# Patient Record
Sex: Male | Born: 1950 | Race: White | Hispanic: No | Smoking: Former smoker
Health system: Southern US, Community
[De-identification: ages and names within clinical notes are randomized; demographics above are authoritative.]

## PROBLEM LIST (undated history)

## (undated) DIAGNOSIS — C801 Malignant (primary) neoplasm, unspecified: Secondary | ICD-10-CM

## (undated) DIAGNOSIS — I10 Essential (primary) hypertension: Secondary | ICD-10-CM

## (undated) DIAGNOSIS — J439 Emphysema, unspecified: Secondary | ICD-10-CM

## (undated) DIAGNOSIS — K279 Peptic ulcer, site unspecified, unspecified as acute or chronic, without hemorrhage or perforation: Secondary | ICD-10-CM

## (undated) DIAGNOSIS — I712 Thoracic aortic aneurysm, without rupture, unspecified: Secondary | ICD-10-CM

## (undated) DIAGNOSIS — I639 Cerebral infarction, unspecified: Secondary | ICD-10-CM

## (undated) DIAGNOSIS — R251 Tremor, unspecified: Secondary | ICD-10-CM

## (undated) DIAGNOSIS — R0600 Dyspnea, unspecified: Secondary | ICD-10-CM

## (undated) DIAGNOSIS — K449 Diaphragmatic hernia without obstruction or gangrene: Secondary | ICD-10-CM

## (undated) DIAGNOSIS — D649 Anemia, unspecified: Secondary | ICD-10-CM

## (undated) DIAGNOSIS — M1990 Unspecified osteoarthritis, unspecified site: Secondary | ICD-10-CM

## (undated) DIAGNOSIS — I219 Acute myocardial infarction, unspecified: Secondary | ICD-10-CM

## (undated) DIAGNOSIS — K219 Gastro-esophageal reflux disease without esophagitis: Secondary | ICD-10-CM

## (undated) DIAGNOSIS — E538 Deficiency of other specified B group vitamins: Secondary | ICD-10-CM

## (undated) DIAGNOSIS — J449 Chronic obstructive pulmonary disease, unspecified: Secondary | ICD-10-CM

## (undated) DIAGNOSIS — F419 Anxiety disorder, unspecified: Secondary | ICD-10-CM

## (undated) DIAGNOSIS — I2510 Atherosclerotic heart disease of native coronary artery without angina pectoris: Secondary | ICD-10-CM

## (undated) HISTORY — PX: STENT REMOVAL: SHX6421

## (undated) HISTORY — PX: BACK SURGERY: SHX140

## (undated) HISTORY — PX: OTHER SURGICAL HISTORY: SHX169

## (undated) HISTORY — PX: CORONARY ANGIOPLASTY: SHX604

---

## 2003-12-04 ENCOUNTER — Ambulatory Visit (HOSPITAL_COMMUNITY): Admission: AD | Admit: 2003-12-04 | Discharge: 2003-12-04 | Payer: Self-pay | Admitting: Neurological Surgery

## 2004-01-26 ENCOUNTER — Ambulatory Visit (HOSPITAL_COMMUNITY): Admission: RE | Admit: 2004-01-26 | Discharge: 2004-01-26 | Payer: Self-pay | Admitting: Neurological Surgery

## 2004-03-07 ENCOUNTER — Inpatient Hospital Stay (HOSPITAL_COMMUNITY): Admission: RE | Admit: 2004-03-07 | Discharge: 2004-03-13 | Payer: Self-pay | Admitting: Neurological Surgery

## 2004-05-06 ENCOUNTER — Ambulatory Visit (HOSPITAL_COMMUNITY): Admission: RE | Admit: 2004-05-06 | Discharge: 2004-05-06 | Payer: Self-pay | Admitting: Neurological Surgery

## 2006-02-21 ENCOUNTER — Inpatient Hospital Stay: Payer: Self-pay | Admitting: Internal Medicine

## 2006-02-21 ENCOUNTER — Other Ambulatory Visit: Payer: Self-pay

## 2008-01-07 ENCOUNTER — Ambulatory Visit: Payer: Self-pay | Admitting: Unknown Physician Specialty

## 2008-01-25 ENCOUNTER — Ambulatory Visit: Payer: Self-pay | Admitting: Internal Medicine

## 2008-03-15 ENCOUNTER — Ambulatory Visit: Payer: Self-pay | Admitting: Internal Medicine

## 2008-07-19 ENCOUNTER — Ambulatory Visit: Payer: Self-pay | Admitting: Neurological Surgery

## 2009-02-08 ENCOUNTER — Ambulatory Visit: Payer: Self-pay | Admitting: Unknown Physician Specialty

## 2011-01-04 ENCOUNTER — Encounter: Payer: Self-pay | Admitting: Neurological Surgery

## 2011-01-07 ENCOUNTER — Ambulatory Visit (HOSPITAL_COMMUNITY)
Admission: RE | Admit: 2011-01-07 | Discharge: 2011-01-07 | Payer: Self-pay | Source: Home / Self Care | Attending: Neurological Surgery | Admitting: Neurological Surgery

## 2011-01-28 ENCOUNTER — Inpatient Hospital Stay (HOSPITAL_COMMUNITY)
Admission: RE | Admit: 2011-01-28 | Payer: PRIVATE HEALTH INSURANCE | Source: Ambulatory Visit | Admitting: Neurological Surgery

## 2011-04-18 ENCOUNTER — Encounter (HOSPITAL_COMMUNITY)
Admission: RE | Admit: 2011-04-18 | Discharge: 2011-04-18 | Disposition: A | Discharge status: Home / Self Care | Payer: PRIVATE HEALTH INSURANCE | Source: Ambulatory Visit | Attending: Neurological Surgery | Admitting: Neurological Surgery

## 2011-04-18 LAB — CBC
HCT: 48 % (ref 39.0–52.0)
Hemoglobin: 16.6 g/dL (ref 13.0–17.0)
MCH: 33.5 pg (ref 26.0–34.0)
MCHC: 34.6 g/dL (ref 30.0–36.0)
MCV: 97 fL (ref 78.0–100.0)
Platelets: 245 10*3/uL (ref 150–400)
RBC: 4.95 MIL/uL (ref 4.22–5.81)
RDW: 12.4 % (ref 11.5–15.5)
WBC: 8.7 10*3/uL (ref 4.0–10.5)

## 2011-04-18 LAB — BASIC METABOLIC PANEL
BUN: 12 mg/dL (ref 6–23)
CO2: 33 mEq/L — ABNORMAL HIGH (ref 19–32)
Calcium: 9.7 mg/dL (ref 8.4–10.5)
Chloride: 94 mEq/L — ABNORMAL LOW (ref 96–112)
Creatinine, Ser: 0.8 mg/dL (ref 0.4–1.5)
GFR calc Af Amer: >60 mL/min (ref >60)
GFR calc non Af Amer: >60 mL/min (ref >60)
Glucose, Bld: 97 mg/dL (ref 70–99)
Potassium: 5.1 mEq/L (ref 3.5–5.1)
Sodium: 132 mEq/L — ABNORMAL LOW (ref 135–145)

## 2011-04-18 LAB — SURGICAL PCR SCREEN
MRSA, PCR: NEGATIVE
Staphylococcus aureus: NEGATIVE

## 2011-04-24 ENCOUNTER — Ambulatory Visit (HOSPITAL_COMMUNITY)
Admission: RE | Admit: 2011-04-24 | Discharge: 2011-04-24 | Disposition: A | Discharge status: Home / Self Care | Payer: PRIVATE HEALTH INSURANCE | Source: Ambulatory Visit | Attending: Neurological Surgery | Admitting: Neurological Surgery

## 2011-04-24 ENCOUNTER — Other Ambulatory Visit (HOSPITAL_COMMUNITY): Payer: Self-pay | Admitting: Neurological Surgery

## 2011-04-24 ENCOUNTER — Inpatient Hospital Stay (HOSPITAL_COMMUNITY)
Admission: RE | Admit: 2011-04-24 | Discharge: 2011-05-09 | DRG: 459 | Disposition: A | Discharge status: Rehabilitation Facility | Payer: PRIVATE HEALTH INSURANCE | Source: Ambulatory Visit | Attending: Neurological Surgery | Admitting: Neurological Surgery

## 2011-04-24 ENCOUNTER — Inpatient Hospital Stay (HOSPITAL_COMMUNITY): Payer: PRIVATE HEALTH INSURANCE

## 2011-04-24 DIAGNOSIS — M4326 Fusion of spine, lumbar region: Secondary | ICD-10-CM

## 2011-04-24 DIAGNOSIS — I739 Peripheral vascular disease, unspecified: Secondary | ICD-10-CM | POA: Diagnosis present

## 2011-04-24 DIAGNOSIS — D62 Acute posthemorrhagic anemia: Secondary | ICD-10-CM | POA: Diagnosis not present

## 2011-04-24 DIAGNOSIS — J9600 Acute respiratory failure, unspecified whether with hypoxia or hypercapnia: Secondary | ICD-10-CM | POA: Diagnosis not present

## 2011-04-24 DIAGNOSIS — R5383 Other fatigue: Secondary | ICD-10-CM | POA: Diagnosis not present

## 2011-04-24 DIAGNOSIS — E871 Hypo-osmolality and hyponatremia: Secondary | ICD-10-CM | POA: Diagnosis present

## 2011-04-24 DIAGNOSIS — I10 Essential (primary) hypertension: Secondary | ICD-10-CM | POA: Diagnosis present

## 2011-04-24 DIAGNOSIS — M47817 Spondylosis without myelopathy or radiculopathy, lumbosacral region: Secondary | ICD-10-CM | POA: Diagnosis present

## 2011-04-24 DIAGNOSIS — R1310 Dysphagia, unspecified: Secondary | ICD-10-CM | POA: Diagnosis not present

## 2011-04-24 DIAGNOSIS — I2510 Atherosclerotic heart disease of native coronary artery without angina pectoris: Secondary | ICD-10-CM | POA: Diagnosis present

## 2011-04-24 DIAGNOSIS — F17200 Nicotine dependence, unspecified, uncomplicated: Secondary | ICD-10-CM | POA: Diagnosis present

## 2011-04-24 DIAGNOSIS — B3781 Candidal esophagitis: Secondary | ICD-10-CM | POA: Diagnosis not present

## 2011-04-24 DIAGNOSIS — R5381 Other malaise: Secondary | ICD-10-CM | POA: Diagnosis not present

## 2011-04-24 DIAGNOSIS — K208 Other esophagitis without bleeding: Secondary | ICD-10-CM | POA: Diagnosis present

## 2011-04-24 DIAGNOSIS — Z882 Allergy status to sulfonamides status: Secondary | ICD-10-CM

## 2011-04-24 DIAGNOSIS — K560 Paralytic ileus: Secondary | ICD-10-CM | POA: Diagnosis not present

## 2011-04-24 DIAGNOSIS — Z951 Presence of aortocoronary bypass graft: Secondary | ICD-10-CM

## 2011-04-24 DIAGNOSIS — R0602 Shortness of breath: Secondary | ICD-10-CM | POA: Diagnosis not present

## 2011-04-24 DIAGNOSIS — Y838 Other surgical procedures as the cause of abnormal reaction of the patient, or of later complication, without mention of misadventure at the time of the procedure: Secondary | ICD-10-CM | POA: Diagnosis not present

## 2011-04-24 DIAGNOSIS — Z981 Arthrodesis status: Secondary | ICD-10-CM

## 2011-04-24 DIAGNOSIS — K929 Disease of digestive system, unspecified: Secondary | ICD-10-CM | POA: Diagnosis not present

## 2011-04-24 DIAGNOSIS — R5082 Postprocedural fever: Secondary | ICD-10-CM | POA: Diagnosis not present

## 2011-04-24 DIAGNOSIS — B009 Herpesviral infection, unspecified: Secondary | ICD-10-CM | POA: Diagnosis present

## 2011-04-24 DIAGNOSIS — J189 Pneumonia, unspecified organism: Secondary | ICD-10-CM | POA: Diagnosis not present

## 2011-04-24 DIAGNOSIS — M48061 Spinal stenosis, lumbar region without neurogenic claudication: Principal | ICD-10-CM | POA: Diagnosis present

## 2011-04-24 DIAGNOSIS — K219 Gastro-esophageal reflux disease without esophagitis: Secondary | ICD-10-CM | POA: Diagnosis present

## 2011-04-24 DIAGNOSIS — J441 Chronic obstructive pulmonary disease with (acute) exacerbation: Secondary | ICD-10-CM | POA: Diagnosis not present

## 2011-04-24 HISTORY — DX: Essential (primary) hypertension: I10

## 2011-04-24 LAB — ABO/RH
ABO/RH(D): A POS
ABO/RH(D): A POS

## 2011-04-24 LAB — TYPE AND SCREEN
ABO/RH(D): A POS
Antibody Screen: NEGATIVE

## 2011-04-25 LAB — CBC
HCT: 29.1 % — ABNORMAL LOW (ref 39.0–52.0)
Hemoglobin: 9.9 g/dL — ABNORMAL LOW (ref 13.0–17.0)
MCH: 33.4 pg (ref 26.0–34.0)
MCHC: 34 g/dL (ref 30.0–36.0)
MCV: 98.3 fL (ref 78.0–100.0)
Platelets: 142 10*3/uL — ABNORMAL LOW (ref 150–400)
RBC: 2.96 MIL/uL — ABNORMAL LOW (ref 4.22–5.81)
RDW: 12.6 % (ref 11.5–15.5)
WBC: 8.5 10*3/uL (ref 4.0–10.5)

## 2011-04-25 LAB — BASIC METABOLIC PANEL
BUN: 8 mg/dL (ref 6–23)
CO2: 32 mEq/L (ref 19–32)
Calcium: 7.2 mg/dL — ABNORMAL LOW (ref 8.4–10.5)
Chloride: 96 mEq/L (ref 96–112)
Creatinine, Ser: 0.74 mg/dL (ref 0.4–1.5)
GFR calc Af Amer: >60 mL/min (ref >60)
GFR calc non Af Amer: >60 mL/min (ref >60)
Glucose, Bld: 113 mg/dL — ABNORMAL HIGH (ref 70–99)
Potassium: 4.4 mEq/L (ref 3.5–5.1)
Sodium: 130 mEq/L — ABNORMAL LOW (ref 135–145)

## 2011-04-25 LAB — POCT I-STAT 4, (NA,K, GLUC, HGB,HCT)
Glucose, Bld: 92 mg/dL (ref 70–99)
HCT: 34 % — ABNORMAL LOW (ref 39.0–52.0)
Sodium: 134 mEq/L — ABNORMAL LOW (ref 135–145)

## 2011-04-28 ENCOUNTER — Inpatient Hospital Stay (HOSPITAL_COMMUNITY): Payer: PRIVATE HEALTH INSURANCE

## 2011-04-28 LAB — BLOOD GAS, ARTERIAL
Acid-Base Excess: 5.8 mmol/L — ABNORMAL HIGH (ref 0.0–2.0)
O2 Content: 2 L/min
pCO2 arterial: 46.2 mmHg — ABNORMAL HIGH (ref 35.0–45.0)
pH, Arterial: 7.429 (ref 7.350–7.450)
pO2, Arterial: 64.6 mmHg — ABNORMAL LOW (ref 80.0–100.0)

## 2011-04-28 LAB — BASIC METABOLIC PANEL
CO2: 34 mEq/L — ABNORMAL HIGH (ref 19–32)
Calcium: 9 mg/dL (ref 8.4–10.5)
Chloride: 87 mEq/L — ABNORMAL LOW (ref 96–112)
Glucose, Bld: 116 mg/dL — ABNORMAL HIGH (ref 70–99)
Sodium: 126 mEq/L — ABNORMAL LOW (ref 135–145)

## 2011-04-28 LAB — URINALYSIS, MICROSCOPIC ONLY
Glucose, UA: NEGATIVE mg/dL
Hgb urine dipstick: NEGATIVE
Leukocytes, UA: NEGATIVE
Protein, ur: NEGATIVE mg/dL
Specific Gravity, Urine: 1.016 (ref 1.005–1.030)
pH: 7.5 (ref 5.0–8.0)

## 2011-04-28 LAB — DIFFERENTIAL
Basophils Absolute: 0 10*3/uL (ref 0.0–0.1)
Basophils Relative: 0 % (ref 0–1)
Eosinophils Relative: 0 % (ref 0–5)
Lymphocytes Relative: 12 % (ref 12–46)
Monocytes Absolute: 0.8 10*3/uL (ref 0.1–1.0)
Monocytes Relative: 13 % — ABNORMAL HIGH (ref 3–12)

## 2011-04-28 LAB — CBC
HCT: 28.8 % — ABNORMAL LOW (ref 39.0–52.0)
MCH: 33.4 pg (ref 26.0–34.0)
MCHC: 34.4 g/dL (ref 30.0–36.0)
RDW: 12.4 % (ref 11.5–15.5)

## 2011-04-28 LAB — CARDIAC PANEL(CRET KIN+CKTOT+MB+TROPI)
CK, MB: 4.2 ng/mL — ABNORMAL HIGH (ref 0.3–4.0)
Relative Index: INVALID (ref 0.0–2.5)
Troponin I: <0.3 ng/mL (ref <0.30)

## 2011-04-28 LAB — SODIUM, URINE, RANDOM: Sodium, Ur: 134 mEq/L

## 2011-04-28 LAB — PRO B NATRIURETIC PEPTIDE: Pro B Natriuretic peptide (BNP): 786.9 pg/mL — ABNORMAL HIGH (ref 0–125)

## 2011-04-29 ENCOUNTER — Inpatient Hospital Stay (HOSPITAL_COMMUNITY): Payer: PRIVATE HEALTH INSURANCE

## 2011-04-29 ENCOUNTER — Encounter (HOSPITAL_COMMUNITY): Payer: Self-pay | Admitting: Radiology

## 2011-04-29 LAB — CARDIAC PANEL(CRET KIN+CKTOT+MB+TROPI)
CK, MB: 5.9 ng/mL — ABNORMAL HIGH (ref 0.3–4.0)
Relative Index: INVALID (ref 0.0–2.5)
Total CK: 78 U/L (ref 7–232)
Total CK: 83 U/L (ref 7–232)

## 2011-04-29 LAB — BASIC METABOLIC PANEL
BUN: 11 mg/dL (ref 6–23)
CO2: 32 mEq/L (ref 19–32)
Glucose, Bld: 100 mg/dL — ABNORMAL HIGH (ref 70–99)
Potassium: 5.2 mEq/L — ABNORMAL HIGH (ref 3.5–5.1)
Sodium: 125 mEq/L — ABNORMAL LOW (ref 135–145)

## 2011-04-29 LAB — CBC
HCT: 30.4 % — ABNORMAL LOW (ref 39.0–52.0)
MCH: 33.2 pg (ref 26.0–34.0)
MCV: 96.2 fL (ref 78.0–100.0)
RDW: 12.4 % (ref 11.5–15.5)
WBC: 6.2 10*3/uL (ref 4.0–10.5)

## 2011-04-29 LAB — URINE CULTURE
Colony Count: 50000
Culture  Setup Time: 201205142240

## 2011-04-29 LAB — IRON AND TIBC
Saturation Ratios: 11 % — ABNORMAL LOW (ref 20–55)
TIBC: 286 ug/dL (ref 215–435)
UIBC: 254 ug/dL

## 2011-04-29 LAB — C-REACTIVE PROTEIN: CRP: 14.2 mg/dL — ABNORMAL HIGH (ref <0.6)

## 2011-04-30 DIAGNOSIS — I059 Rheumatic mitral valve disease, unspecified: Secondary | ICD-10-CM

## 2011-04-30 LAB — BASIC METABOLIC PANEL
BUN: 17 mg/dL (ref 6–23)
BUN: 21 mg/dL (ref 6–23)
Calcium: 9.6 mg/dL (ref 8.4–10.5)
Creatinine, Ser: 0.49 mg/dL (ref 0.4–1.5)
Creatinine, Ser: 0.56 mg/dL (ref 0.4–1.5)
GFR calc non Af Amer: >60 mL/min (ref >60)
GFR calc non Af Amer: >60 mL/min (ref >60)
Glucose, Bld: 83 mg/dL (ref 70–99)
Glucose, Bld: 120 mg/dL — ABNORMAL HIGH (ref 70–99)

## 2011-05-01 LAB — BASIC METABOLIC PANEL
BUN: 21 mg/dL (ref 6–23)
BUN: 20 mg/dL (ref 6–23)
CO2: 32 mEq/L (ref 19–32)
CO2: 32 mEq/L (ref 19–32)
Calcium: 8.9 mg/dL (ref 8.4–10.5)
Chloride: 83 mEq/L — ABNORMAL LOW (ref 96–112)
Chloride: 83 mEq/L — ABNORMAL LOW (ref 96–112)
Creatinine, Ser: 0.49 mg/dL (ref 0.4–1.5)
Creatinine, Ser: 0.51 mg/dL (ref 0.4–1.5)
GFR calc Af Amer: >60 mL/min (ref >60)
GFR calc Af Amer: >60 mL/min (ref >60)
Potassium: 5.1 mEq/L (ref 3.5–5.1)
Potassium: 5 mEq/L (ref 3.5–5.1)
Sodium: 121 mEq/L — ABNORMAL LOW (ref 135–145)
Sodium: 120 mEq/L — ABNORMAL LOW (ref 135–145)

## 2011-05-01 LAB — COMPREHENSIVE METABOLIC PANEL
AST: 19 U/L (ref 0–37)
BUN: 21 mg/dL (ref 6–23)
CO2: 29 mEq/L (ref 19–32)
Calcium: 9 mg/dL (ref 8.4–10.5)
Chloride: 83 mEq/L — ABNORMAL LOW (ref 96–112)
Creatinine, Ser: 0.5 mg/dL (ref 0.4–1.5)
GFR calc Af Amer: >60 mL/min (ref >60)
GFR calc non Af Amer: >60 mL/min (ref >60)
Total Bilirubin: 0.5 mg/dL (ref 0.3–1.2)

## 2011-05-01 LAB — CBC
MCHC: 35.2 g/dL (ref 30.0–36.0)
Platelets: 353 10*3/uL (ref 150–400)
RDW: 12.5 % (ref 11.5–15.5)
WBC: 8.3 10*3/uL (ref 4.0–10.5)

## 2011-05-01 LAB — CLOSTRIDIUM DIFFICILE BY PCR: Toxigenic C. Difficile by PCR: NEGATIVE

## 2011-05-02 ENCOUNTER — Inpatient Hospital Stay (HOSPITAL_COMMUNITY): Payer: PRIVATE HEALTH INSURANCE

## 2011-05-02 LAB — BASIC METABOLIC PANEL
BUN: 15 mg/dL (ref 6–23)
CO2: 32 mEq/L (ref 19–32)
CO2: 29 mEq/L (ref 19–32)
Calcium: 8.9 mg/dL (ref 8.4–10.5)
Calcium: 8.6 mg/dL (ref 8.4–10.5)
Chloride: 85 mEq/L — ABNORMAL LOW (ref 96–112)
Creatinine, Ser: 0.49 mg/dL (ref 0.4–1.5)
Creatinine, Ser: <0.47 mg/dL (ref 0.4–1.5)
GFR calc Af Amer: >60 mL/min (ref >60)
GFR calc non Af Amer: >60 mL/min (ref >60)
Glucose, Bld: 105 mg/dL — ABNORMAL HIGH (ref 70–99)
Glucose, Bld: 104 mg/dL — ABNORMAL HIGH (ref 70–99)
Sodium: 122 mEq/L — ABNORMAL LOW (ref 135–145)

## 2011-05-02 LAB — CBC
HCT: 29.6 % — ABNORMAL LOW (ref 39.0–52.0)
Hemoglobin: 10.3 g/dL — ABNORMAL LOW (ref 13.0–17.0)
MCH: 33.3 pg (ref 26.0–34.0)
MCV: 95.8 fL (ref 78.0–100.0)
Platelets: 342 10*3/uL (ref 150–400)
RBC: 3.09 MIL/uL — ABNORMAL LOW (ref 4.22–5.81)
WBC: 9.6 10*3/uL (ref 4.0–10.5)

## 2011-05-02 LAB — CARDIAC PANEL(CRET KIN+CKTOT+MB+TROPI)
Relative Index: INVALID (ref 0.0–2.5)
Troponin I: <0.3 ng/mL (ref <0.30)

## 2011-05-02 LAB — COMPREHENSIVE METABOLIC PANEL
Albumin: 2.7 g/dL — ABNORMAL LOW (ref 3.5–5.2)
Alkaline Phosphatase: 76 U/L (ref 39–117)
BUN: 15 mg/dL (ref 6–23)
CO2: 33 mEq/L — ABNORMAL HIGH (ref 19–32)
Chloride: 83 mEq/L — ABNORMAL LOW (ref 96–112)
Creatinine, Ser: 0.48 mg/dL (ref 0.4–1.5)
GFR calc non Af Amer: >60 mL/min (ref >60)
Potassium: 4.9 mEq/L (ref 3.5–5.1)
Total Bilirubin: 0.5 mg/dL (ref 0.3–1.2)

## 2011-05-02 LAB — HEPATIC FUNCTION PANEL
Alkaline Phosphatase: 76 U/L (ref 39–117)
Indirect Bilirubin: 0.4 mg/dL (ref 0.3–0.9)
Total Bilirubin: 0.5 mg/dL (ref 0.3–1.2)

## 2011-05-02 NOTE — Op Note (Signed)
NAME:  Ryan Jarvis, Ryan Jarvis                         ACCOUNT NO.:  [id number]   MEDICAL RECORD NO.:  [id number]                   PATIENT TYPE:  INP   LOCATION:  3039                                 FACILITY:  [facility]   PHYSICIAN:  [name], M.D.               DATE OF BIRTH:  11-Nov-1951   DATE OF PROCEDURE:  03/07/2004  DATE OF DISCHARGE:  03/13/2004                                 OPERATIVE REPORT   PREOPERATIVE DIAGNOSIS:  Spondylosis, L4-L5 with back pain, lumbar  radiculopathy.   POSTOPERATIVE DIAGNOSIS:  Spondylosis, L4-L5 with back pain, lumbar  radiculopathy.   OPERATION PERFORMED:  L4-L5 anterior lumbar interbody arthrodesis with  structural allograft and anterior plate fixation.   SURGEON:  [name], M.D.   APPROACH:  Adolph Pollack, M.D.   ANESTHESIA:  General endotracheal.   INDICATIONS FOR PROCEDURE:  The patient is a 60 year old individual who has  had significant back and leg pain.  He has been treated conservatively.  Having failed extensive efforts at this method of treatment he has been  advised regarding surgical decompression and arthrodesis via an anterior  approach secondary to severe degenerative changes at L4-L5.   DESCRIPTION OF PROCEDURE:  The patient was in the operating room and having  been positioned supine on the table and having localized his L4-L5 space  preoperatively, an approach was performed by Adolph Pollack, M.D.  Ultimately, it was ascertained that the L4-L5 space was identified  positively with a radiograph and a marker within the disk space itself. Senn  frame retractor had been used to retract his aortic bifurcation and the vena  caval bifurcation off to the right side.  Prevertebral vessels were cleared  and the disk space was entered at L4-L5.  Diskectomy was performed by using  a combination of curets and rongeurs to remove severely degenerated disk  material from within the space.  As the region of the posterior  longitudinal  ligament was reached, care was taken to dissect this area carefully and  using a combination of curets and rongeurs, the area was carefully dissected  back leaving a thin layer of  posterior longitudinal ligament intact.  Bony  osteophytes were removed from the inferior margin of the body of L4 and the  superior margin of the body of L4.  After adequate decompression was  obtained, decortication was undertaken and then the disk space was sized for  appropriate size prosthesis.  The Synthes femoral ring allograft was used in  lordotic configuration approximately 5 degrees and it was sized to 13 mm  size.  Once the graft was sized appropriately, it was packed with InFuse  bone and a small amount of demineralized bone matrix that was packed into  the center.  The graft was then placed and tamped into the posterior space  and recessed approximately 3 to 4 mm.  A standard size anterior plate from  the Synthes hardware set was then used.  This was fitted over the  prevertebral space with four locking 28 mm screws.  Radiographic  confirmation was taken of the  plate showing it to be nicely apposed in the midline.  Final radiographs  were obtained and then the procedure was turned back over to Adolph Pollack, M.D. for final closure of the fascia.  The patient tolerated the  procedure well during the dissection portion of the procedure.  Blood loss  was estimated at about 200 mL.                                               [name], M.D.    Merla Riches  D:  03/21/2004  T:  03/21/2004  Job:  [id number]

## 2011-05-02 NOTE — Op Note (Signed)
NAME:  Ryan Jarvis, Ryan Jarvis                         ACCOUNT NO.:  [id number]   MEDICAL RECORD NO.:  [id number]                   PATIENT TYPE:  INP   LOCATION:  3039                                 FACILITY:  [facility]   PHYSICIAN:  Adolph Pollack, M.D.            DATE OF BIRTH:  Jun 03, 1951   DATE OF PROCEDURE:  03/07/2004  DATE OF DISCHARGE:                                 OPERATIVE REPORT   PREOPERATIVE DIAGNOSIS:  L4-L5 degenerative disc disease and spondylosis.   POSTOPERATIVE DIAGNOSIS:  L4-L5 degenerative disc disease and spondylosis.   PROCEDURE:  Anterior approach to the lumbar spine.   SURGEON:  Adolph Pollack, M.D.   ASSISTANT:  Velora Heckler, M.D.   CO-SURGEON:  [name], M.D.   INDICATIONS FOR PROCEDURE:  [name] is having progressively increasing  back pain from degenerative disc disease and spondylosis.  An anterior  lumbar interbody fusion is planned and I have planned to perform an anterior  approach.  The procedure and the risks were explained preoperatively.  Of  note was that he has some cool lower extremities from the knees down to the  feet, apparently this is chronic for him.   SURGICAL TECHNIQUE:  He was placed supine on the operating table and a  general anesthetic was administered.  The lower abdominal wall was clipped  and sterilely prepped and draped.  An oblique incision was made in the  periumbilical region on the left side through the skin and subcutaneous  tissue.  The anterior rectus sheath was incised for the length of the  incision and the rectus muscle was then swept laterally.  Using blunt  dissection, I dissected the peritoneum free from the posterior rectus sheath  and incised this superiorly.  I then used blunt dissection to enter the  retroperitoneal space on the left and retract the ureter and the  retroperitoneal contents toward the midline.  I exposed the left iliac  artery and vein, dissected and mobilized them, retracted  them over to the  right exposing the L3-L4 disc space and the L4-L5 disc space.  The L3 lumbar  vein was clipped and divided.  Electrocautery was used to dissect the  fibrous tissue over the vertebral bodies to allow for adequate retraction.  A self-retaining retractor was then used.   Dr. Danielle Dess then proceeded with his operation which will be dictated  separately.   Following Dr. Verlee Rossetti operation, I examined the vessels and the ureter and  no damage was noted.  The sympathetic chain was unharmed.  Hemostasis was  adequate.  I then closed the posterior sheath with running 0 PDS suture.  The anterior rectus sheath was closed with #1 PDS suture.  The subcutaneous  tissue was irrigated and the skin was closed with staples.  He tolerated the  procedure well without any apparent complication and was taken to the  recovery room in satisfactory good condition.  Adolph Pollack, M.D.   Kari Baars  D:  03/07/2004  T:  03/08/2004  Job:  [id number]

## 2011-05-02 NOTE — Discharge Summary (Signed)
NAME:  OZIEL, BEITLER                         ACCOUNT NO.:  [id number]   MEDICAL RECORD NO.:  [id number]                   PATIENT TYPE:  INP   LOCATION:  3039                                 FACILITY:  [facility]   PHYSICIAN:  [name], M.D.               DATE OF BIRTH:  08/09/1951   DATE OF ADMISSION:  03/07/2004  DATE OF DISCHARGE:  03/13/2004                                 DISCHARGE SUMMARY   ADMISSION DIAGNOSIS:  Lumbar spondylosis with radiculopathy and lumbago.   DISCHARGE DIAGNOSES:  1. Lumbar spondylosis with radiculopathy and lumbago.  2. Chronic obstructive pulmonary disease.  3. Chronic bronchitis.  4. Tobacco abuse.   CONDITION ON DISCHARGE:  Improving.   HOSPITAL COURSE:  Mr. Fosdick is a 60 year old individual who has had  significant problems with back pain and lumbar degenerative changes.  He has  been refractory to conservative management and ultimately was advised  regarding surgical decompression and stabilization of the level of L4-5 via  an anterior approach.  He was taken to surgery on March 07, 2004, where he  underwent an anterior approach to the lumbar spine via an interbody  arthrodesis with plate fixation.  He tolerated the procedure well, however,  he did have some pulmonary congestion during the postoperative period and he  required increased pulmonary toilet secondary to a history of chronic  bronchitis.  He also developed some GI upset secondary to gastrointestinal  reflux disease.  However, after appropriate medications and stimulation of  his bowels, it seemed to resolve on its own.  At the current time, he is  tolerating ambulating independently.  His incision is clean and dry.  His  back pain is decreased.  He is tolerating Vicodin as an oral pain  medication.  He is discharged home with a prescription for #60 of these  without refills.  He will be seen in the office on March 22, 2004, for  further followup.  He will be seen prior to that by  Dr. Abbey Chatters regarding  suture and staple removal.   CONDITION ON DISCHARGE:  Improving.                                                [name], M.D.    Merla Riches  D:  03/13/2004  T:  03/13/2004  Job:  [id number]

## 2011-05-03 LAB — RENAL FUNCTION PANEL
CO2: 27 mEq/L (ref 19–32)
Chloride: 82 mEq/L — ABNORMAL LOW (ref 96–112)
Sodium: 120 mEq/L — ABNORMAL LOW (ref 135–145)

## 2011-05-03 LAB — CBC
HCT: 26.8 % — ABNORMAL LOW (ref 39.0–52.0)
Hemoglobin: 9.4 g/dL — ABNORMAL LOW (ref 13.0–17.0)
MCV: 94.4 fL (ref 78.0–100.0)
Platelets: 353 10*3/uL (ref 150–400)
RBC: 2.84 MIL/uL — ABNORMAL LOW (ref 4.22–5.81)
WBC: 11.4 10*3/uL — ABNORMAL HIGH (ref 4.0–10.5)

## 2011-05-03 LAB — BASIC METABOLIC PANEL
CO2: 28 mEq/L (ref 19–32)
Chloride: 84 mEq/L — ABNORMAL LOW (ref 96–112)
Sodium: 120 mEq/L — ABNORMAL LOW (ref 135–145)

## 2011-05-03 LAB — CARDIAC PANEL(CRET KIN+CKTOT+MB+TROPI): Troponin I: <0.3 ng/mL (ref <0.30)

## 2011-05-04 ENCOUNTER — Inpatient Hospital Stay (HOSPITAL_COMMUNITY): Payer: PRIVATE HEALTH INSURANCE

## 2011-05-04 DIAGNOSIS — R509 Fever, unspecified: Secondary | ICD-10-CM

## 2011-05-04 DIAGNOSIS — J984 Other disorders of lung: Secondary | ICD-10-CM

## 2011-05-04 DIAGNOSIS — R1310 Dysphagia, unspecified: Secondary | ICD-10-CM

## 2011-05-04 LAB — CARDIAC PANEL(CRET KIN+CKTOT+MB+TROPI)
CK, MB: 2.2 ng/mL (ref 0.3–4.0)
CK, MB: 1.6 ng/mL (ref 0.3–4.0)
Total CK: 33 U/L (ref 7–232)
Troponin I: <0.3 ng/mL (ref <0.30)

## 2011-05-04 LAB — DIFFERENTIAL
Basophils Relative: 0 % (ref 0–1)
Eosinophils Absolute: 0 10*3/uL (ref 0.0–0.7)
Eosinophils Relative: 0 % (ref 0–5)
Lymphs Abs: 0.9 10*3/uL (ref 0.7–4.0)
Lymphs Abs: 1 10*3/uL (ref 0.7–4.0)
Monocytes Absolute: 1.2 10*3/uL — ABNORMAL HIGH (ref 0.1–1.0)
Monocytes Relative: 8 % (ref 3–12)
Monocytes Relative: 6 % (ref 3–12)
Neutro Abs: 11.8 10*3/uL — ABNORMAL HIGH (ref 1.7–7.7)
Neutrophils Relative %: 86 % — ABNORMAL HIGH (ref 43–77)
Neutrophils Relative %: 87 % — ABNORMAL HIGH (ref 43–77)

## 2011-05-04 LAB — COMPREHENSIVE METABOLIC PANEL
AST: 20 U/L (ref 0–37)
Alkaline Phosphatase: 94 U/L (ref 39–117)
CO2: 27 mEq/L (ref 19–32)
Chloride: 88 mEq/L — ABNORMAL LOW (ref 96–112)
Creatinine, Ser: <0.47 mg/dL (ref 0.4–1.5)
Potassium: 3.8 mEq/L (ref 3.5–5.1)
Total Bilirubin: 0.3 mg/dL (ref 0.3–1.2)

## 2011-05-04 LAB — BLOOD GAS, ARTERIAL
Bicarbonate: 28.5 mEq/L — ABNORMAL HIGH (ref 20.0–24.0)
TCO2: 29.6 mmol/L (ref 0–100)
pCO2 arterial: 37.9 mmHg (ref 35.0–45.0)
pH, Arterial: 7.489 — ABNORMAL HIGH (ref 7.350–7.450)
pO2, Arterial: 65 mmHg — ABNORMAL LOW (ref 80.0–100.0)

## 2011-05-04 LAB — CBC
Hemoglobin: 8.3 g/dL — ABNORMAL LOW (ref 13.0–17.0)
Hemoglobin: 8.3 g/dL — ABNORMAL LOW (ref 13.0–17.0)
MCH: 32.2 pg (ref 26.0–34.0)
MCH: 32 pg (ref 26.0–34.0)
MCV: 94.6 fL (ref 78.0–100.0)
MCV: 94.2 fL (ref 78.0–100.0)
Platelets: 319 10*3/uL (ref 150–400)
RBC: 2.58 MIL/uL — ABNORMAL LOW (ref 4.22–5.81)
RBC: 2.59 MIL/uL — ABNORMAL LOW (ref 4.22–5.81)
WBC: 13.6 10*3/uL — ABNORMAL HIGH (ref 4.0–10.5)

## 2011-05-04 LAB — BASIC METABOLIC PANEL
BUN: 7 mg/dL (ref 6–23)
Chloride: 86 mEq/L — ABNORMAL LOW (ref 96–112)
Glucose, Bld: 101 mg/dL — ABNORMAL HIGH (ref 70–99)
Potassium: 3.7 mEq/L (ref 3.5–5.1)
Sodium: 125 mEq/L — ABNORMAL LOW (ref 135–145)

## 2011-05-04 LAB — MRSA PCR SCREENING: MRSA by PCR: POSITIVE — AB

## 2011-05-04 LAB — LACTIC ACID, PLASMA: Lactic Acid, Venous: 1 mmol/L (ref 0.5–2.2)

## 2011-05-04 LAB — SODIUM: Sodium: 123 mEq/L — ABNORMAL LOW (ref 135–145)

## 2011-05-04 LAB — EXPECTORATED SPUTUM ASSESSMENT W GRAM STAIN, RFLX TO RESP C

## 2011-05-04 LAB — PROCALCITONIN: Procalcitonin: 0.2 ng/mL

## 2011-05-05 ENCOUNTER — Inpatient Hospital Stay (HOSPITAL_COMMUNITY): Payer: PRIVATE HEALTH INSURANCE

## 2011-05-05 DIAGNOSIS — R4182 Altered mental status, unspecified: Secondary | ICD-10-CM

## 2011-05-05 DIAGNOSIS — J984 Other disorders of lung: Secondary | ICD-10-CM

## 2011-05-05 DIAGNOSIS — R1310 Dysphagia, unspecified: Secondary | ICD-10-CM

## 2011-05-05 DIAGNOSIS — R1319 Other dysphagia: Secondary | ICD-10-CM

## 2011-05-05 DIAGNOSIS — R000 Tachycardia, unspecified: Secondary | ICD-10-CM

## 2011-05-05 LAB — BASIC METABOLIC PANEL
Calcium: 8.2 mg/dL — ABNORMAL LOW (ref 8.4–10.5)
Sodium: 125 mEq/L — ABNORMAL LOW (ref 135–145)

## 2011-05-05 LAB — CBC
HCT: 24.4 % — ABNORMAL LOW (ref 39.0–52.0)
Hemoglobin: 8.5 g/dL — ABNORMAL LOW (ref 13.0–17.0)
MCHC: 34.8 g/dL (ref 30.0–36.0)
RBC: 2.6 MIL/uL — ABNORMAL LOW (ref 4.22–5.81)

## 2011-05-05 LAB — DIFFERENTIAL
Basophils Absolute: 0 10*3/uL (ref 0.0–0.1)
Lymphocytes Relative: 6 % — ABNORMAL LOW (ref 12–46)
Monocytes Absolute: 1.3 10*3/uL — ABNORMAL HIGH (ref 0.1–1.0)
Neutro Abs: 16.3 10*3/uL — ABNORMAL HIGH (ref 1.7–7.7)
Neutrophils Relative %: 87 % — ABNORMAL HIGH (ref 43–77)

## 2011-05-05 LAB — CARDIAC PANEL(CRET KIN+CKTOT+MB+TROPI)
CK, MB: 1.6 ng/mL (ref 0.3–4.0)
Relative Index: INVALID (ref 0.0–2.5)
Total CK: 26 U/L (ref 7–232)

## 2011-05-06 ENCOUNTER — Other Ambulatory Visit: Payer: Self-pay | Admitting: Internal Medicine

## 2011-05-06 DIAGNOSIS — R1310 Dysphagia, unspecified: Secondary | ICD-10-CM

## 2011-05-06 DIAGNOSIS — K209 Esophagitis, unspecified: Secondary | ICD-10-CM

## 2011-05-06 DIAGNOSIS — J411 Mucopurulent chronic bronchitis: Secondary | ICD-10-CM

## 2011-05-06 DIAGNOSIS — K449 Diaphragmatic hernia without obstruction or gangrene: Secondary | ICD-10-CM

## 2011-05-06 DIAGNOSIS — R1319 Other dysphagia: Secondary | ICD-10-CM

## 2011-05-06 DIAGNOSIS — J984 Other disorders of lung: Secondary | ICD-10-CM

## 2011-05-06 LAB — DIFFERENTIAL
Basophils Absolute: 0 10*3/uL (ref 0.0–0.1)
Lymphocytes Relative: 15 % (ref 12–46)
Lymphs Abs: 1.5 10*3/uL (ref 0.7–4.0)
Neutrophils Relative %: 75 % (ref 43–77)

## 2011-05-06 LAB — CULTURE, RESPIRATORY W GRAM STAIN

## 2011-05-06 LAB — CBC
HCT: 23.3 % — ABNORMAL LOW (ref 39.0–52.0)
MCV: 93.6 fL (ref 78.0–100.0)
Platelets: 333 10*3/uL (ref 150–400)
RBC: 2.49 MIL/uL — ABNORMAL LOW (ref 4.22–5.81)
WBC: 9.6 10*3/uL (ref 4.0–10.5)

## 2011-05-06 LAB — PREPARE RBC (CROSSMATCH)

## 2011-05-06 LAB — RENAL FUNCTION PANEL
Albumin: 2.1 g/dL — ABNORMAL LOW (ref 3.5–5.2)
BUN: 9 mg/dL (ref 6–23)
Chloride: 92 mEq/L — ABNORMAL LOW (ref 96–112)
Glucose, Bld: 106 mg/dL — ABNORMAL HIGH (ref 70–99)
Phosphorus: 3.3 mg/dL (ref 2.3–4.6)
Potassium: 3.4 mEq/L — ABNORMAL LOW (ref 3.5–5.1)
Sodium: 127 mEq/L — ABNORMAL LOW (ref 135–145)

## 2011-05-07 DIAGNOSIS — R509 Fever, unspecified: Secondary | ICD-10-CM

## 2011-05-07 DIAGNOSIS — K209 Esophagitis, unspecified: Secondary | ICD-10-CM

## 2011-05-07 LAB — TYPE AND SCREEN: Unit division: 0

## 2011-05-07 LAB — CBC
MCV: 90.1 fL (ref 78.0–100.0)
Platelets: 359 10*3/uL (ref 150–400)
RBC: 3.35 MIL/uL — ABNORMAL LOW (ref 4.22–5.81)
RDW: 15 % (ref 11.5–15.5)
WBC: 9.5 10*3/uL (ref 4.0–10.5)

## 2011-05-07 LAB — COMPREHENSIVE METABOLIC PANEL
ALT: 67 U/L — ABNORMAL HIGH (ref 0–53)
AST: 61 U/L — ABNORMAL HIGH (ref 0–37)
Albumin: 2.1 g/dL — ABNORMAL LOW (ref 3.5–5.2)
Alkaline Phosphatase: 90 U/L (ref 39–117)
BUN: 7 mg/dL (ref 6–23)
Chloride: 91 mEq/L — ABNORMAL LOW (ref 96–112)
Potassium: 4.3 mEq/L (ref 3.5–5.1)
Sodium: 126 mEq/L — ABNORMAL LOW (ref 135–145)
Total Bilirubin: 0.5 mg/dL (ref 0.3–1.2)
Total Protein: 4.9 g/dL — ABNORMAL LOW (ref 6.0–8.3)

## 2011-05-07 NOTE — Progress Notes (Signed)
Quick Note:  Seen at hospital No letter or recall ______

## 2011-05-08 DIAGNOSIS — K209 Esophagitis, unspecified without bleeding: Secondary | ICD-10-CM

## 2011-05-08 DIAGNOSIS — IMO0002 Reserved for concepts with insufficient information to code with codable children: Secondary | ICD-10-CM

## 2011-05-08 DIAGNOSIS — M47817 Spondylosis without myelopathy or radiculopathy, lumbosacral region: Secondary | ICD-10-CM

## 2011-05-08 DIAGNOSIS — R1319 Other dysphagia: Secondary | ICD-10-CM

## 2011-05-08 LAB — COMPREHENSIVE METABOLIC PANEL
ALT: 61 U/L — ABNORMAL HIGH (ref 0–53)
BUN: 7 mg/dL (ref 6–23)
CO2: 27 mEq/L (ref 19–32)
Calcium: 8.6 mg/dL (ref 8.4–10.5)
Creatinine, Ser: <0.47 mg/dL (ref 0.4–1.5)
Glucose, Bld: 91 mg/dL (ref 70–99)
Sodium: 121 mEq/L — ABNORMAL LOW (ref 135–145)
Total Protein: 5.2 g/dL — ABNORMAL LOW (ref 6.0–8.3)

## 2011-05-08 LAB — CULTURE, BLOOD (ROUTINE X 2)
Culture  Setup Time: 201205181630
Culture: NO GROWTH

## 2011-05-08 NOTE — Consult Note (Signed)
Ryan Jarvis, Ryan Jarvis               ACCOUNT NO.:  [id number]  MEDICAL RECORD NO.:  [id number]           PATIENT TYPE:  I  LOCATION:  3041                         FACILITY:  [facility]  PHYSICIAN:  Jeoffrey Massed, MD    DATE OF BIRTH:  08/06/1951  DATE OF CONSULTATION:  04/28/2011 DATE OF DISCHARGE:                                CONSULTATION   PRIMARY CARE PRACTITIONER:  Dr. [name] in [facility] in [place].  PRIMARY GI DOCTOR:  Dr. Mechele Collin in [facility] in [place].  PRIMARY CARDIOLOGIST:  Dr. Chales Abrahams in [facility].  REQUESTING PHYSICIAN:  [name], MD  REASON FOR CONSULTATION:  Shortness of breath, lethargy, and general decline in function following back surgery.  HISTORY OF PRESENT ILLNESS:  The patient is a 60 year old Caucasian male with a past medical history of ongoing tobacco abuse prior to this admission, history of COPD, history of coronary artery disease status post PTCA, history of hypertension, and peripheral vascular disease was admitted to the Neurosurgical Service by Dr. Danielle Dess for laminectomy and decompression on Apr 24, 2011.  Apparently, he did well on the first postoperative day of surgery and was able to ambulate and had an okay appetite.  Ever since then, the patient has had decline in his general level of functioning.  His wife claims that he has excessive sleepiness during the daytime and is fatigued and lethargic all the time.  He has very poor p.o. intake as well.  The patient claims that he is slightly short of breath and has a productive cough.  He has had no nausea, no vomiting.  He does not have abdominal pain or chest pain.  He did have a bowel movement today.  The patient just claims that he feels tired all the time and does not have the urge or the appetite to eat or move around, but the patient noticed the patient also does not have diarrhea. The patient denies any dysuria as well.  ALLERGIES:   The patient is allergic to sulfa medications and belladonna.  PAST MEDICAL HISTORY: 1. Coronary artery disease status post PTCA in the 1990s. 2. Hypertension. 3. Dyslipidemia. 4. Gastroesophageal reflux disease. 5. Ongoing tobacco abuse. 6. COPD. 7. Peripheral vascular disease.  PAST SURGICAL HISTORY:  Prior back surgery and a very recent back surgery this admission.  MEDICATIONS:  Prior to admission include the following, 1. Tramadol 50 mg 2 tablets twice daily. 2. Simvastatin 40 mg 1 tablet daily. 3. Prilosec 20 mg 1 capsule twice daily. 4. Lopressor 50 mg 1 tablet every morning. 5. Mucinex 2 tablets p.o. daily p.r.n. 6. Aspirin 81 mg 1 tablet daily.  FAMILY HISTORY:  Both heart disease and liver cancer in his parents.  SOCIAL HISTORY:  Ongoing tobacco abuse prior to this admission.  Has been smoking for at least 40-50 years.  Currently smokes half a pack of cigarettes a day.  Prior to this, he was smoking close to one pack a day.  REVIEW OF SYSTEMS:  A detailed review of 12 systems were done and these are negative except for the ones mentioned in the HPI.  PHYSICAL EXAM:  VITAL SIGNS:  Temperature of 98.3, heart rate of 91, respirations of 19, BP of 172/96, pulse ox of 96% on 2 liters. GENERAL:  Lying in bed, lethargic, but not in any respiratory distress. He is easily able to talk in full sentences and not using any accessory muscles of respiration. HEENT:  Atraumatic, normocephalic.  Pupils equally reactive to light and accommodation. NECK:  Supple.  No JVD seen.  Oral mucosa is slightly on dry side. CHEST:  There is decreased air entry especially at both bases bilaterally and the patient has prolonged expiration.  He also has rhonchi almost heard in the upper zones bilaterally.  No rales were heard. CARDIOVASCULAR:  Heart sounds are regular.  No murmurs heard. ABDOMEN:  Soft, nontender, nondistended. EXTREMITIES:  There is no edema.  The patient has some slight  erythema on his right knee without increase in warmth.  There is no swelling. The patient has very feeble dorsalis pedis pulses.  The wife claims that he has peripheral vascular disease and his toe is always cold. NEUROLOGIC:  The patient is awake and alert and has no focal neurological deficits.  LABORATORY DATA: 1. CBC shows WBC of 5.7, hemoglobin of 9.9, hematocrit of 28.8, MCV of     97.3, and platelet count of 196. 2. Chemistries show sodium of 126, potassium of 4.8, chloride of 87,     bicarb of 34, glucose of 116, BUN of 10, creatinine of 0.47, and     calcium of 9.0. 3. Four set of cardiac markers are negative. 4. ESR is 70.  RADIOLOGICAL STUDIES: 1. X-ray of the chest shows postoperative bibasilar atelectasis.  No     consolidation or other acute findings identified. 2. EKG shows normal sinus rhythm with mild LVH.  ASSESSMENT: 1. General decline in functioning with lethargy following the surgery.     We will need to watch closely for a developing infection     particularly pneumonia or urinary tract infection.  Currently, the     chest x-ray is negative for pneumonia.  The patient is also     afebrile and does not have a toxic appearance.  Urinalysis is     currently pending. 2. Shortness of breath with wheezing.  He is likely having a flare of     his underlying chronic obstructive pulmonary disease/bronchitis. 3. Hyponatremia.  This appears chronic on review of his previous labs     in eChart.  However, it has never been this low.  At this point, we     will order a workup and monitor it.  Possibly this is all secondary     to volume depletion as the patient has not had any good oral intake     for the past few days. 4. Anemia possibly secondary to blood loss following surgery and also     some amount of critical illness.  We will monitor his hemoglobin     and send out an anemia panel. 5. History of hypertension.  Blood pressures are started to creep up.     We will  continue Lopressor with caution and use hydralazine for now     on a p.r.n. basis.  Given his bronchospasm, we will be very     cautious with Lopressor and if needed we will add more     antihypertensive medication. 6. Gastroesophageal reflux disease.  We will continue his PPI. 7. Deconditioning following back surgery and possible underlying  infection.  PLAN: 1. The patient will be given aggressive pulmonary toileting with     incentive spirometry every 1 or 2 hours while he is awake. 2. We will begin nebulized bronchodilators q.4 h. 3. We will check BMP and a 2-D echocardiogram. 4. The patient does not have fever or elevated WBC count and does not     have pneumonia on his chest x-ray, so we will not yet start him on     antibiotics until we have an obvious foci. 5. We will send a urinalysis and if that is suggestive of UTI, we will     start him on antibiotics and send a urine for culture as well. 6. We will send a urine sodium, urine osmolality, and serum osmolality     and his BNP is not significantly elevated, we will gently hydrate     him to see if his hyponatremia response to fluids or not. 7. We will cycle his cardiac enzymes. 8. We will as noted above use hydralazine on a p.r.n. basis and     continue Lopressor for now. 9. We will continue PPI for gastroesophageal reflux disease. 10.This patient needs to be mobilized.  Physical Therapy and     Occupational Therapy consults have already been ordered. 11.He will need to be out of bed onto chair on a daily basis. 12.He has received numerous narcotics and has just had a bowel     movement.  We will go ahead and put him on Senokot and MiraLax for     now. 13.We will obtain records from his primary care practitioner's office.  Thank you for consultation.  We will follow along with you.  Total time spent 45 minutes.     Jeoffrey Massed, MD     SG/MEDQ  D:  04/28/2011  T:  04/28/2011  Job:  [id number]  cc:   Dr.  Chales Abrahams Dr. Mechele Collin Dr. [name]  Electronically Signed by Jeoffrey Massed  on 05/08/2011 02:29:12 PM

## 2011-05-09 ENCOUNTER — Inpatient Hospital Stay (HOSPITAL_COMMUNITY)
Admission: AD | Admit: 2011-05-09 | Discharge: 2011-05-14 | DRG: 945 | Disposition: A | Discharge status: Home Health | Payer: PRIVATE HEALTH INSURANCE | Source: Ambulatory Visit | Attending: Physical Medicine & Rehabilitation | Admitting: Physical Medicine & Rehabilitation

## 2011-05-09 ENCOUNTER — Inpatient Hospital Stay (HOSPITAL_COMMUNITY): Payer: PRIVATE HEALTH INSURANCE

## 2011-05-09 DIAGNOSIS — I2510 Atherosclerotic heart disease of native coronary artery without angina pectoris: Secondary | ICD-10-CM

## 2011-05-09 DIAGNOSIS — I10 Essential (primary) hypertension: Secondary | ICD-10-CM

## 2011-05-09 DIAGNOSIS — D62 Acute posthemorrhagic anemia: Secondary | ICD-10-CM

## 2011-05-09 DIAGNOSIS — D649 Anemia, unspecified: Secondary | ICD-10-CM

## 2011-05-09 DIAGNOSIS — J4489 Other specified chronic obstructive pulmonary disease: Secondary | ICD-10-CM

## 2011-05-09 DIAGNOSIS — I739 Peripheral vascular disease, unspecified: Secondary | ICD-10-CM

## 2011-05-09 DIAGNOSIS — Z7982 Long term (current) use of aspirin: Secondary | ICD-10-CM

## 2011-05-09 DIAGNOSIS — B009 Herpesviral infection, unspecified: Secondary | ICD-10-CM

## 2011-05-09 DIAGNOSIS — B0089 Other herpesviral infection: Secondary | ICD-10-CM

## 2011-05-09 DIAGNOSIS — F17200 Nicotine dependence, unspecified, uncomplicated: Secondary | ICD-10-CM

## 2011-05-09 DIAGNOSIS — E236 Other disorders of pituitary gland: Secondary | ICD-10-CM

## 2011-05-09 DIAGNOSIS — J449 Chronic obstructive pulmonary disease, unspecified: Secondary | ICD-10-CM

## 2011-05-09 DIAGNOSIS — K208 Other esophagitis without bleeding: Secondary | ICD-10-CM

## 2011-05-09 DIAGNOSIS — Z22322 Carrier or suspected carrier of Methicillin resistant Staphylococcus aureus: Secondary | ICD-10-CM

## 2011-05-09 DIAGNOSIS — M48061 Spinal stenosis, lumbar region without neurogenic claudication: Secondary | ICD-10-CM

## 2011-05-09 DIAGNOSIS — Z981 Arthrodesis status: Secondary | ICD-10-CM

## 2011-05-09 DIAGNOSIS — Z79899 Other long term (current) drug therapy: Secondary | ICD-10-CM

## 2011-05-09 DIAGNOSIS — E785 Hyperlipidemia, unspecified: Secondary | ICD-10-CM

## 2011-05-09 DIAGNOSIS — J189 Pneumonia, unspecified organism: Secondary | ICD-10-CM

## 2011-05-09 DIAGNOSIS — IMO0002 Reserved for concepts with insufficient information to code with codable children: Secondary | ICD-10-CM

## 2011-05-09 DIAGNOSIS — Z5189 Encounter for other specified aftercare: Principal | ICD-10-CM

## 2011-05-09 DIAGNOSIS — K219 Gastro-esophageal reflux disease without esophagitis: Secondary | ICD-10-CM

## 2011-05-09 DIAGNOSIS — Z9861 Coronary angioplasty status: Secondary | ICD-10-CM

## 2011-05-09 LAB — CBC
HCT: 33.4 % — ABNORMAL LOW (ref 39.0–52.0)
MCV: 89.1 fL (ref 78.0–100.0)
RBC: 3.75 MIL/uL — ABNORMAL LOW (ref 4.22–5.81)
RDW: 13.9 % (ref 11.5–15.5)
WBC: 11.5 10*3/uL — ABNORMAL HIGH (ref 4.0–10.5)

## 2011-05-09 LAB — COMPREHENSIVE METABOLIC PANEL
ALT: 47 U/L (ref 0–53)
AST: 23 U/L (ref 0–37)
Alkaline Phosphatase: 99 U/L (ref 39–117)
CO2: 28 mEq/L (ref 19–32)
Calcium: 8.7 mg/dL (ref 8.4–10.5)
Potassium: 3.6 mEq/L (ref 3.5–5.1)
Sodium: 123 mEq/L — ABNORMAL LOW (ref 135–145)
Total Protein: 5.4 g/dL — ABNORMAL LOW (ref 6.0–8.3)

## 2011-05-09 NOTE — Progress Notes (Signed)
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

## 2011-05-10 DIAGNOSIS — IMO0002 Reserved for concepts with insufficient information to code with codable children: Secondary | ICD-10-CM

## 2011-05-10 DIAGNOSIS — D62 Acute posthemorrhagic anemia: Secondary | ICD-10-CM

## 2011-05-10 DIAGNOSIS — E236 Other disorders of pituitary gland: Secondary | ICD-10-CM

## 2011-05-10 DIAGNOSIS — K208 Other esophagitis: Secondary | ICD-10-CM

## 2011-05-10 LAB — COMPREHENSIVE METABOLIC PANEL
CO2: 28 mEq/L (ref 19–32)
Calcium: 8.8 mg/dL (ref 8.4–10.5)
Creatinine, Ser: 0.48 mg/dL (ref 0.4–1.5)
GFR calc Af Amer: >60 mL/min (ref >60)
GFR calc non Af Amer: >60 mL/min (ref >60)
Glucose, Bld: 101 mg/dL — ABNORMAL HIGH (ref 70–99)

## 2011-05-10 LAB — CBC
HCT: 35.3 % — ABNORMAL LOW (ref 39.0–52.0)
MCHC: 34.6 g/dL (ref 30.0–36.0)
MCV: 90.3 fL (ref 78.0–100.0)
Platelets: 457 10*3/uL — ABNORMAL HIGH (ref 150–400)
RDW: 14 % (ref 11.5–15.5)

## 2011-05-10 LAB — CULTURE, BLOOD (ROUTINE X 2)
Culture  Setup Time: 201205202131
Culture: NO GROWTH

## 2011-05-10 LAB — PRO B NATRIURETIC PEPTIDE: Pro B Natriuretic peptide (BNP): 791.1 pg/mL — ABNORMAL HIGH (ref 0–125)

## 2011-05-10 NOTE — Progress Notes (Signed)
Quick Note:  Started on acyclovir at hospital No other follow-up needed ______

## 2011-05-11 LAB — FUNGUS CULTURE, BLOOD: Culture: NO GROWTH

## 2011-05-12 DIAGNOSIS — D62 Acute posthemorrhagic anemia: Secondary | ICD-10-CM

## 2011-05-12 DIAGNOSIS — IMO0002 Reserved for concepts with insufficient information to code with codable children: Secondary | ICD-10-CM

## 2011-05-12 DIAGNOSIS — K208 Other esophagitis: Secondary | ICD-10-CM

## 2011-05-12 LAB — COMPREHENSIVE METABOLIC PANEL
ALT: 55 U/L — ABNORMAL HIGH (ref 0–53)
Alkaline Phosphatase: 112 U/L (ref 39–117)
CO2: 27 mEq/L (ref 19–32)
Glucose, Bld: 120 mg/dL — ABNORMAL HIGH (ref 70–99)
Potassium: 4 mEq/L (ref 3.5–5.1)
Sodium: 127 mEq/L — ABNORMAL LOW (ref 135–145)
Total Protein: 6 g/dL (ref 6.0–8.3)

## 2011-05-12 LAB — DIFFERENTIAL
Basophils Absolute: 0 10*3/uL (ref 0.0–0.1)
Lymphocytes Relative: 19 % (ref 12–46)
Neutro Abs: 5.4 10*3/uL (ref 1.7–7.7)

## 2011-05-12 LAB — CBC
HCT: 34.6 % — ABNORMAL LOW (ref 39.0–52.0)
Hemoglobin: 12 g/dL — ABNORMAL LOW (ref 13.0–17.0)
RBC: 3.84 MIL/uL — ABNORMAL LOW (ref 4.22–5.81)
WBC: 8 10*3/uL (ref 4.0–10.5)

## 2011-05-31 NOTE — Consult Note (Signed)
Ryan Jarvis, Ryan Jarvis               ACCOUNT NO.:  [id number]  MEDICAL RECORD NO.:  [id number]           PATIENT TYPE:  I  LOCATION:  3041                         FACILITY:  [facility]  PHYSICIAN:  Cecille Aver, M.D.DATE OF BIRTH:  Apr 27, 1951  DATE OF CONSULTATION: DATE OF DISCHARGE:                                CONSULTATION   REQUESTING PHYSICIAN:  Jeoffrey Massed, MD, with [facility].  PRIMARY GASTROENTEROLOGIST:  Dr. Mechele Collin in [facility] in [place].  PRIMARY CARDIOLOGIST:  Dr. Chales Abrahams in [place] [facility].  PRIMARY NEUROSURGEON:  [name], MD  REASON FOR CONSULTATION:  Hyponatremia.  HISTORY OF PRESENT ILLNESS:  Ryan Jarvis is a 60 year old gentleman with past medical history of coronary artery disease status post PTCA in 1990, COPD probably an exacerbation at this time, hyponatremia in the past.  As per the patient, he had hyponatremia in the past, but it was never this low.  There is a value of 125 in e-chart dating back in 2005. The patient says that he was never treated for his hyponatremia.  Ryan Jarvis had his elective laminectomy on Apr 24, 2011, by Dr. Danielle Dess.  He had been doing fine after the surgery for 1-2 days where he was feeling better and was ambulating with assistance and able to eat his food.  But since 2 days after surgery, he started getting progressively weaker with loss of appetite.  He has not been able to get out of bed since then. He started having abdominal epigastric pain and nausea and was found to have thrush.  He was also getting progressively short of breath with no evidence of pneumonia or congestion on his chest x-ray.  His admission sodium was 134, which trended down to 126 on Apr 28, 2011.  The patient was given fluid hydration at this time which was stopped thinking that this was probably SIADH.  The patient's sodium did go up marginally after stopping his IV fluids, but it stayed at  around 122 at which time Renal was consulted for further assistance with hyponatremia.  As per the patient's wife, the patient's mental status declined significantly since admission to the hospital from his baseline.  His does not have any vomiting, fever, or chills, but he does have a lot of sweating and loose stools since last 2 days.  He has about 2 loose stools a day now, although he was constipated after his surgery.  PAST MEDICAL HISTORY: 1. Coronary artery disease, status post PTCA in 1990. 2. Hyperlipidemia. 3. Hypertension. 4. Gastroesophageal reflux disease. 5. Peripheral vascular disease. 6. COPD. 7. 40-pack-year tobacco abuse. 8. History of lumbar spondylosis, status post lumbar laminectomy on     Apr 24, 2011, by Dr. Danielle Dess. 9. History of previous lumbar surgery in 2005.  MEDICATIONS CURRENTLY BEING GIVEN IN THE HOSPITAL: 1. Docusate. 2. Milk of magnesia. 3. Senna. 4. MiraLax. 5. Zocor. 6. Protonix. 7. Lopressor. 8. Aspirin. 9. Mucinex. 10.Advair. 11.Albuterol. 12.Atrovent. 13.Diflucan. 14.Nystatin.  The patient is currently not getting any IV fluids.  PHYSICAL EXAMINATION:  VITAL SIGNS:  Temperature 97.7, pulse 97-110, respirations 18-22, blood pressure 136/80, oxygen  saturation 100% on 3 liters. GENERAL:  The patient is lying in bed with head end of bed elevated, his fatigue and distress from shortness of breath. NECK:  Supple.  No JVD. CARDIOVASCULAR:  S1 and S2 normal.  No murmurs. RESPIRATION:  Bilateral expiratory wheezes with coarse breath sounds. ABDOMEN:  Distended, mildly tender to palpation in epigastric area. EXTREMITIES:  No edema.  Profuse sweating. NEUROLOGICAL:  Grossly nonfocal.  LABORATORY DATA:  Sodium 122, potassium 4.8, chloride 83, bicarb 32, BUN 31, creatinine 0.49, and glucose 126.  LFTs normal, albumin 2.7.  White count 8.3, hemoglobin 11.6, and platelet count 353.  Serum osmolality 252.  Urine osmolality 451.  Urine sodium  134.  TSH 0.734.  BNP 703.2. FOBT positive.  IMAGING STUDIES: 1. Chest x-ray 2-view shows postoperative bibasilar atelectasis, no     consolidation or pleural effusion. 2. CT of chest without contrast shows no pneumonia, but does show some     emphysema and postoperative ileus. 3. Two-D echo of the heart shows normal LV size with mild LV     hypertrophy and an EF of 55%.  This study had poor acoustic     windows.  ASSESSMENT AND PLAN: 1. Hyponatremia.  The cause of the hyponatremia seems to be     multifactorial.  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  Given his     history of COPD and current exacerbation, this might be the most     likely cause.  But his ileus and hypoalbuminemia might be     contributing to the SIADH.  His sodium did go up a little bit after     holding IV fluids, maybe he might response to fluid restriction. We will restrict his oral fluid at this time as well.  Given his extreme lethargic condition, hyponatremia might be symptomatic in this patient.So, we will treat him with demeclocycline at this time and check his BMET twice a day for next 2-3 days.  If sodium does not respond to this agent, we will change it to newer agent for SIADH.  No further evaluations as far as his hyponatremia is concerned are required at this time.  We will monitor his sodium and his clinical condition closely while he is in the hospital. 1. Chronic obstructive pulmonary disease exacerbation.  The patient is     on inhalers as dictated above.  His chest x-ray does not show any     pneumonia.  He is short of breath at this time, but saturating well     on supplemental oxygen. 2. Esophagitis.  The patient is getting nystatin and Diflucan per     Primary team. 3. Loose stools.  This seems to be secondary to resolution of his     postoperative ileus.   His C. diff/PCR is negative. 4. Coronary artery disease.  The patient does not have any chest pain     at this time.  His EKG does not show any ST-T wave changes.  He is     on aspirin and beta-blocker for secondary prevention. 5. Hypertension.  Again, the patient is on metoprolol, which seems to     be controlling his blood pressure well. 6. Hyperlipidemia.  The patient is on Zocor.  A lipid profile might be     helpful to rule out extreme hyperlipidemia  as a cause of     hyponatremia. 7. Postoperative day #8.  The patient has mild pain that is controlled     with Ultram and Percocet.  The Primary team is trying to limit his     narcotic pain medicine and trying to get him out of bed and     ambulate more for his postoperative ileus which seems to be     appropriate.  Thank you for this consultation.  We will follow this patient along with you.  Please call for any further questions.     Bethel Born, MD   ______________________________ Cecille Aver, M.D.    MD/MEDQ  D:  05/01/2011  T:  05/02/2011  Job:  [id number]  Electronically Signed by Bethel Born  on 05/17/2011 04:02:17 PM Electronically Signed by Annie Sable M.D. on 05/31/2011 11:59:50 AM

## 2011-06-09 NOTE — Discharge Summary (Signed)
Ryan Jarvis, Ryan Jarvis               ACCOUNT NO.:  [id number]  MEDICAL RECORD NO.:  [id number]           PATIENT TYPE:  I  LOCATION:  4004                         FACILITY:  [facility]  PHYSICIAN:  Ranelle Oyster, M.D.DATE OF BIRTH:  05/22/1951  DATE OF ADMISSION:  05/09/2011 DATE OF DISCHARGE:  05/14/2011                              DISCHARGE SUMMARY   DISCHARGE DIAGNOSES: 1. Lumbar stenosis with radiculopathy, status post laminectomy lumbar     L2-3, L3-4, posterior lumbar arthrodesis on Apr 24, 2011. Pain management. 1. Community acquired pneumonia - resolved, positive HSV with     acyclovir x7 days, positive Methicillin-resistant Staphylococcus     aureus nasal nares with contact precautions. 2. Hypertension. 3. Anemia. 4. Hyponatremia/SIADH.  A 60 year old male with history of lumbar L4-5 decompression in 2005, admitted on Apr 24, 2011, with increased low back pain radiating to the lower extremities.  X-rays and imaging showed severe stenosis of lumbar L2-3, L3-4 with radiculopathy.  Underwent lumbar L2-3 decompression laminectomy, posterior lumbar arthrodesis on Apr 24, 2011, per Dr. Danielle Dess.  Postoperative lethargy, mild shortness of breath.  CT of the chest negative for pulmonary emboli or pneumonia.  Respiratory cultures did show gram-positive cocci in pairs and placed on Levaquin on May 05, 2011.  Positive MRSA nasal nares and placed on contact precautions. Bouts of hyponatremia 122-125 felt to be chronic as well as multifactorial, was advised to discontinue intravenous fluids and placed on fluid restriction.  Anemia 7.9, transfused 2 units on May 06, 2011.. Developed severe ulcerated esophagitis with EGD per Dr. Leone Payor showing esophageal brushing performed with biopsy positive for HSV, placed on intravenous acyclovir.  On May 09, 2011, he is minimal assist for ambulation.  He was admitted for comprehensive rehab program.  PAST MEDICAL HISTORY:  See discharge  diagnoses.  He smokes approximately one half to one pack per day.  Occasional alcohol.  ALLERGIES:  SULFA AND BELLADONNA AND ALKALOIDS.  SOCIAL HISTORY:  He lives with his wife.  Wife and daughter can assist. One-level home, 2 steps to entry.  Functional history prior to admission was independent.  He does drive. Functional status upon admission to rehab services was supervision bed mobility, minimal assist transfers, minimal assist ambulate, 120 feet with a rolling walker with cues for posture, minimal assist upper body as well as lower body activities of daily living.  MEDICATIONS PRIOR TO ADMISSION:  Metoprolol, Zocor, tramadol, aspirin and Prilosec.  PHYSICAL EXAMINATION:  VITAL SIGNS:  Blood pressure 153/84, pulse 87, temperature 98, respirations 22. GENERAL:  This was an alert male in no acute distress, oriented x3. Deep tendon reflexes 2+.  Sensation intact to light touch except distally to lower extremities. BACK:  Incision clean, dry and intact. LUNGS:  Clear to auscultation. CARDIAC:  Regular rate and rhythm. ABDOMEN:  Soft, nontender.  Good bowel sounds.  REHABILITATION HOSPITAL COURSE:  The patient was admitted to Inpatient Rehab Services with therapies initiated on a 3-hour daily basis consisting of physical therapy, occupational therapy, and rehabilitation nursing.  The following issues were addressed during the patient's rehabilitation stay.  Pertaining to Mr. Houp lumbar stenosis, radiculopathy, he  had undergone laminectomy, lumbar L2-3, L3-4, posterior lumbar arthrodesis on Apr 24, 2011, per Dr. Danielle Dess.  Surgical site healing nicely.  Neurovascular sensation intact.  He was wearing a back brace when out of bed.  He was allowed to sit at the edge of bed, to place his brace as well as remove.  Pain management with the addition of OxyContin sustained release 20 mg every 12 hours, which had been titrated.  He was using oxycodone for breakthrough pain with  good results.  He completed Levaquin for community-acquired hospital pneumonia as well as gram-positive cocci in pairs as per respiratory cultures on May 05, 2011.  He remained afebrile.  He had been placed on intravenous acyclovir for positive HSV, this was changed to p.o. to simplify his care.  At the time of discharge, he completed a 7-day course.  Due to some esophagitis, his diet was slowly advanced to regular, which he tolerated well.  He continued on contact precautions for MRSA nasal nares.  Blood pressures well controlled on Lopressor with no orthostatic changes and monitored with increased activity. Postoperative anemia, stable.  Latest hemoglobin 12, hematocrit 34.6. Ongoing bouts of hyponatremia varied from 122 to 125, which is now 127 as of May 28 with fluid restriction.  His intravenous fluids had recently been discontinued.  This was felt to be multifactorial.  He would follow up with his primary MD in regards to follow-up chemistries. The patient received weekly collaborative interdisciplinary team conferences to discuss estimated length of stay, family teaching, any barriers to discharge.  He was supervision minimal assist to ambulate, eating some, encouragement at time to participate, minimal assist for lower body activities of daily living.  His wife had been through full family teaching.  His strength and endurance are greatly improved throughout his rehab course and ultimate plan will be discharged to home with ongoing therapies as dictated per rehab services.  DISCHARGE MEDICATIONS:  At the time of dictation included: 1. Aspirin 81 mg daily. 2. Lopressor 50 mg twice daily. 3. Advair 250 - 50 Diskus one puff twice daily. 4. Protonix 40 mg b.i.d. 5. OxyContin Sustained Release 20 mg every 12 hours, this would be     tapered accordingly over the next few weeks. 6. Acyclovir 400 mg p.o. three times daily x2 days and stop. 7. Oxycodone Immediate Release 5 mg one to  two tablets every 4 hours     as needed for pain, dispensed with 90 tablets. 8. Robaxin 500 mg every 6 hours as needed for muscle spasms, dispensed     with 90 tablets.  DIET:  Regular.  SPECIAL INSTRUCTIONS:  Back brace when out of bed.  He may sit at the edge of the bed to don and doff his brace.  He should follow up with Dr. Barnett Abu, Neurosurgery [phone], call for appointment in the next 2 weeks, Dr. Stan Head in relations to his recent positive HSV and completing acyclovir.  He could follow up with his primary MD, Dr. [name], [facility], in reference to his hyponatremia.     Mariam Dollar, P.A.   ______________________________ Ranelle Oyster, M.D.    DA/MEDQ  D:  05/13/2011  T:  05/14/2011  Job:  [id number]  cc:   [name], MD Scot Jun, MD Iva Boop, MD,FACG Cecille Aver, M.D. [name], M.D.  Electronically Signed by Mariam Dollar P.A. on 05/14/2011 08:08:29 AM Electronically Signed by Faith Rogue M.D. on 06/09/2011 11:36:37 AM

## 2011-06-09 NOTE — H&P (Signed)
Ryan Jarvis, PARKISON               ACCOUNT NO.:  [id number]  MEDICAL RECORD NO.:  [id number]           PATIENT TYPE:  I  LOCATION:  4004                         FACILITY:  [facility]  PHYSICIAN:  Ranelle Oyster, M.D.DATE OF BIRTH:  [birth year]  DATE OF ADMISSION:  05/09/2011 DATE OF DISCHARGE:                             HISTORY & PHYSICAL   PRIMARY CARE PHYSICIAN:  Ryan Nones, MD, in [facility].  SURGEON:  [name], MD  HISTORY OF PRESENT ILLNESS:  This is a 60 year old white male with history of lumbar decompression at L5 who was admitted on May 10 with increased low back pain radiating to the legs.  X-rays and imaging showed severe stenosis at L2-L4 with radiculopathy.  He underwent an L2- L3 decompression and laminectomy and L3-L4 posterior lumbar arthrodesis on May 10 by Dr. Danielle Dess.  Postoperatively, he had some lethargy and shortness of breath.  CT was negative for pulmonary embolus or pneumonia.  Respiratory cultures were positive for gram-positive cocci in pairs.  He was placed on Levaquin on May 21.  He had positive MRSA in the nares as well, placed on contact precautions.  He has had difficulties with hyponatremia ranging from 121 to 127.  This was felt be chronic and also SIADH.  Fluid restriction was recommended initially, but not started.  He was also recommended to stop IV fluids.  The patient does have postoperative anemia and was transfused 2 units of packed red blood cells on May 22 for hemoglobin of 7.9.  He also had severe ulcerative esophagitis which was positive for HSV.  He was placed on IV acyclovir today.  The patient had problems with endurance and mobility as well as pain.  Rehab was consulted who felt this patient will ultimately benefit for inpatient rehab stay.  REVIEW OF SYSTEMS:  Notable for weakness, low back pain, otodynia, decreased appetite.  Full 12 review is in the written H and P.  PAST MEDICAL HISTORY:  Positive for CAD, PTCA in  1990, hyperlipidemia, hypertension, gastroesophageal reflux disease, PVD, COPD, and L4-L5 decompression in 2005.  He does smoke and does not drink, but occasionally.  FAMILY HISTORY:  Positive for CAD.  SOCIAL HISTORY:  The patient lives with wife.  Wife and daughter can assist as needed.  He has 1-level house with 2 steps to enter.  ALLERGIES: 1. SULFA. 2. BENADRYL. 3. ALKALOIDS.  HOME MEDICATIONS: 1. Metoprolol. 2. Zocor. 3. Tramadol. 4. Aspirin. 5. Prilosec.  LABORATORY DATA:  Hemoglobin 11.8, white count 11.5, platelets 403. Sodium 123, potassium 3.6, BUN 8, creatinine 0.4.  Weights are stable at about 64 kg.  Chest x-ray today showed no edema.  BNP was 3300 today.  PHYSICAL EXAMINATION:  VITAL SIGNS:  Blood pressure 153/84, pulse 87, respiratory rate 22, temperature 98. GENERAL:  The patient generally appears frail,no acute distress.  He does complain of sore throat. EARS, NOSE, AND THROAT:  Notable for coating over the tongue and oropharynx.  Dentition is borderline. NECK:  Otherwise, supple.CHEST:  Clear except for some decreased sounds at the bases.  No rhonchi or wheezes were appreciated. HEART:  Regular rate and  rhythm without murmurs, rubs, or gallops. EXTREMITIES:  No clubbing, cyanosis, or edema. ABDOMEN:  Soft, nontender. SKIN:  Notable for back incision which was draining a slight serosanguineous discharge. NEUROLOGIC:  Cranial nerves II-XII are grossly intact.  Reflexes 1+. Sensation was normal pinprick and light touch in both legs.  Strength is 4/5 in upper extremities.  He has 3/5 at the hips bilaterally with flexion, 3+ to 4 knee flexion/extension, and 4/5 with ankle dorsiflexion and plantar flexion bilaterally. PSYCHIATRIC:  Judgment, orientation, and memory were fair.  Mood was flat.  The patient was in some mild distress due to his pain is back and throat.  POSTADMISSION PHYSICIAN EVALUATION: 1. Functional deficit secondary to lumbar  stenosis/radiculopathy,     status post laminectomy L2-L3 with L3-L4 posterior lumbar     arthrodesis, postoperative day #15 today. 2. The patient was admitted to receive collaborative interdisciplinary     care between the physiatrist rehab nursing staff and therapy team. 3. The patient's level of medical complexity and substantial therapy     needs in context of that medical necessity cannot be provided a     lesser intensity.  Medical complexity includes in this case HSV     esophagitis, postoperative anemia, SIADH, and questionable     pneumonia. 4. The patient has experienced substantial functional loss from his     baseline.  Upon functional assessment at the time of our screening,     the patient was supervision bed mobility, min assist transfers with     decreased initiation and poor tolerance.  He is min assist 120 feet     with rolling walker with cues for posture.  He is min assist upper     body and lower body ADLs with again difficulty due to pain and poor     stamina.  Judging by the patient's diagnosis, physical exam, and     functional history, he has a potential for functional progress     which will result in measurable gains while inpatient rehab.  Gains     will be of substantial and practical use upon discharge to home in     facilitating mobility and self-care.  Interim changes in medical     status since Rehab consult are detailed above. 5. The physiatrist will provide 24-hour management of medical needs as     well as oversight of therapy plan, assess treatment, provide     guidance as appropriate regarding interaction of the two.  Medical     problem list and plan are below. 6. A 24-hour rehab nursing team will assist in management of the     patient's skin care needs as well as bowel and bladder function,     integration of therapy, concept techniques, nutrition, etc. 7. PT will assess and treat for lower extremity strength, range of     motion, gait,  endurance, adaptive techniques, equipment, goals,     modified independent. 8. OT will assess and treat for upper extremity use, ADLs, adaptive     techniques, equipment, functional mobility, upper extremity     strength with goals modified independent to occasional supervision. 9. Speech and Language Pathology will assess and treat for swallowing     needs and odynophagia with goals modified independent. 10.Case Management and Social Worker will assess and treat for     psychosocial issues and discharge planning. 11.Team conference will be held weekly to assess progress towards     goals and to determine  barriers at discharge. 12.The patient demonstrates sufficient medical stability and exercise     capacity to tolerate at least 3 hours therapy per day at least 5     days per week. 13.Estimated length of stay is 7-10 days depending on tolerance of     medical issues.  MEDICAL PROBLEM LIST AND PLAN: 1. DVT prophylaxis with gait and thigh-high TED hose. 2. Pain management with oxycodone IR and Robaxin p.r.n.  Also provide     viscous lidocaine and Cepacol spray for odynophagia. 3. Mood:  Team will provide supportive encouragement as we move     forward. 4. Questionable pneumonia:  Continue Levaquin x2 more days.  Most recent chest x-ray shows no active signs of disease.  Did have some     mild atelectasis.  The patient does have COPD and we will continue     handheld nebulizer, Advair, and incentive spirometry. 5. HSV esophagitis:  IV acyclovir x7 days, analgesic, care as well. 6. Positive MRSA:  Contact precautions. 7. Blood pressure control with Lopressor.  We will monitor for     mobility.  This is also on board for cardiac prophylaxis. 8. Anemia:  The patient has been transfused 2 units of packed red     blood cells.  We will follow up for any trend and changes in his     blood pressure.  Observe for tolerance of activities. 9. Hyponatremia:  This is due to SIADH and  multifactorial.  There is     likely a chronic component as well.  Follow up electrolytes     serially.  We will place on 1200 mL fluid restriction.  Recheck     labs daily.  Check daily weights as well. 10.CV:  Check daily weights as well as repeat BNP.  Consider     cardiology followup on this patient if fluid status becomes in     doubt.  His weights seem to be stable to trending slightly downward     from admission overall.  His high BNP of 3300 is concerning,     however and bears watching.     Ranelle Oyster, M.D.     ZTS/MEDQ  D:  05/09/2011  T:  05/10/2011  Job:  [id number]  cc:   Ryan Nones, MD [name], M.D.  Electronically Signed by Faith Rogue M.D. on 06/09/2011 11:36:34 AM

## 2011-06-19 NOTE — Op Note (Signed)
Ryan Jarvis               ACCOUNT NO.:  [id number]  MEDICAL RECORD NO.:  [id number]           PATIENT TYPE:  O  LOCATION:  XRAY                         FACILITY:  [facility]  PHYSICIAN:  [name], M.D.  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  OPERATIONS:  Laminectomy of L2 and L3, decompression of L2-3 and L3-4 with bony decompression greater than that for simply posterior lumbar interbody arthrodesis using PEEK spacers local autograft and allograft L2-3 and L3-4, segmental fixation L2, L3 and L4 with posterolateral arthrodesis L2-L4.  SURGEON:  [name], MD  FIRST ASSISTANT:  Clydene Fake, MD  ANESTHESIA:  General endotracheal.  INDICATIONS:  Mr. Ryan Jarvis is a 60 year old individual who about 6 years ago underwent decompression of L4-L5 for severe spondylitic stenosis.  He had mild spondylitic changes elsewhere.  However, in the past year's time, he has developed severe spondylitic stenosis noted at L3-4 with critical stenosis there at L2-3.  He had moderately severe stenosis and when advised regarding surgery, it was advised that both these levels would need to be decompressed and so doing the further destabilize that would need to be arthrodesed.  Surgery is now being carried out.  PROCEDURE:  The patient was brought to the operating room supine on the stretcher.  After the smooth induction of general endotracheal anesthesia, placement of Foley catheter, the patient was turned prone. The back was prepped with alcohol and DuraPrep and draped in sterile fashion.  Midline incision was created  and carried down to the lumbodorsal fascia on either side of the midline.  Self-retaining retractors were placed in the wound.  Interlaminar space at L3-4 and L2- 3 were then uncovered and after an adequate dissection over the facet joints and into the transverse processes between L2 and L4 was accomplished, the areas were packed off laterally.  Laminectomy was then undertaken removing the entirety of the lamina of L3 including the spinous process.  Most 3/4 articulations then partial laminectomy of L2 was completed removing the entirety of the inferior facets of L2 to its articulation with L3 and performing wide laminotomies, so as to access the disk space and to visualize the L2 nerve root above.  Once this area was decompressed, diskectomy was then performed at L2-3 and L3-4.  These were done completely with removal of substantial quantities of severely degenerating desiccated disk material and the endplates were then repaired with series of curettes and rongeurs, so as to allow placement of a spacer at L3-4, 12-mm PEEK spacers could be placed and these were packed with allograft in the form of Vitoss mixed with PureGen.  The inner body space was packed with autologous bone graft from the lamina. This same procedure was carried out at L2-3 and autologous bone graft was intact into the interspace.  Being that there is substantial instability with a forming kyphosis at this level, the lateral gutters were packed with bone and pedicle screws were then placed under fluoroscopic guidance at L2, L3 and L4, 60-mm precontoured rods were used to connect the pedicle screws L2-L4 and once these were set and torqued appropriately, final radiographs of the AP and lateral projection were obtained.  These identified good position of the surgical construct.  No complicating features and further gross inspection identified good decompression of L2, L3 and L4 nerve roots were all skeletonized out to the  lateral gutters.  Hemostasis was achieved in the soft tissues and ultimately the lumbodorsal fascia was closed with #1 Vicryl interrupted fashion, 2-0 Vicryl using subcutaneous tissues, 3-0 Vicryl subcuticularly.  Blood loss estimated 1200 mL total and 650 mL of Cell Saver blood was returned to the patient in the operating room.     [name], M.D.     Merla Riches  D:  04/24/2011  T:  04/25/2011  Job:  [id number]  Electronically Signed by Barnett Abu M.D. on 06/19/2011 05:05:37 PM

## 2011-07-09 ENCOUNTER — Ambulatory Visit: Payer: Self-pay | Admitting: Unknown Physician Specialty

## 2011-07-21 NOTE — Discharge Summary (Signed)
Ryan Jarvis               ACCOUNT NO.:  [id number]  MEDICAL RECORD NO.:  [id number]  LOCATION:  3304                         FACILITY:  [facility]  PHYSICIAN:  Ryan Jarvis, M.D.  DATE OF BIRTH:  [birth year]  DATE OF ADMISSION:  04/24/2011 DATE OF DISCHARGE:  05/09/2011                              DISCHARGE SUMMARY   ADMITTING DIAGNOSES: 1. 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 2. 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 3. Status post arthrodesis at L4-5.  DISCHARGE DIAGNOSES: 1. 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 2. 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 3. Status post arthrodesis at L4-5.  SECONDARY DIAGNOSES: 1. Severe ulcerative esophagitis. 2. Syndrome of inappropriate antidiuretic hormone secretion. 3. Hyponatremia. 4. Postoperative ileus. 5. Chronic obstructive pulmonary disease. 6. Coronary artery disease. 7. Peripheral vascular disease. 8. Fever. 9. Thrush. 10.Hypertension. 11.Gastrointestinal reflux disease. 12.Sepsis. 13.Acute respiratory distress.  BRIEF HISTORY AND HOSPITAL COURSE:  Mr. Ryan Jarvis is a 60 year old gentleman who 6 years ago underwent decompression L4-5 for severe spondylitic stenosis, developed adjacent segment disease at L2-3 and L3- 4, and had increasing difficulty with low back pain and bilateral lower extremity pain.  He had moderate-to-severe stenosis with recurrent decompression and fusion.  He tolerated L2-3 and L3-4 posterior spinal fusion decompression on Apr 24, 2011.  Postoperatively, he was doing fair.  First day postoperatively, he complained of severe pain.  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  He was in the neurosurgical ICU. Foley catheter was discontinued.  He was transferred to 3000 and started working with Physical Therapy, Occupational Therapy.  The patient had a great deal of difficulty with pain control.  He was having very slow progress with mobility.  He has developed increasing lethargy and weakness on postop day  #4.  He had chest x-ray, EKG, cardiac enzymes, CBC, UA, BMET, sed rate, CRP.  We got [facility] consult for assistance with medical management.  He continued to mobilize, placed him on incentive spirometry and a flutter valve and a pulmonary toilet as he has some baseline COPD.  He developed some significant hyponatremia, we obtained a Renal consult.  They assisted with medical issues along with the [facility].  Hemoglobin was 10.5, hematocrit 30.4.  Sodium 125, potassium 5.2, and continued to make slow progress.  Chest CT showed no pneumonia.  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  He continued to decline, was placed back in neurosurgical ICU.  Critical Care Medicine was consulted for assistance with management of hyponatremia, acute respiratory distress.  He also then developed some difficulty swallowing, severe esophagitis, ended up having a GI consult, had endoscopy which revealed ulcerative esophagitis severe.  ID was consulted for assistance with management of sepsis, esophagitis, and medical management.  The patient continued to make slow progress, lethargy improved.  He was a little more alert on postop day #13, still feeling poorly, and a lot of pain, tried to limit his pain medication due to his COPD.  He had some bronchitis and fever during his hospitalization as well.  On postop day #15, he was continued to improve, continued on antibiotics per ID, GI  was managing esophagitis, continued to mobilize.  Got a rehab consult for comprehensive inpatient rehabilitation due to the fact that he had become so deconditioned due to the complications postoperatively.  He was ready for discharge to comprehensive inpatient rehab on May 09, 2011.  He was mobilizing better out of bed t.i.d.  Continue with supportive care.  Continue with care for his  esophagitis and hyponatremia per Renal and per Medicine and per GI.  DISCHARGE CONDITION:  Stable and improved.  DISCHARGE INSTRUCTIONS:  Continued on tramadol, simvastatin, Prilosec, metoprolol, guaifenesin, and Percocet for pain control along with his nystatin for esophagitis, vancomycin and cefepime per ID, these were discontinued and he was placed on Levaquin 750 mg IV daily, placed on dysphagia diet, full liquids, placed on fluconazole for his esophagitis by GI, this was discontinued and he was placed on acyclovir, discontinued from Fentanyl and placed just on Percocet for pain control. No other pain medicine except for Ultram, tried to limit his narcotics. Arrangements were made to use Magic mouth wash with lidocaine.  He is ready for transfer to comprehensive inpatient rehab on May 09, 2011, stable, improved.  He is to follow up with Dr. Leone Jarvis as an outpatient, primary care doctor as an outpatient, Dr. Danielle Jarvis in 3-4 weeks as an outpatient.  During his hospitalization, he did have slight bit of wound dehiscence, local wound care, this was from lying on his back and not mobilizing well.  There was no frank infection, purulence, redness, or malodor.     [name].   ______________________________ Ryan Jarvis, M.D.    SCI/MEDQ  D:  07/03/2011  T:  07/04/2011  Job:  [id number]  Electronically Signed by Orlin Hilding P.A. on 07/08/2011 08:46:13 AM Electronically Signed by Barnett Abu M.D. on 07/21/2011 07:30:40 AM

## 2011-07-24 ENCOUNTER — Other Ambulatory Visit: Payer: Self-pay | Admitting: Neurological Surgery

## 2011-07-24 DIAGNOSIS — M545 Low back pain: Secondary | ICD-10-CM

## 2011-08-19 ENCOUNTER — Ambulatory Visit
Admission: RE | Admit: 2011-08-19 | Discharge: 2011-08-19 | Disposition: A | Discharge status: Home / Self Care | Payer: PRIVATE HEALTH INSURANCE | Source: Ambulatory Visit | Attending: Neurological Surgery | Admitting: Neurological Surgery

## 2011-08-19 DIAGNOSIS — M545 Low back pain: Secondary | ICD-10-CM

## 2011-11-05 ENCOUNTER — Ambulatory Visit: Payer: Self-pay | Admitting: Internal Medicine

## 2012-02-28 ENCOUNTER — Inpatient Hospital Stay: Payer: Self-pay | Admitting: Internal Medicine

## 2012-02-28 LAB — COMPREHENSIVE METABOLIC PANEL
Alkaline Phosphatase: 60 U/L (ref 50–136)
BUN: 16 mg/dL (ref 7–18)
Calcium, Total: 9.2 mg/dL (ref 8.5–10.1)
Co2: 23 mmol/L (ref 21–32)
Creatinine: 0.75 mg/dL (ref 0.60–1.30)
EGFR (Non-African Amer.): >60
Glucose: 98 mg/dL (ref 65–99)
SGPT (ALT): 36 U/L
Sodium: 130 mmol/L — ABNORMAL LOW (ref 136–145)

## 2012-02-28 LAB — CK TOTAL AND CKMB (NOT AT ARMC)
CK, Total: 33 U/L — ABNORMAL LOW (ref 35–232)
CK-MB: <0.5 ng/mL — ABNORMAL LOW (ref 0.5–3.6)

## 2012-02-28 LAB — CBC
HCT: 51.1 % (ref 40.0–52.0)
HGB: 17.1 g/dL (ref 13.0–18.0)
MCH: 33.5 pg (ref 26.0–34.0)
MCHC: 33.6 g/dL (ref 32.0–36.0)
MCV: 100 fL (ref 80–100)
RBC: 5.12 10*6/uL (ref 4.40–5.90)

## 2012-02-29 LAB — CBC WITH DIFFERENTIAL/PLATELET
Basophil #: 0 10*3/uL (ref 0.0–0.1)
Eosinophil %: 0 %
HCT: 45.1 % (ref 40.0–52.0)
HGB: 15.2 g/dL (ref 13.0–18.0)
Lymphocyte #: 0.4 10*3/uL — ABNORMAL LOW (ref 1.0–3.6)
MCH: 34 pg (ref 26.0–34.0)
MCHC: 33.7 g/dL (ref 32.0–36.0)
Monocyte #: 0.2 10*3/uL (ref 0.0–0.7)
Neutrophil #: 15.5 10*3/uL — ABNORMAL HIGH (ref 1.4–6.5)

## 2012-02-29 LAB — BASIC METABOLIC PANEL
Anion Gap: 10 (ref 7–16)
BUN: 16 mg/dL (ref 7–18)
Creatinine: 0.74 mg/dL (ref 0.60–1.30)

## 2012-03-01 LAB — BASIC METABOLIC PANEL
Calcium, Total: 8.8 mg/dL (ref 8.5–10.1)
Chloride: 99 mmol/L (ref 98–107)
EGFR (Non-African Amer.): >60
Glucose: 113 mg/dL — ABNORMAL HIGH (ref 65–99)
Potassium: 4 mmol/L (ref 3.5–5.1)
Sodium: 133 mmol/L — ABNORMAL LOW (ref 136–145)

## 2012-03-02 LAB — CBC WITH DIFFERENTIAL/PLATELET
Basophil #: 0 10*3/uL (ref 0.0–0.1)
Eosinophil #: 0 10*3/uL (ref 0.0–0.7)
HCT: 37.9 % — ABNORMAL LOW (ref 40.0–52.0)
Lymphocyte %: 4.8 %
MCH: 33.8 pg (ref 26.0–34.0)
MCV: 101 fL — ABNORMAL HIGH (ref 80–100)
Monocyte %: 3.4 %
Platelet: 177 10*3/uL (ref 150–440)
RDW: 15.5 % — ABNORMAL HIGH (ref 11.5–14.5)
WBC: 9.6 10*3/uL (ref 3.8–10.6)

## 2012-03-02 LAB — BASIC METABOLIC PANEL
Calcium, Total: 8.9 mg/dL (ref 8.5–10.1)
Chloride: 100 mmol/L (ref 98–107)
Co2: 29 mmol/L (ref 21–32)
Creatinine: 0.65 mg/dL (ref 0.60–1.30)
Potassium: 3.9 mmol/L (ref 3.5–5.1)
Sodium: 140 mmol/L (ref 136–145)

## 2012-03-02 LAB — EXPECTORATED SPUTUM ASSESSMENT W GRAM STAIN, RFLX TO RESP C

## 2012-03-03 LAB — WBC: WBC: 7.9 10*3/uL (ref 3.8–10.6)

## 2012-03-05 LAB — BASIC METABOLIC PANEL
Calcium, Total: 8.8 mg/dL (ref 8.5–10.1)
Chloride: 98 mmol/L (ref 98–107)
Creatinine: 0.56 mg/dL — ABNORMAL LOW (ref 0.60–1.30)
Osmolality: 279 (ref 275–301)
Potassium: 3.9 mmol/L (ref 3.5–5.1)

## 2012-03-05 LAB — CBC WITH DIFFERENTIAL/PLATELET
Basophil %: 0.2 %
HCT: 40.8 % (ref 40.0–52.0)
HGB: 13.7 g/dL (ref 13.0–18.0)
Lymphocyte %: 18.9 %
Monocyte %: 5.1 %
Neutrophil #: 5.9 10*3/uL (ref 1.4–6.5)
Neutrophil %: 75.6 %
RBC: 4.05 10*6/uL — ABNORMAL LOW (ref 4.40–5.90)
WBC: 7.8 10*3/uL (ref 3.8–10.6)

## 2012-04-13 ENCOUNTER — Other Ambulatory Visit: Payer: Self-pay | Admitting: Internal Medicine

## 2012-04-14 ENCOUNTER — Ambulatory Visit: Payer: Self-pay | Admitting: Internal Medicine

## 2012-04-14 ENCOUNTER — Inpatient Hospital Stay: Payer: Self-pay | Admitting: Internal Medicine

## 2012-04-14 LAB — BASIC METABOLIC PANEL
Anion Gap: 6 — ABNORMAL LOW (ref 7–16)
BUN: 8 mg/dL (ref 7–18)
Calcium, Total: 8.9 mg/dL (ref 8.5–10.1)
Co2: 33 mmol/L — ABNORMAL HIGH (ref 21–32)
Osmolality: 265 (ref 275–301)
Potassium: 5.3 mmol/L — ABNORMAL HIGH (ref 3.5–5.1)

## 2012-04-14 LAB — CK TOTAL AND CKMB (NOT AT ARMC)
CK, Total: 34 U/L — ABNORMAL LOW (ref 35–232)
CK, Total: 25 U/L — ABNORMAL LOW (ref 35–232)
CK-MB: 1.1 ng/mL (ref 0.5–3.6)
CK-MB: 1.1 ng/mL (ref 0.5–3.6)

## 2012-04-14 LAB — CBC WITH DIFFERENTIAL/PLATELET
Basophil #: 0 10*3/uL (ref 0.0–0.1)
Basophil %: 0.6 %
HCT: 38.7 % — ABNORMAL LOW (ref 40.0–52.0)
Lymphocyte #: 1 10*3/uL (ref 1.0–3.6)
Lymphocyte %: 12.3 %
MCH: 33.7 pg (ref 26.0–34.0)
MCHC: 34.3 g/dL (ref 32.0–36.0)
MCV: 98 fL (ref 80–100)
Monocyte #: 1.2 x10 3/mm — ABNORMAL HIGH (ref 0.2–1.0)

## 2012-04-15 LAB — URINALYSIS, COMPLETE
Bacteria: NONE SEEN
Bilirubin,UR: NEGATIVE
Ketone: NEGATIVE
Ph: 8 (ref 4.5–8.0)
Protein: NEGATIVE
RBC,UR: NONE SEEN /HPF (ref 0–5)
WBC UR: NONE SEEN /HPF (ref 0–5)

## 2012-04-15 LAB — BASIC METABOLIC PANEL
Calcium, Total: 8.7 mg/dL (ref 8.5–10.1)
Creatinine: 0.63 mg/dL (ref 0.60–1.30)
EGFR (African American): >60
Glucose: 128 mg/dL — ABNORMAL HIGH (ref 65–99)
Potassium: 4.4 mmol/L (ref 3.5–5.1)

## 2012-04-15 LAB — CK TOTAL AND CKMB (NOT AT ARMC): CK-MB: 1.1 ng/mL (ref 0.5–3.6)

## 2012-04-15 LAB — CBC WITH DIFFERENTIAL/PLATELET
HCT: 35.2 % — ABNORMAL LOW (ref 40.0–52.0)
Lymphocyte #: 0.6 10*3/uL — ABNORMAL LOW (ref 1.0–3.6)
WBC: 4.9 10*3/uL (ref 3.8–10.6)

## 2012-04-17 LAB — BASIC METABOLIC PANEL
BUN: 13 mg/dL (ref 7–18)
Calcium, Total: 9.5 mg/dL (ref 8.5–10.1)
Glucose: 85 mg/dL (ref 65–99)

## 2012-04-17 LAB — CBC WITH DIFFERENTIAL/PLATELET
Basophil %: 0.2 %
Lymphocyte #: 1.7 10*3/uL (ref 1.0–3.6)
MCH: 33.5 pg (ref 26.0–34.0)
MCHC: 33.9 g/dL (ref 32.0–36.0)
MCV: 99 fL (ref 80–100)
Monocyte #: 1 x10 3/mm (ref 0.2–1.0)
Neutrophil #: 7.6 10*3/uL — ABNORMAL HIGH (ref 1.4–6.5)
Neutrophil %: 73.6 %
Platelet: 290 10*3/uL (ref 150–440)
RBC: 4.1 10*6/uL — ABNORMAL LOW (ref 4.40–5.90)

## 2012-04-19 LAB — CULTURE, BLOOD (SINGLE)

## 2012-04-19 LAB — EXPECTORATED SPUTUM ASSESSMENT W GRAM STAIN, RFLX TO RESP C

## 2012-04-20 LAB — BASIC METABOLIC PANEL
Anion Gap: 3 — ABNORMAL LOW (ref 7–16)
BUN: 17 mg/dL (ref 7–18)
Chloride: 99 mmol/L (ref 98–107)
Co2: 32 mmol/L (ref 21–32)
Creatinine: 0.78 mg/dL (ref 0.60–1.30)
EGFR (African American): >60
Osmolality: 271 (ref 275–301)
Potassium: 4.4 mmol/L (ref 3.5–5.1)
Sodium: 134 mmol/L — ABNORMAL LOW (ref 136–145)

## 2012-04-27 DIAGNOSIS — E785 Hyperlipidemia, unspecified: Secondary | ICD-10-CM | POA: Insufficient documentation

## 2012-04-27 DIAGNOSIS — I10 Essential (primary) hypertension: Secondary | ICD-10-CM | POA: Insufficient documentation

## 2012-04-27 DIAGNOSIS — I2510 Atherosclerotic heart disease of native coronary artery without angina pectoris: Secondary | ICD-10-CM | POA: Insufficient documentation

## 2012-04-27 DIAGNOSIS — M4800 Spinal stenosis, site unspecified: Secondary | ICD-10-CM | POA: Insufficient documentation

## 2012-04-27 DIAGNOSIS — K279 Peptic ulcer, site unspecified, unspecified as acute or chronic, without hemorrhage or perforation: Secondary | ICD-10-CM | POA: Insufficient documentation

## 2012-05-15 ENCOUNTER — Ambulatory Visit: Payer: Self-pay | Admitting: Internal Medicine

## 2012-06-07 ENCOUNTER — Ambulatory Visit: Payer: Self-pay | Admitting: Internal Medicine

## 2012-10-27 ENCOUNTER — Ambulatory Visit: Payer: Self-pay | Admitting: Unknown Physician Specialty

## 2012-12-06 ENCOUNTER — Ambulatory Visit: Payer: Self-pay | Admitting: Unknown Physician Specialty

## 2012-12-07 LAB — PATHOLOGY REPORT

## 2013-03-15 IMAGING — CR DG CHEST 1V PORT
1 series · 1 of 1 positions shown · non-contrast
Comparison: 05/04/2011

CLINICAL DATA: infiltrates.

PORTABLE CHEST - 1 VIEW

[view not recorded]
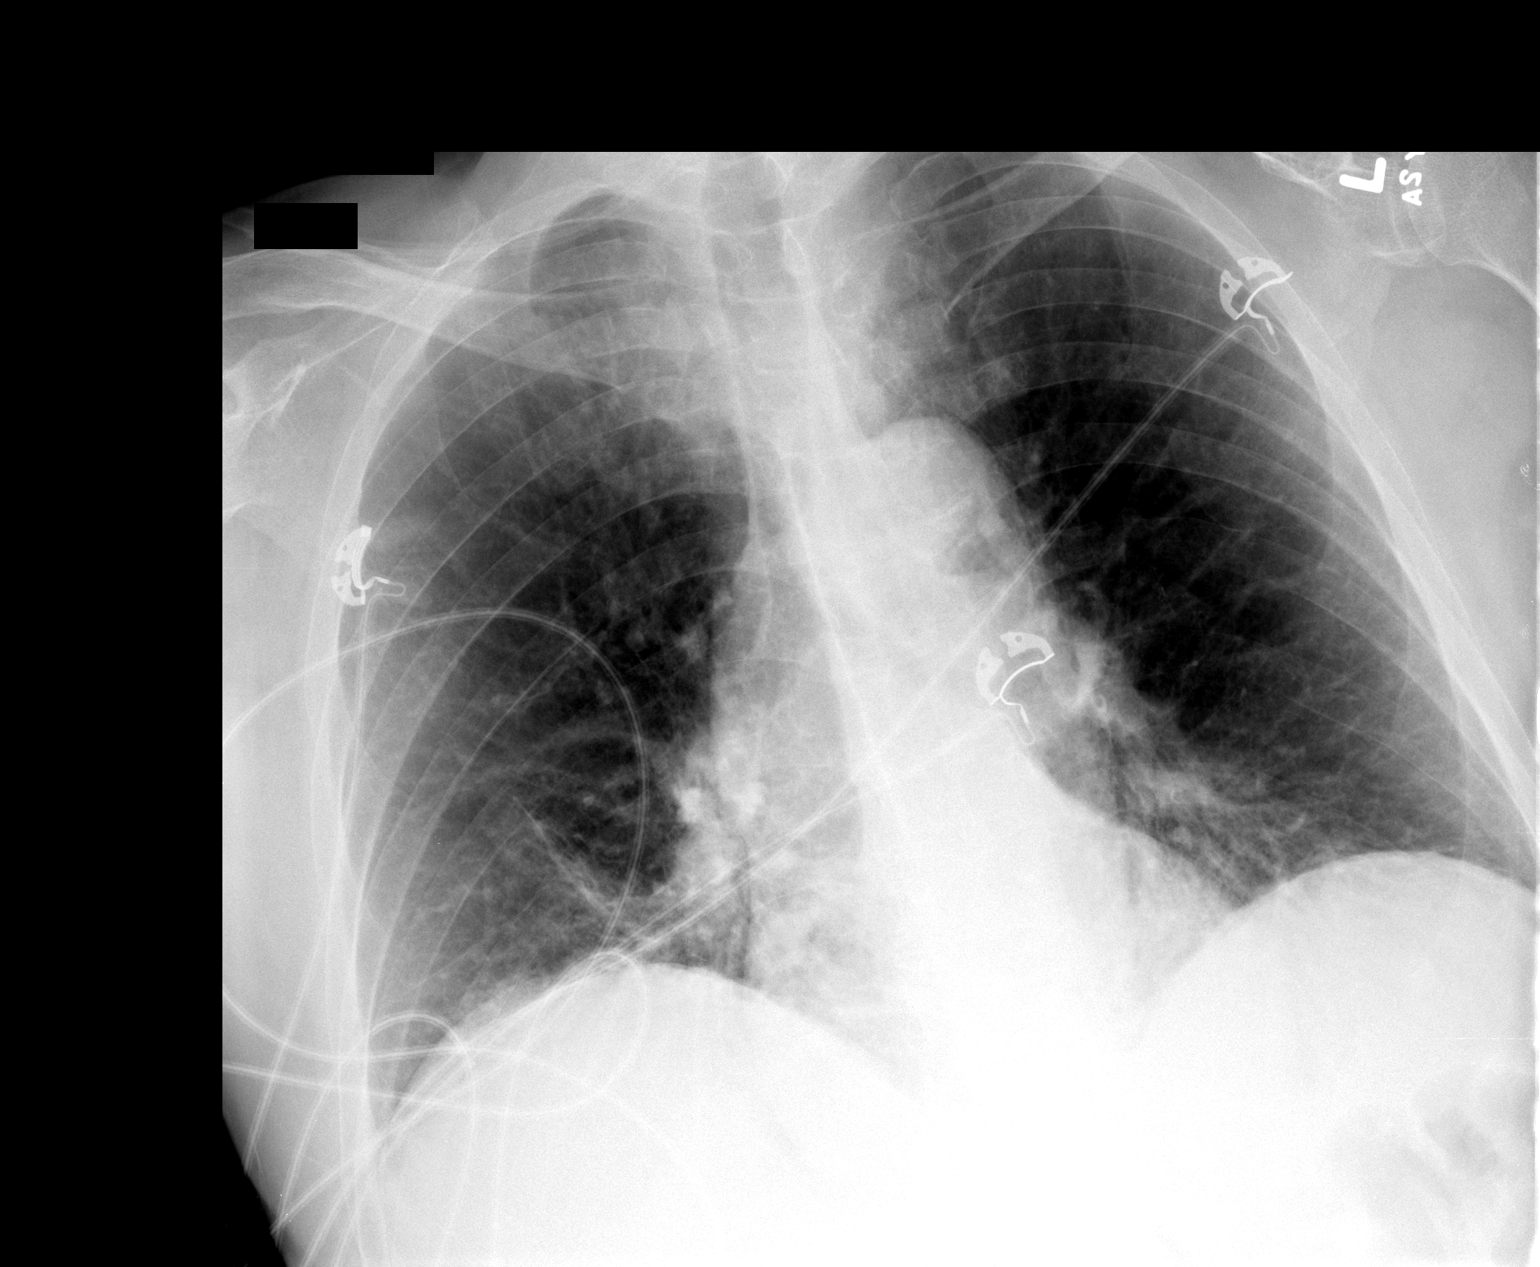

[1 of 1 positions shown; findings below may reference images not displayed]

FINDINGS: Persistent density at the lung bases, atelectasis versus
scarring.  Heart is normal size.  No effusions.  No real change.
IMPRESSION: Stable bibasilar opacities.

## 2013-03-15 IMAGING — CR DG CHEST 1V PORT
1 series · 1 of 1 positions shown · non-contrast
Comparison: 05/05/2011

CLINICAL DATA: PICC line placement

PORTABLE CHEST - 1 VIEW

[AP]
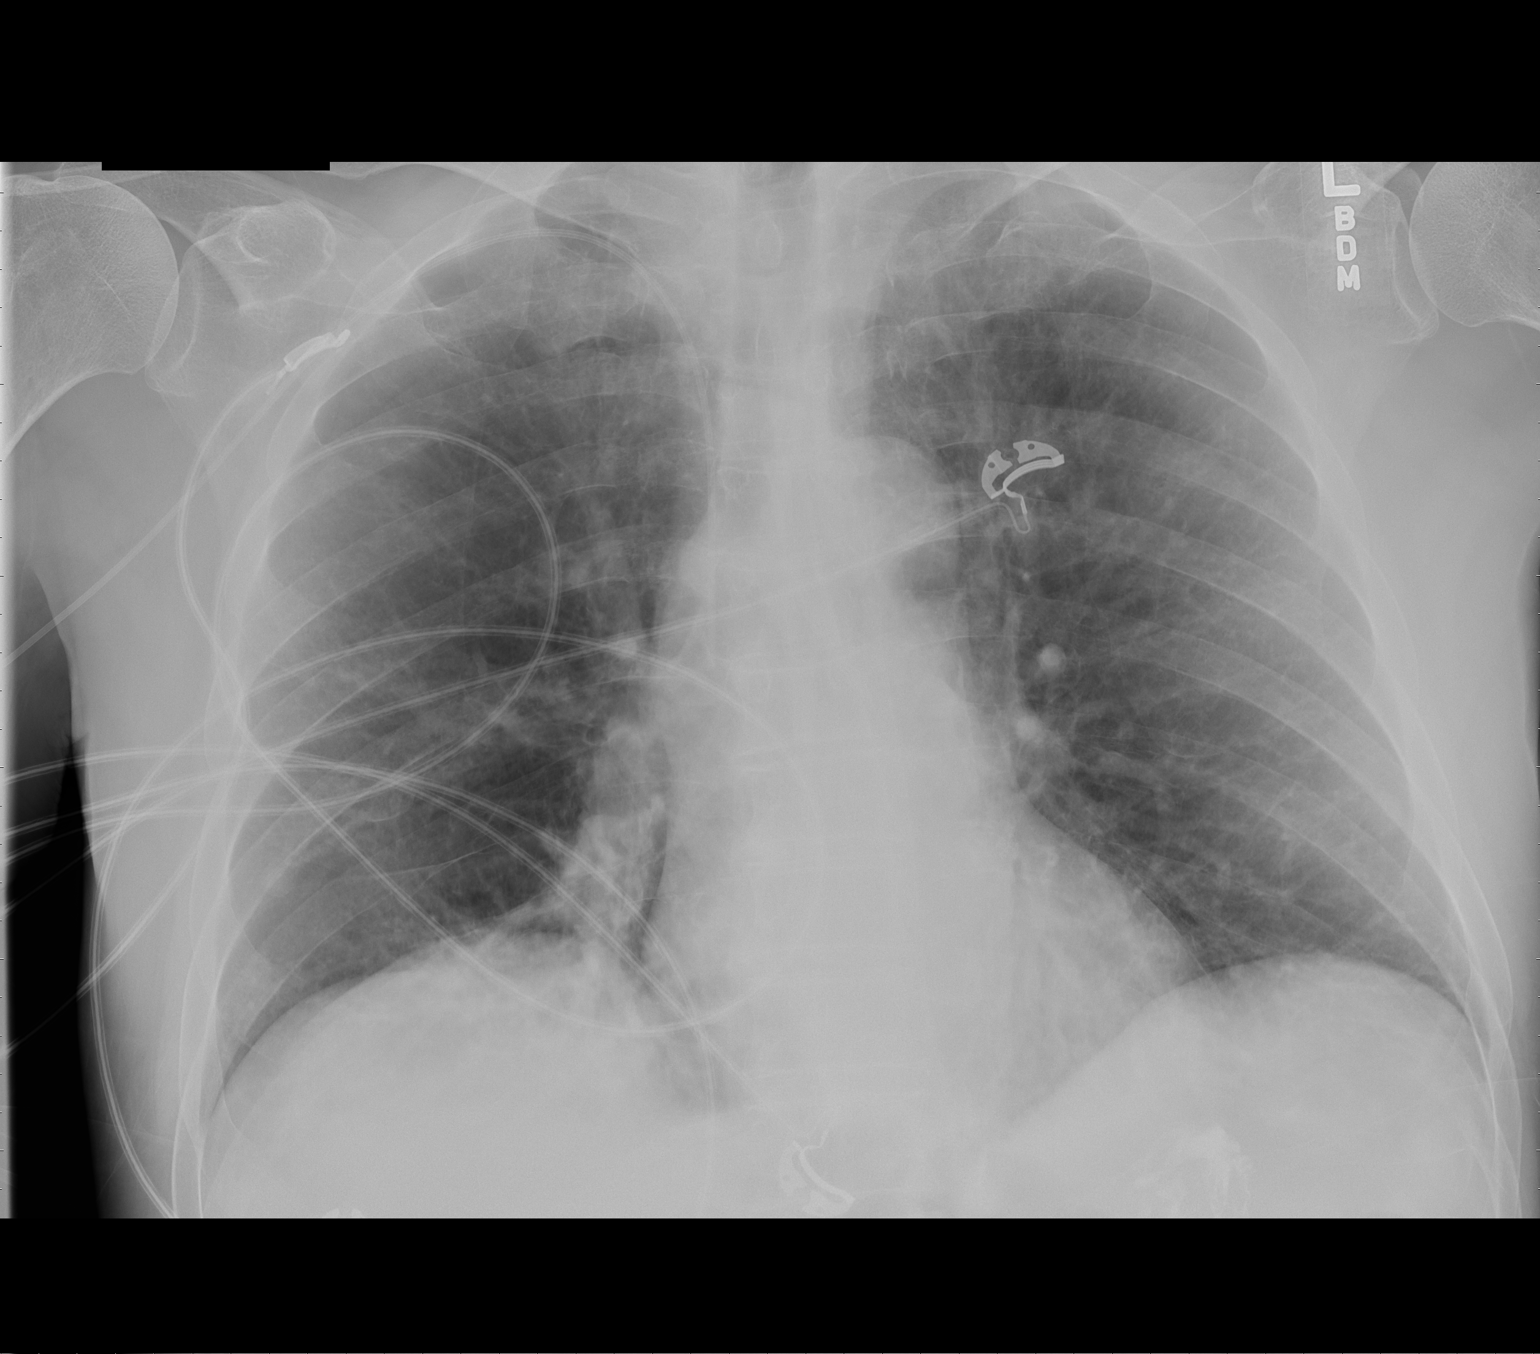

[1 of 1 positions shown; findings below may reference images not displayed]

FINDINGS: New right PICC line tip in the mid SVC.  Stable heart
size, and bibasilar opacities, atelectasis is favored.  No
developing effusion, collapse, consolidation or pneumothorax.
Midline trachea.
IMPRESSION: Right PICC line tip mid SVC.  Stable portable chest exam.

## 2013-03-18 ENCOUNTER — Ambulatory Visit: Payer: Self-pay | Admitting: Internal Medicine

## 2013-08-08 ENCOUNTER — Ambulatory Visit: Payer: Self-pay | Admitting: Internal Medicine

## 2014-04-13 ENCOUNTER — Ambulatory Visit: Payer: Self-pay | Admitting: Neurology

## 2014-07-04 DIAGNOSIS — G250 Essential tremor: Secondary | ICD-10-CM | POA: Insufficient documentation

## 2014-09-11 ENCOUNTER — Inpatient Hospital Stay: Payer: Self-pay | Admitting: Internal Medicine

## 2014-09-11 LAB — CBC WITH DIFFERENTIAL/PLATELET
Basophil #: 0 10*3/uL (ref 0.0–0.1)
Basophil %: 0.5 %
EOS PCT: 1.4 %
Eosinophil #: 0.1 10*3/uL (ref 0.0–0.7)
HCT: 50.5 % (ref 40.0–52.0)
HGB: 17.4 g/dL (ref 13.0–18.0)
Lymphocyte #: 1.7 10*3/uL (ref 1.0–3.6)
Lymphocyte %: 19.3 %
MCH: 31.8 pg (ref 26.0–34.0)
MCHC: 34.5 g/dL (ref 32.0–36.0)
MCV: 92 fL (ref 80–100)
MONO ABS: 1 x10 3/mm (ref 0.2–1.0)
Monocyte %: 10.9 %
NEUTROS ABS: 6 10*3/uL (ref 1.4–6.5)
Neutrophil %: 67.9 %
Platelet: 251 10*3/uL (ref 150–440)
RBC: 5.48 10*6/uL (ref 4.40–5.90)
RDW: 13.5 % (ref 11.5–14.5)
WBC: 8.8 10*3/uL (ref 3.8–10.6)

## 2014-09-11 LAB — AMYLASE: Amylase: 23 U/L — ABNORMAL LOW (ref 25–115)

## 2014-09-11 LAB — THEOPHYLLINE LEVEL: Theophylline: 21.2 ug/mL — ABNORMAL HIGH (ref 10.0–20.0)

## 2014-09-11 LAB — LIPASE, BLOOD: LIPASE: 133 U/L (ref 73–393)

## 2014-09-11 LAB — CK-MB: CK-MB: 1.2 ng/mL (ref 0.5–3.6)

## 2014-09-12 LAB — BASIC METABOLIC PANEL
Anion Gap: 9 (ref 7–16)
BUN: 20 mg/dL — ABNORMAL HIGH (ref 7–18)
CALCIUM: 8.5 mg/dL (ref 8.5–10.1)
Chloride: 90 mmol/L — ABNORMAL LOW (ref 98–107)
Co2: 34 mmol/L — ABNORMAL HIGH (ref 21–32)
Creatinine: 1.09 mg/dL (ref 0.60–1.30)
EGFR (African American): >60
EGFR (Non-African Amer.): >60
Glucose: 112 mg/dL — ABNORMAL HIGH (ref 65–99)
OSMOLALITY: 270 (ref 275–301)
POTASSIUM: 3.2 mmol/L — AB (ref 3.5–5.1)
SODIUM: 133 mmol/L — AB (ref 136–145)

## 2014-09-12 LAB — CBC WITH DIFFERENTIAL/PLATELET
BASOS ABS: 0 10*3/uL (ref 0.0–0.1)
BASOS PCT: 0.3 %
EOS ABS: 0 10*3/uL (ref 0.0–0.7)
Eosinophil %: 0.2 %
HCT: 44 % (ref 40.0–52.0)
HGB: 14.9 g/dL (ref 13.0–18.0)
LYMPHS ABS: 0.9 10*3/uL — AB (ref 1.0–3.6)
Lymphocyte %: 11.2 %
MCH: 31.7 pg (ref 26.0–34.0)
MCHC: 33.9 g/dL (ref 32.0–36.0)
MCV: 93 fL (ref 80–100)
Monocyte #: 0.3 x10 3/mm (ref 0.2–1.0)
Monocyte %: 3.6 %
Neutrophil #: 6.7 10*3/uL — ABNORMAL HIGH (ref 1.4–6.5)
Neutrophil %: 84.7 %
Platelet: 263 10*3/uL (ref 150–440)
RBC: 4.71 10*6/uL (ref 4.40–5.90)
RDW: 13.2 % (ref 11.5–14.5)
WBC: 7.9 10*3/uL (ref 3.8–10.6)

## 2014-09-12 LAB — URINALYSIS, COMPLETE
Bilirubin,UR: NEGATIVE
Blood: NEGATIVE
GLUCOSE, UR: NEGATIVE mg/dL (ref 0–75)
Hyaline Cast: 14
Leukocyte Esterase: NEGATIVE
Nitrite: NEGATIVE
Ph: 6 (ref 4.5–8.0)
Protein: NEGATIVE
SPECIFIC GRAVITY: 1.015 (ref 1.003–1.030)
SQUAMOUS EPITHELIAL: NONE SEEN
WBC UR: 2 /HPF (ref 0–5)

## 2014-09-12 LAB — CK-MB
CK-MB: 1 ng/mL (ref 0.5–3.6)
CK-MB: 1 ng/mL (ref 0.5–3.6)

## 2014-09-13 LAB — BASIC METABOLIC PANEL
Anion Gap: 4 — ABNORMAL LOW (ref 7–16)
BUN: 21 mg/dL — ABNORMAL HIGH (ref 7–18)
CALCIUM: 8.1 mg/dL — AB (ref 8.5–10.1)
Chloride: 97 mmol/L — ABNORMAL LOW (ref 98–107)
Co2: 34 mmol/L — ABNORMAL HIGH (ref 21–32)
Creatinine: 1.13 mg/dL (ref 0.60–1.30)
Glucose: 89 mg/dL (ref 65–99)
OSMOLALITY: 273 (ref 275–301)
Potassium: 3.1 mmol/L — ABNORMAL LOW (ref 3.5–5.1)
SODIUM: 135 mmol/L — AB (ref 136–145)

## 2014-09-13 LAB — PHOSPHORUS: PHOSPHORUS: 2.8 mg/dL (ref 2.5–4.9)

## 2014-09-13 LAB — MAGNESIUM: Magnesium: 1.4 mg/dL — ABNORMAL LOW

## 2014-09-14 LAB — BASIC METABOLIC PANEL
Anion Gap: 5 — ABNORMAL LOW (ref 7–16)
BUN: 17 mg/dL (ref 7–18)
CHLORIDE: 103 mmol/L (ref 98–107)
CO2: 31 mmol/L (ref 21–32)
Calcium, Total: 8.1 mg/dL — ABNORMAL LOW (ref 8.5–10.1)
Creatinine: 0.97 mg/dL (ref 0.60–1.30)
EGFR (Non-African Amer.): >60
GLUCOSE: 79 mg/dL (ref 65–99)
Osmolality: 278 (ref 275–301)
POTASSIUM: 3.6 mmol/L (ref 3.5–5.1)
Sodium: 139 mmol/L (ref 136–145)

## 2014-09-14 LAB — MAGNESIUM: Magnesium: 2 mg/dL

## 2014-09-16 LAB — CULTURE, BLOOD (SINGLE)

## 2014-09-17 LAB — CULTURE, BLOOD (SINGLE)

## 2014-09-18 ENCOUNTER — Ambulatory Visit: Payer: Self-pay | Admitting: Unknown Physician Specialty

## 2014-09-22 LAB — PATHOLOGY REPORT

## 2014-12-05 ENCOUNTER — Ambulatory Visit: Payer: Self-pay | Admitting: Neurological Surgery

## 2015-01-30 ENCOUNTER — Ambulatory Visit: Payer: Self-pay | Admitting: Specialist

## 2015-02-06 ENCOUNTER — Ambulatory Visit: Payer: Self-pay | Admitting: Neurology

## 2015-02-06 DIAGNOSIS — G629 Polyneuropathy, unspecified: Secondary | ICD-10-CM | POA: Insufficient documentation

## 2015-04-07 NOTE — Discharge Summary (Signed)
PATIENT NAME:  Ryan Jarvis, Ryan Jarvis MR#:  [id number] DATE OF BIRTH:  02-Jul-1951  DATE OF ADMISSION:  09/11/2014 DATE OF DISCHARGE:  09/15/2014  FINAL DIAGNOSES: 1.  Abdominal pain secondary to gastritis.  2.  Dehydration secondary to nausea and poor oral intake from the gastritis.  3.  Severe chronic obstructive pulmonary disease with chronic respiratory failure.  4.  Coronary artery disease.  5.  Hypertension.  6.  Degenerative disk disease with spinal stenosis and history of T12 compression fracture.  7.  Anemia.  8.  Essential tremor.  9.  Severe tinea pedis.  10.  Depression and anxiety.   HISTORY AND PHYSICAL: Please see dictated admission history and physical.   [place]: The patient was admitted with abdominal pain. He underwent CT of the abdomen, with angiogram, which did not reveal evidence of mass, obstruction, or significant flow-limiting disease. He was evaluated by gastroenterology with the recommendation for EGD, however, due to the patient's recent use of antiplatelet agents, this could not be performed at this time. He underwent HIDA scan with CCK which showed normal gallbladder filling, normal gallbladder ejection.   His nausea did improve, as did his abdominal pain, and his oral intake did pick up. His dehydration was treated with IV fluids and his hypomagnesemia and hypokalemia were treated and resolved as well.   The patient had significant anxiety and depression during his hospitalization, which at times kept him from cooperating well with medical staff. He was placed on medications to try to assist this, and was continued on duloxetine for this as well as his back pain. Medications were adjusted for his itching that was related to his feet, and this did seem to improve.   At this time, we will anticipate that he will be off anticoagulation until he has had a chance to have his EGD performed. It was discussed with the patient and his wife that having him off  aspirin and Plavix increases his risk of vascular events; however, this could not be avoided if we are to take a look at his stomach and they express their understanding. His theophylline level was high and so theophylline was stopped. He was taken off oral terbinafine as well out of concern that it may be contributing to the nausea.   He ambulated with physical therapy, his oxygen saturation dropped to approximately 84% with ambulation. Upon return to the room and recovering, after seeing him for 1 minute, his oxygen saturation was noted to be 94%, and so he will be placed on 2 liters oxygen by nasal cannula with ambulation.   A long discussion was held with the patient regarding plan for discharge. He feels ready to go home, although he acknowledges that he remains weak. He certainly would not be interested in rehabilitation at this point. He will be discharged to home in stable condition with his physical activity up with a walker as tolerated. Home health nursing and physical therapy will be arranged for the patient and he will be on oxygen as noted above. He wants to follow a regular diet, although it is recommended that he keep this bland for the next 4 to 5 days. We will have him follow up in our office in the next 2 weeks and we will make arrangements to try to have him follow up with Dr. Vira Agar as soon as possible next week so that endoscopy can be set up from there.   DISCHARGE MEDICATIONS: 1.  Advair 250/50 one puff b.i.d.  2.  Azelastine 2 sprays each nostril b.i.d.  3.  Toprol-XL 100 mg p.o. daily.  4.  DuoNeb SVNs 3 mL inhaled 4 times a day while awake.  5.  Spiriva 1 capsule inhaled daily.  6.  Duloxetine 60 mg p.o. daily.  7.  Fluocinonide 0.05% cream topically to the effected area 2 times a day as needed for itching.  8.  Primidone 250 mg p.o. daily.  9.  Tamsulosin 0.4 mg p.o. daily as needed for urinary retention.  10.  Lasix 80 mg p.o. daily as needed for edema.  11.  Potassium  20 mEq p.o. daily. 12.  Carafate 1 gram p.o. 4 times a day to be taken before meals and at bedtime.  13.  Pantoprazole 40 mg p.o. b.i.d.  14.  Prednisone taper starting at 30 mg daily decreasing x 10 mg every 2 days until done.  15.  Doxepin 10 mg p.o. daily as needed for itching.  16.  Ondansetron 4 mg p.o. t.i.d. as needed for nausea or vomiting.  17.  Colace 100 mg p.o. b.i.d.; he may stop this for loose stool.   At this point, he should stop aspirin, Plavix, at least until after endoscopy. He should avoid benzonatate, not use camphor cream, and would avoid metolazone. He may hold [employer] as he is using the [employer]. Would stop prochlorperazine as he is using ondansetron. Avoid further terbinafine or theophylline.   ____________________________ Adin Hector, MD bjk:sb D: 09/15/2014 13:24:31 ET T: 09/15/2014 13:41:47 ET JOB#: [id number]  cc: Adin Hector, MD, <Dictator> Manya Silvas, MD Ramonita Lab MD ELECTRONICALLY SIGNED 09/15/2014 16:08

## 2015-04-07 NOTE — Consult Note (Signed)
PATIENT NAME:  Ryan Jarvis, Ryan Jarvis MR#:  [id number] DATE OF BIRTH:  12-23-1950  DATE OF CONSULTATION:  09/12/2014  REFERRING PHYSICIAN:   CONSULTING PHYSICIAN:  Lollie Sails, MD  Patient of Dr. Ramonita Lab.   REASON FOR CONSULTATION: Nausea, anorexia, and abdominal pain.   HISTORY OF PRESENT ILLNESS: Ryan Jarvis is a 64 year old Caucasian male who has multiple medical issues. However he states that over the period of the past 2 weeks or so even thinking about food causes him to be nauseated. He states he has had a decrease of his appetite and about a 9 pound weight loss in this time period. He has also had some low epigastric pain for about 2 to 3 weeks as well. There is a remote history of peptic ulcer disease. He states he has a daily bowel movement. There is no black stool, blood in the stool, or slimy stools. He states that although he gets nauseated he does not throw up. He is able to eat food. He states that he occasionally takes an Alka-Seltzer perhaps once a week. He denies the use of other aspirin or NSAIDs. There is a smoking history, however he quit smoking in May of 2013. He does occasionally drink alcohol, but has never been a heavy drinker. His last upper and lower scope, that is EGD and colonoscopy were done on 12/06/2012. The EGD showed a Schatzki ring that was dilated as well as some gastritis. His colonoscopy, done for a history of colon polyps showed some small polyps as well as internal hemorrhoids. Gastrointestinal family history is negative for colorectal cancer or liver disease, however his mother had gallbladder problems and his father had peptic ulcer disease.   PAST MEDICAL HISTORY:  1.  Coronary artery disease 2.  Hypertension.  3.  Hyperlipidemia.   4.  Remote peptic ulcer disease as noted.  5.  Spinal stenosis.  6.  COPD.   7.  Anemia.  8.  Essential tremor.  9.  Severe rash in his feet thought to be secondary to tinea pedis. He has been seen by dermatology in this  regard and was placed on some terbinafine and Sarna cream.   OUTPATIENT MEDICATIONS:  Advair Diskus 250/50 inhaled twice a day, 81 mg aspirin once a day, Augmentin 875-125 every 12 hours for 7 days, azelastine inhaler 2 sprays nasally twice a day, Colace 100 mg a day, Combivent 2 puffs every 4 hours p.r.n., Duoneb inhaled 4 times a day, lorazepam 0.5 mg t.i.d., losartan 25 mg once a day, NicoDerm  7 mg once a day, prednisone once a day on an oral taper. Prilosec 20 mg once a day, Remeron 15 mg a day, Seroquel 25 mg a day, simvastatin 40 mg daily, Spiriva 18 mcg inhalation capsule once a day, and Toprol-XL 100 mg once a day.   ALLERGIES: HE IS ALLERGIC TO AMBIEN, BELLADONNA ALKALOIDS, PHENOBARBITAL, AND SULFA DRUGS. HE IS ALSO ALLERGIC TO ZYBAN.   PHYSICAL EXAMINATION:  VITAL SIGNS: Temperature 98.6, pulse 80, respirations 18, blood pressure 107/75, pulse oximetry 91.  GENERAL: He is a 64 year old Caucasian male in no acute distress, ruddy complexion.  HEENT: Normocephalic, atraumatic.  EYES: Anicteric.  NOSE: Septum midline. No lesions.  OROPHARYNX: No lesions.  NECK: Supple. No JVD.  HEART: Regular rate and rhythm.  LUNGS: Clear.  ABDOMEN: Soft. He is tender to palpation generally across the abdomen, however more so noted in the lower epigastric region extending toward the left. There are no masses, rebound, or organomegaly.  RECTAL: Anorectal exam deferred.  EXTREMITIES: No clubbing, cyanosis, or edema.  NEUROLOGICAL: Cranial nerves II-XII grossly intact. Muscle strength bilaterally equal and symmetric. DTRs bilaterally equal and symmetric.   LABORATORIES:  Yesterday he had an amylase of 23, lipase 133. Theophylline level of 21.2, this is just slightly out of range on the high side. He had a hemogram showing a white count of 8.8, hemoglobin and hematocrit 17.4/50.5, platelet count of 251,000. Blood cultures x 2 been negative. Serum cortisol slightly elevated at 25.7. A serum primidone elevated  at 20.1 on a scale of 5-12. He had repeat laboratories this morning showing a normal hemogram. Urinalysis showing 2 + bacteria, negative nitrite, negative protein, negative leukocyte esterase. Basic metabolic panel this morning showed a glucose of 112, BUN 20, creatinine 1.09, sodium 133, potassium 3.2, chloride 90, bicarbonate 34.   ASSESSMENT: Nausea, anorexia, weight loss, and epigastric pain. There are multiple etiologies for this. The patient did undergo CT scan with CTA with mesenteric runoffs earlier today. 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 Further differential diagnosis would include such as gastritis, gastric ulcers. Other concerns would be medications. He has had these symptoms intermittently for about 6 months, but only more recently became very much more intolerable for him. In review of his medications I did not find an agent specific that could be implicated in this particularly. It is of note that the patient has had recent treatment of a rash on his plantar surfaces of his feet. He has seen dermatology in this regard. Will recommend EGD tomorrow. Further recommendations to follow. Case discussed with Dr. Ramonita Lab. I have discussed the risks, benefits, and complications of EGD to include but not limited to bleeding, infection, perforation, and the risks of sedation, and he wishes to proceed.    ____________________________ Lollie Sails, MD mus:bu D: 09/12/2014 16:44:11 ET T: 09/12/2014 17:35:41 ET JOB#: [id number]  cc: Lollie Sails, MD, <Dictator> Lollie Sails MD ELECTRONICALLY SIGNED 09/20/2014 10:03

## 2015-04-07 NOTE — Consult Note (Signed)
Chief Complaint:  Subjective/Chief Complaint seen for abdominal pain and anorexia.  minimal nausea today.  luq abd pain 5/10.  no emesis, no bm overnight, states he does have some constipation at home but doesn't take anything to help regularly.   VITAL SIGNS/ANCILLARY NOTES: **Vital Signs.:   30-Sep-15 08:05  Vital Signs Type Q 8hr  Temperature Temperature (F) 98.7  Celsius 37  Pulse Pulse 77  Respirations Respirations 18  Systolic BP Systolic BP 100  Diastolic BP (mmHg) Diastolic BP (mmHg) 87  Mean BP 103  Pulse Ox % Pulse Ox % 89  Pulse Ox Activity Level  At rest  Oxygen Delivery 2L   Brief Assessment:  Cardiac Irregular   Respiratory clear BS   Gastrointestinal details normal Nondistended  Bowel sounds normal  No rebound tenderness  No gaurding  No rigidity  tender to palpation in the luq   Lab Results: Routine Chem:  30-Sep-15 05:26   Glucose, Serum -  BUN -  Creatinine (comp) -  Sodium, Serum -  Potassium, Serum -  Chloride, Serum -  CO2, Serum -  Calcium (Total), Serum -  Anion Gap -  Osmolality (calc) -  eGFR (African American) -  eGFR (Non-African American) - (eGFR values <44m/min/1.73 m2 may be an indication of chronic kidney disease (CKD). Calculated eGFR, using the MRDR Study equation, is useful in  patients with stable renal function. The eGFR calculation will not be reliable in acutely ill patients when serum creatinine is changing rapidly. It is not useful in patients on dialysis. The eGFR calculation may not be applicable to patients at the low and high extremes of body sizes, pregnant women, and vetetarians.)  Result Comment - specimen is hemolysed called by fea.  - reordered test.  Result(s) reported on 13 Sep 2014 at 06:46AM.    06:42   Phosphorus, Serum 2.8 (Result(s) reported on 13 Sep 2014 at 08:35AM.)  Magnesium, Serum  1.4 (1.8-2.4 THERAPEUTIC RANGE: 4-7 mg/dL TOXIC: > 10 mg/dL  -----------------------)  Glucose, Serum 89  BUN  21   Creatinine (comp) 1.13  Sodium, Serum  135  Potassium, Serum  3.1  Chloride, Serum  97  CO2, Serum  34  Calcium (Total), Serum  8.1  Anion Gap  4  Osmolality (calc) 273  eGFR (African American) >60  eGFR (Non-African American) >60 (eGFR values <625mmin/1.73 m2 may be an indication of chronic kidney disease (CKD). Calculated eGFR, using the MRDR Study equation, is useful in  patients with stable renal function. The eGFR calculation will not be reliable in acutely ill patients when serum creatinine is changing rapidly. It is not useful in patients on dialysis. The eGFR calculation may not be applicable to patients at the low and high extremes of body sizes, pregnant women, and vetetarians.)  Routine Hem:  29-Sep-15 04:05   Platelet Count (CBC) 263   Assessment/Plan:  Assessment/Plan:  Assessment 1) abdominal pain, anorexia, nausea-improving.  2) multiple medical issues with CAD, COPD, plantar rash, spinal stenosis, anemia   Plan 1) egd today, further recs to follow.  I have discussed the risks benefits and complications of egd to include not limited to bleeding infection perforation and sedation and he wishes to proceed.   Electronic Signatures: SkLoistine SimasMD)  (Signed 30-Sep-15 14:27)  Authored: Chief Complaint, VITAL SIGNS/ANCILLARY NOTES, Brief Assessment, Lab Results, Assessment/Plan   Last Updated: 30-Sep-15 14:27 by SkLoistine SimasMD)

## 2015-04-07 NOTE — Consult Note (Signed)
Chief Complaint:  Subjective/Chief Complaint feeling a little better, continues with left epigastric pain, mild nausea (tolerated regular diet today)   VITAL SIGNS/ANCILLARY NOTES: **Vital Signs.:   29-Sep-15 14:58  Vital Signs Type Routine  Temperature Temperature (F) 98.2  Celsius 36.7  Pulse Pulse 81  Respirations Respirations 16  Systolic BP Systolic BP 478  Diastolic BP (mmHg) Diastolic BP (mmHg) 80  Mean BP 94  Pulse Ox % Pulse Ox % 93  Pulse Ox Activity Level  At rest  Oxygen Delivery Room Air/ 21 %   Brief Assessment:  Cardiac Regular   Respiratory clear BS   Gastrointestinal details normal Soft  No rebound tenderness  mild generalized tenderness, mostly low to left epigastrum, bs positive, mild distension?   Lab Results: TDMs:  28-Sep-15 18:07   Theophylline, Serum  21.2 (Result(s) reported on 11 Sep 2014 at 07:11PM.)  Routine Micro:  28-Sep-15 18:07   Culture Comment NO GROWTH IN 8-12 HOURS  Result(s) reported on 12 Sep 2014 at 02:00AM.    18:18   Culture Comment NO GROWTH IN 8-12 HOURS  Result(s) reported on 12 Sep 2014 at 02:00AM.  General Ref:  28-Sep-15 18:07   Cortisol, Serum RIA ========== TEST NAME ==========  ========= RESULTS =========  = REFERENCE RANGE =  CORTISOL  Cortisol Cortisol                        [H  25.7 ug/dL           ]          2.3-19.4                                        Cortisol AM         6.2 - 19.4                                        Cortisol PM         2.3 - 11.9               [facility]            No: [id number]           [address], [place], [place] [address]           Lindon Romp, MD         [phone]   Result(s) reported on 12 Sep 2014 at 06:19AM.  Primidone, Serum ========== TEST NAME ==========  ========= RESULTS =========  = REFERENCE RANGE =  PRIMIDONE  Primidone (Mysoline(R)), Serum Primidone, Serum                [H  20.1 ug/mL           ]          5.0-12.0     Detection Limit =  0.3                                         <0.3 indicates None Detected Patient drug level exceeds published reference range.  Evaluate clinically for signs of potential toxicity. Phenobarbital, Serum            [  L  14 ug/mL             ]             15-40                                                Detection Limit = 2                                           <2 indicates None Detected               [facility]            No: [id number]           [address], [place], [place] [address]           Lindon Romp, MD         [phone]   Result(s) reported on 12 Sep 2014 at 08:19AM.  Routine Chem:  29-Sep-15 04:05   Glucose, Serum  112  BUN  20  Creatinine (comp) 1.09  Sodium, Serum  133  Potassium, Serum  3.2  Chloride, Serum  90  CO2, Serum  34  Calcium (Total), Serum 8.5  Anion Gap 9  Osmolality (calc) 270  eGFR (African American) >60  eGFR (Non-African American) >60 (eGFR values <27m/min/1.73 m2 may be an indication of chronic kidney disease (CKD). Calculated eGFR, using the MRDR Study equation, is useful in  patients with stable renal function. The eGFR calculation will not be reliable in acutely ill patients when serum creatinine is changing rapidly. It is not useful in patients on dialysis. The eGFR calculation may not be applicable to patients at the low and high extremes of body sizes, pregnant women, and vetetarians.)  Result Comment POTASSIUM/CREATININE - Slight hemolysis, interpret results with  - caution...tpl  Result(s) reported on 12 Sep 2014 at 04:43AM.  Cardiac:  29-Sep-15 04:05   CPK-MB, Serum 1.0 (Result(s) reported on 12 Sep 2014 at 04:30AM.)  Routine UA:  29-Sep-15 02:01   Color (UA) Yellow  Clarity (UA) Clear  Glucose (UA) Negative  Bilirubin (UA) Negative  Ketones (UA) Trace  Specific Gravity (UA) 1.015  Blood (UA) Negative  pH (UA) 6.0  Protein (UA) Negative  Nitrite (UA) Negative  Leukocyte Esterase (UA) Negative  (Result(s) reported on 12 Sep 2014 at 02:44AM.)  RBC (UA) 2 /HPF  WBC (UA) 2 /HPF  Bacteria (UA) 2+  Epithelial Cells (UA) NONE SEEN  Mucous (UA) PRESENT  Hyaline Cast (UA) 14 /LPF (Result(s) reported on 12 Sep 2014 at 02:44AM.)  Routine Hem:  29-Sep-15 04:05   WBC (CBC) 7.9  RBC (CBC) 4.71  Hemoglobin (CBC) 14.9  Hematocrit (CBC) 44.0  Platelet Count (CBC) 263  MCV 93  MCH 31.7  MCHC 33.9  RDW 13.2  Neutrophil % 84.7  Lymphocyte % 11.2  Monocyte % 3.6  Eosinophil % 0.2  Basophil % 0.3  Neutrophil #  6.7  Lymphocyte #  0.9  Monocyte # 0.3  Eosinophil # 0.0  Basophil # 0.0 (Result(s) reported on 12 Sep 2014 at 04:43AM.)   Assessment/Plan:  Assessment/Plan:  Assessment 1) nausea, anorexia/food fear, abdominalpain.  CT showing non-critical stenosis in sma.   Plan 1) continue current 2)  EGD tomorrow pm.  I have discussed the risks benefits and complications of egd to include not limited to bleeding infection perforation and sedation and he wishes to proceed.   Electronic Signatures: Loistine Simas (MD)  (Signed 29-Sep-15 16:28)  Authored: Chief Complaint, VITAL SIGNS/ANCILLARY NOTES, Brief Assessment, Lab Results, Assessment/Plan   Last Updated: 29-Sep-15 16:28 by Loistine Simas (MD)

## 2015-04-07 NOTE — Consult Note (Signed)
Brief Consult Note: Diagnosis: abdominal pain, nausea, anorexia.   Patient was seen by consultant.   Recommend further assessment or treatment.   Discussed with Attending MD.   Comments: Patietn seen and examined earlier this am.  Stable overnight.  continues with anorexia, mild low epigastric pain and nausea, symptoms intermittant for about 6 months, but increased over the past 2 weeks.  "even thinking about food makes me nauseated" .  Case discussed with Dr Caryl Comes this am and pm, and ct done showing no evidence of mesenteric insuficiency.  Will arrange  for egd tomorrow.  Patient with remote h/o gastritis on previous egd.  Full consult to follow.  Electronic Signatures for Addendum Section:  Loistine Simas (MD) (Signed Addendum 29-Sep-15 16:44)  pleae see dictation [phone].   Electronic Signatures: Loistine Simas (MD)  (Signed 29-Sep-15 14:19)  Authored: Brief Consult Note   Last Updated: 29-Sep-15 16:44 by Loistine Simas (MD)

## 2015-04-07 NOTE — Consult Note (Signed)
Chief Complaint:  Subjective/Chief Complaint I was preparing to do egd, and rechecked meds-patient on plavix, I did not notice previously and was not held. As such, aborted proceedure, discussed with Dr Caryl Comes.  Continue bid ppi and carafate, low residue diet, EGD 7 days off anticoagulant.   Electronic Signatures: Loistine Simas (MD)  (Signed 30-Sep-15 14:51)  Authored: Chief Complaint   Last Updated: 30-Sep-15 14:51 by Loistine Simas (MD)

## 2015-04-07 NOTE — H&P (Signed)
PATIENT NAME:  Ryan Jarvis, KEETCH MR#:  [id number] DATE OF BIRTH:  05-Jun-1951  DATE OF ADMISSION:  09/11/2014  CHIEF COMPLAINT: Abdominal pain with nausea and weight loss.   HISTORY OF PRESENT ILLNESS: A 64 year old male with severe COPD, coronary artery disease, hypertension, degenerative disk disease with spinal stenosis, presented today with complaints of nausea, poor appetite over the last 2 weeks. He feels that he gets nauseous with even smelling food. He has been able to keep a small amount of food down until the last several days when he really has not been able to keep much of anything down. He does not actually vomit but just feels like he gets extremely nauseated if he tries to eat anything. On examination, he has lost 9 pounds over the last 1 month, and was found to have epigastric abdominal pain. This does not seem to change with eating, although again he has not really tried. Has had some chills, but no fevers. Feels very fatigued. Shortness of breath worse, no chest pain. He is admitted now with refractory nausea, poor oral intake with abnormal weight loss, epigastric abdominal pain, and dehydration.   PAST MEDICAL HISTORY:  1. Coronary artery disease.  2. Hypertension.  3. Hyperlipidemia.  4. Peptic ulcer disease.  5. Spinal stenosis.  6. COPD.  7. Anemia.  8. Essential tremor.  9. Recent severe rash in his feet which is thought to be secondary to tinea pedis.   ALLERGIES: BELLADONNA, AMBIEN, SULFA, ZYBAN, SIMVASTATIN.  MEDICATIONS: 1. Advair 500/50 one puff b.i.d.  2. Aspirin 81 mg p.o. daily.  3. Azelastine 1 spray to each nostril b.i.d.  4. Plavix 75 mg p.o. daily.  5. Cymbalta 60 mg p.o. daily.  6. Lasix 80 mg p.o. daily as needed for edema.  7. DuoNeb SVNs 4 times a day.  8. Potassium 20 mEq p.o. daily as needed with Lasix.  8. Zaroxolyn 5 mg p.o. daily as needed for edema.  9. Toprol-XL 100 mg p.o. daily.  10. Pantoprazole 20 mg p.o. daily.  11. Primidone 250 mg p.o.  b.i.d.  12. Compazine 10 mg p.o. q. 6 hours as needed for nausea.  13. Tamsulosin 0.4 mg p.o. at bedtime.  14. Theodore 200 mg p.o. daily.  15. Spiriva 1 capsule inhaled daily.   SOCIAL HISTORY: Remote tobacco. No alcohol.   FAMILY HISTORY: Noncontributory.   REVIEW OF SYSTEMS: Please see HPI. No vision changes. Does not have any true dysphagia. No blood per rectum, melena. The remainder of complete review of systems is negative.   PHYSICAL EXAMINATION:  VITAL SIGNS: Weight 163, height 5 foot 6, temperature 98.7, pulse 67, saturation 97% on room air. Blood pressure 140/70.  GENERAL: Extremely fatigued-appearing male.  EYES: Pupils round, react to light. Lids and conjunctivae unremarkable.  EARS, NOSE, AND THROAT: Laceration on the lower lip, shallow. Oropharynx is dry without lesions.  NECK: Supple, trachea in the midline. No thyromegaly.  CARDIOVASCULAR: Regular rate and rhythm, distant without gallops, rubs.  LUNGS: Decreased air flow into the bases without wheeze or retractions.  ABDOMEN: Soft with hypoactive bowel sounds. Epigastric tenderness without rebound or mass.  SKIN: Decreased skin turgor.  LYMPH NODES: No cervical or supraclavicular nodes. SKELETAL: No clubbing. Dependent cyanosis is again seen.  LYMPH NODES: No cervical or supraclavicular nodes.   IMPRESSION AND PLAN:  1. Abdominal pain with refractory nausea, weight loss, dehydration. Initiate IV fluids. Place on b.i.d. proton pump inhibitors for now if he can tolerate. Gastrointestinal consultation. Three-way of the  abdomen. Once we see what his creatinine looks like, consideration for CT of the abdomen depending on progress with the above, consideration for CT angiogram, again depending on progress with the above though no real food fear.  2. Coronary artery disease. Hold aspirin, continue Plavix. Continue beta blocker and follow heart rate, blood pressure.  3. Dyspepsia. Increasing proton pump inhibitor as noted above.   4. Chronic obstructive pulmonary disease. Check cortisol, initiate steroids as he has had repeat exposure to steroids, although blood pressure, heart rate are high enough that this is not as suggestive of adrenal insufficiency.  5. Anxiety/depression. Continue Cymbalta which he is on for this as well as his back pain. Will reduce the dose slightly given his use of other medications currently.  6. Tremor. We will hold primidone for now.    ____________________________ Adin Hector, MD bjk:lt D: 09/11/2014 17:13:52 ET T: 09/11/2014 17:42:30 ET JOB#: [id number]  cc: Adin Hector, MD, <Dictator> Ramonita Lab MD ELECTRONICALLY SIGNED 09/12/2014 7:20

## 2015-04-08 NOTE — H&P (Signed)
PATIENT NAME:  Ryan Jarvis, Ryan Jarvis MR#:  [id number] DATE OF BIRTH:  [birth year]  DATE OF ADMISSION:  04/14/2012  CHIEF COMPLAINT: Shortness of breath, fatigue, poor appetite.   HISTORY OF PRESENT ILLNESS: The patient is a 64 year old male with history of coronary artery disease, hypertension, significant chronic obstructive pulmonary disease, history of esophagitis, who was seen in the office earlier this week with complaints of progressive shortness of breath, generalized malaise, fatigue, poor oral intake. Sodium had been found at 125 last week, 128 yesterday. He had been treated for thrush recently but was still having some dysphagia. He had been coughing up some yellowish material, had some chills at home. We had advised that he likely needed admission yesterday due to his generalized failure to thrive as well as his hyponatremia and shortness of breath, however, he declined admission. He was placed on antibiotics and given steroids, and returns today for re-evaluation. He really does not feel any better, was found to have a saturation of 88% on room air and pulse of 110, and is admitted now for chronic obstructive pulmonary disease with acute exacerbation, having failed outpatient treatment, as well as for hyponatremia. He had blood cultures obtained yesterday with those results pending. He was noted to have a sedimentation rate of 70.   PAST MEDICAL HISTORY:  1. Coronary artery disease with stenting in 1997.  2. Hypertension.  3. Hyperlipidemia.  4. Gastroesophageal reflux disease.  5. History of lumbar spinal stenosis with prior surgery.  6. Chronic obstructive pulmonary disease.  7. Recurrent sinusitis.  8. History of dysphagia with Schatzki's ring.  9. History of severe ulcerated esophagitis in the past with EGD positive for herpes simplex.  10. History of transient ischemic attack.  11. Anxiety. 12. Depression.   ALLERGIES: Belladonna, sulfa, Zyban, Ambien.    MEDICATIONS:  1. Advair  250/50, 1 puff b.i.d.  2. Omeprazole 20 mg p.o. b.i.d.  3. Simvastatin 40 mg p.o. at bedtime.  4. Combivent 2 puffs every 4 hours as needed.  5. Multivitamin 1 p.o. daily.  6. Astelin two sprays to each nostril b.i.d. 7. Aspirin 81 mg p.o. daily.  8. Toprol-XL 100 mg p.o. daily.  9. Losartan 50 mg p.o. daily.  10. NicoDerm 14 mg patch topically daily.  11. Lorazepam 0.5 mg p.o. t.i.d. p.r.n. anxiety.  12. Seroquel 25 mg p.o. at bedtime.  13. DuoNebs every 6 hours as needed.   SOCIAL HISTORY: Long history of tobacco abuse, stops during most recent hospitalization. No alcohol.   FAMILY HISTORY: Gallbladder cancer, congestive heart failure, hypertension, rheumatoid arthritis.   REVIEW OF SYSTEMS: Please see history of present illness. Some chills, but has not really documented a fever. He reports weight falling. No chest pain. Significant dyspnea with any exertion. No edema. No rash. The remainder of complete review of systems is negative.   PHYSICAL EXAMINATION:  VITAL SIGNS: Temperature 98.1, blood pressure 120/70, pulse 110, respirations 16, saturation 88% on room air.   GENERAL: A thin elderly male who appears ill.   HEENT: Pupils are round and reactive to light. Lids and conjunctivae are unremarkable. The oropharynx is dry without thrush lesions.   NECK: Supple. Trachea midline. No thyromegaly.   CARDIOVASCULAR: Tachycardic without gallops or rubs. Pulses 1+ in the carotids and radials.   LUNGS: Expiratory wheeze and inspiratory wheeze heard in the right side primarily. A few crackles in the bases without retractions.   ABDOMEN: Soft, nontender, nondistended. Positive bowel sounds. No guarding or rebound.   SKIN: Decreased  skin turgor. No rashes.   LYMPH NODES: No cervical or supraclavicular nodes.   MUSCULOSKELETAL: No clubbing or cyanosis. No significant edema. Generalized weakness. Decreased flexion and extension of the back secondary to previous surgery.   NEUROLOGICAL:  Cranial nerves are intact. Generalized weakness as noted above.   LABORATORY, DIAGNOSTIC AND RADIOLOGICAL DATA:  Labs from 04/13/2012 show sedimentation rate 70, sodium 128, creatinine 0.7.  Liver enzymes are unremarkable.  B12 is pending.  Troponin was less than 0.3.  Blood cultures negative to date. Chest x-ray showed chronic obstructive pulmonary disease changes without acute infiltrate.   IMPRESSION AND PLAN:  1. Chronic obstructive pulmonary disease with acute exacerbation: Continue on IV steroids, give nebulizers at standing dose and continue Advair. Hydrate to correct his hyponatremia and repeat his chest x-ray in the morning to see if there is any underlying infiltrate.  2. Coronary artery disease: Continue aspirin, Toprol, and his cholesterol medication. Follow on telemetry and follow cardiac enzymes. EKG today.  3. History of tobacco abuse: I congratulated him on staying away from cigarettes, and he will continue on NicoDerm patch.  4. Anxiety and depression: Unclear how much of a role this plays. Continue lorazepam, which he seems to have tolerated reasonably well, although he does complain of generalized fatigue.  I think this actually has gotten worse. We will continue Seroquel which helped him quite a bit when he was in the hospital previously.  5. History of dysphagia: I will place on pantoprazole. Consider GI consultation if we do not see     improvement in his swallowing once we hydrate him, particularly if he feels like he is having any dysphagia or odynophagia.   ____________________________ Adin Hector, MD bjk:cbb D: 04/14/2012 09:53:28 ET T: 04/14/2012 10:12:44 ET JOB#: [id number]  cc: Adin Hector, MD, <Dictator> Ramonita Lab MD ELECTRONICALLY SIGNED 04/16/2012 12:28

## 2015-04-08 NOTE — H&P (Signed)
PATIENT NAME:  Ryan Jarvis, GILLOOLY MR#:  [id number] DATE OF BIRTH:  Sep 03, 1951  DATE OF ADMISSION:  02/28/2012  PRIMARY CARE PHYSICIAN: Dr. Caryl Comes.  ED REFERRING PHYSICIAN: Dr. Reita Cliche.   CHIEF COMPLAINT: Shortness of breath, cough, fever.   HISTORY OF PRESENT ILLNESS: The patient is a 64 year old white male with history of chronic obstructive pulmonary disease, coronary artery disease, who, according to him, has chronic dyspnea on exertion and shortness of breath at baseline, but is not on any oxygen at home, who chronically uses inhalers for chronic obstructive pulmonary disease, continues to smoke who was having cough and shortness of breath about two weeks ago and was started on antibiotics with Augmentin as well as prednisone without any significant improvement. The patient's symptoms progressively got worse. He started having more shortness of breath and started having fevers at home. Therefore, came to the ED. In the ED, the patient is requiring 5 liters of oxygen. He is also tachycardic with a heart rate in the 130s. He is noted to have a chest x-ray which shows a right lower lobe pneumonia. The patient complains of having wheezing, cough productive of yellowish sputum. He denies any hemoptysis. He complains of having back pain which he chronically has. He otherwise states that his abdomen is feeling bloated. He denies any nausea, vomiting, diarrhea, or any urinary symptoms. Denies any left-sided chest pain.   PAST MEDICAL HISTORY:  1. History of coronary artery disease with myocardial infarction, stent placed in 1997.  2. Chronic obstructive pulmonary disease.  3. Gastroesophageal reflux disease.  4. Hypertension.  5. Hyperlipidemia.  6. Peripheral vascular disease.   PAST SURGICAL HISTORY: Back surgery x2.   ALLERGIES: Belladonna, phenobarbital and sulfa.   CURRENT MEDICATIONS:  1. Acetaminophen/Oxycodone 325/10, one tab p.o. every six hours p.r.n.  2. Advair 250/50 INH b.i.d.  3. Astelin  137 mcg two sprays nasally b.i.d.  4. Combivent 18 mcg 2 puffs 4 times a day.  5. Metoprolol succinate 50, one tab p.o. daily.  6. Mirtazapine 15 mg 1 tab p.o. at bedtime.  7. Prednisone 10 mg daily.  8. Prilosec 20 mg daily.  9. Simvastatin 40, one tab p.o. at bedtime.   SOCIAL HISTORY: Continues to smoke about 1/2 pack per day. Prior to this he was smoking 1 pack per day. Denies any alcohol or drug use.   FAMILY HISTORY: Positive for hypertension, coronary artery disease.   REVIEW OF SYSTEMS: CONSTITUTIONAL: Complains of fever, fatigue, weakness, chronic back pain. No weight loss. No weight gain. EYES: No blurred or double vision. No pain. No redness. No inflammation. No glaucoma. No cataracts. ENT: No tinnitus. No ear pain. No hearing loss. Does have year-round allergies. No epistaxis. No nasal discharge. No snoring. No postnasal drip. No sinus pain. No dentures. No difficulty with swallowing. RESPIRATORY: Complains of productive cough, wheezing. No hemoptysis. Complains of dyspnea. No asthma. No painful respirations. Has chronic obstructive pulmonary disease. CARDIOVASCULAR: No chest pain. No orthopnea. No edema. No arrhythmia. Does have dyspnea on exertion. No palpitations. No syncope. GASTROINTESTINAL: No nausea, vomiting, diarrhea. No abdominal pain. He complains of abdominal bloating. No hematemesis. No melena. No ulcers. GU: Denies any dysuria, hematuria, renal calculus, frequency or incontinence ENDOCRINE: Denies any polyuria, nocturia, or thyroid problems. No increase in sweating, heat or cold intolerance. HEME/LYMPH: Denies any anemia, easy bruisability, or bleeding or swollen glands. SKIN: No acne. No rash. No changes in mole, hair or skin. MUSCULOSKELETAL: Has chronic back pain. NEUROLOGIC: No numbness. No cerebrovascular accident. No  transient ischemic attack. PSYCHIATRIC: No anxiety. No depression. No bipolar disorder.   PHYSICAL EXAMINATION:  VITAL SIGNS: Temperature 101.3, pulse 134,  respirations 19, blood pressure 116/73, O2 92% on 5 liters.   GENERAL: The patient is a 64 year old white male who appears in mild respiratory distress.   HEENT: Head atraumatic, normocephalic. Pupils equal, round, reactive to light and accommodation. Extraocular movements intact. Pupils: No conjunctival pallor. No scleral icterus. Nose exam shows no drainage or ulceration. Oropharynx shows significant amount of thrush.   NECK: There is no thyromegaly. No carotid bruits. External ear exam shows no erythema or drainage.   CARDIOVASCULAR: Tachycardic. No murmurs, rubs, clicks, or gallops.   LUNGS: Right lung basal rhonchi.   ABDOMEN: Soft, nontender, nondistended. Positive bowel sounds x4.   EXTREMITIES:  Has a mottled appearance to lower extremities. Diminished DP and PT pulses.   SKIN: There is no rash.   LYMPHATICS: No lymph nodes palpable.   NEUROLOGICAL: Awake, alert, oriented x3. No focal deficits.   PSYCHIATRIC: Not anxious or depressed.   LABORATORY, RADIOLOGICAL AND DIAGNOSTIC DATA: Glucose 98, BUN 16, creatinine 0.75, sodium 130, potassium 4.7, chloride 94, CO2 23, calcium 9.2. LFTs showed a bilirubin total 1.4. CPK 33. Troponin less than 0.02. WBC count 16.6, hemoglobin 17.1, platelet count 198. ABG shows pH 7.47, pCO2 27, pO2 54. Chest x-ray shows right lower lobe infiltrate.   ASSESSMENT AND PLAN: The patient is a 64 year old with history of chronic obstructive pulmonary disease, coronary artery disease, has not been feeling well for the past few weeks, progressive shortness of breath and cough.  1. Acute respiratory failure due to chronic obstructive pulmonary disease exacerbation and right lower lobe pneumonia. At this time will go ahead and admit the patient. Give him IV antibiotics with ceftriaxone and azithromycin. Place him on Xopenex nebulizers in light of his tachycardia. We will place him on IV Solu-Medrol in light of his chronic obstructive pulmonary disease  exacerbation. Follow his respiratory status closely.  2. Sinus tachycardia, likely due to acute respiratory failure as well as possible systemic inflammatory response syndrome. At this time, he is already on Toprol XL. I will give him p.o. Cardizem to better control his heart rate.  3. Severe oral thrush. At this time, due to the severity of his thrush, we will give him p.o. fluconazole for five days, as well as do nystatin swish and swallow.  4. Coronary artery disease. Will place him on aspirin.  5. Hypertension. Continue metoprolol.  6. Gastroesophageal reflux disease. Continue Prilosec.  7. Hyperlipidemia. Continue simvastatin.  8. Miscellaneous. We will place him on Lovenox for deep vein thrombosis prophylaxis.   TIME SPENT: 40 minutes.   ____________________________ Lafonda Mosses Posey Pronto, MD shp:ap D: 02/28/2012 19:23:30 ET T: 02/29/2012 08:01:47 ET JOB#: [id number]  cc: Bristol Soy H. Posey Pronto, MD, <Dictator> Adin Hector, MD Alric Seton MD ELECTRONICALLY SIGNED 03/03/2012 21:58

## 2015-04-08 NOTE — Discharge Summary (Signed)
PATIENT NAME:  Ryan Jarvis, Ryan Jarvis MR#:  [id number] DATE OF BIRTH:  1951-07-05  DATE OF ADMISSION:  04/14/2012 DATE OF DISCHARGE:  04/20/2012   FINAL DIAGNOSES:  1. Acute chronic obstructive pulmonary disease exacerbation.  2. Acute on chronic respiratory failure secondary to #1.  3. Coronary artery disease.  4. Hypertension.  5. Gastroesophageal reflux disease.  6. Depression with anxiety.  7. Lumbar spinal stenosis with prior surgery.  8. Recurrent sinusitis.  9. History of transient ischemic attack.  10. Anxiety with depression.   HISTORY AND PHYSICAL: Please see dictated admission history and physical.   [place]: The patient was admitted with worsening respiratory failure, COPD exacerbation, as well as hyponatremia. No evidence of pneumonia was noted. He was placed on IV fluids with fairly quick resolution of his hyponatremia. He continued to have complaints of shortness of breath and exam revealed significant wheezing. Medications were adjusted and Pulmonary saw the patient. He was gradually changed over to oral medications, ambulated with physical therapy, and was able to walk fairly well although continued to require oxygen with sats falling below 88% with activity. He felt like he had reached the point where he was ready to go home and so he will now be discharged to home in stable condition with his physical activity up with a walker as tolerated and is given prescription for a rollator walker. Home health nursing was arranged for him to monitor his oxygenation. His diet will be regular for now but will gradually change this over to 2 gram sodium once we make sure that his sodium level will stay within normal ranges. Will anticipate him following up with [place] in the next 1 to 2 weeks.   DISCHARGE MEDICATIONS:  1. Advair 250/50 one puff b.i.d.  2. Astelin nasal spray two sprays each nostril b.i.d.  3. Prilosec 20 mg p.o. daily.  4. Simvastatin 40 mg p.o. at bedtime.   5. Enteric-coated aspirin 81 mg p.o. daily.  6. Toprol-XL 100 mg p.o. daily. 7. Lorazepam 0.5 mg p.o. t.i.d. p.r.n. anxiety.  8. Losartan 25 mg p.o. daily, dose reduced.  9. Nicotine patch 7 mg topically daily x4 weeks.  10. Spiriva 1 capsule inhaled daily.  11. Seroquel 25 mg p.o. b.i.d.  12. Mirtazapine 15 mg p.o. at bedtime.  13. Augmentin 875 mg p.o. b.i.d. with food x7 days.  14. Prednisone taper starting at 60 mg daily, decreasing by 10 mg every two days.  15. Albuterol SVN 4 times a day while awake.  16. Albuterol MDI 2 puffs q.4 hours p.r.n. wheeze.  17. Duke's Mouthwash 5 mL p.o. t.i.d. p.r.n. mouth pain.   CODE STATUS: The patient was seen by Palliative Care and after extensive discussion with the patient by Palliative Care and by myself the patient has elected to be DO NOT RESUSCITATE and an out-of-facility DO NOT RESUSCITATE order will be sent with him home.   ____________________________ Adin Hector, MD bjk:drc D: 04/20/2012 07:55:16 ET T: 04/20/2012 08:32:38 ET JOB#: [id number]  cc: Adin Hector, MD, <Dictator> Ramonita Lab MD ELECTRONICALLY SIGNED 04/28/2012 7:26

## 2015-04-08 NOTE — Discharge Summary (Signed)
PATIENT NAME:  Ryan Jarvis, Ryan Jarvis MR#:  [id number] DATE OF BIRTH:  07/31/1951  DATE OF ADMISSION:  02/28/2012 DATE OF DISCHARGE:  03/06/2012  FINAL DIAGNOSES:  1. Bilateral lower lobe pneumonia.  2. Acute chronic obstructive pulmonary disease exacerbation.  3. Acute respiratory failure secondary to #1 and #2.  4. Coronary artery disease.  5. Gastroesophageal reflux disease.  6. Hypertension.  7. Hyperlipidemia.  8. History of peripheral vascular disease.  9. Degenerative disk disease with prior back surgery x2.  10. Severe anxiety and depression.  11. Supraventricular tachycardia.   HISTORY AND PHYSICAL: Please see dictated admission history and physical.   [place]: The patient was admitted with evidence of COPD exacerbation and found to have pneumonia. He had been on outpatient antibiotics and steroids without improvement. He was placed on IV steroids and antibiotics and had slow improvement. Blood cultures were positive but this was felt to be contaminant. Sputum culture was positive for Enterobacter cloacae and it was felt this could be the cause of his pneumonia. Infectious Disease saw the patient and he adjusted oral Levaquin and will complete a two-week course of this. He was weaned over to oral medications slowly and ambulated with physical therapy, finally getting up to about 400 feet. He required oxygen throughout most of his hospitalization. He was able to come off of it briefly at rest but with ambulation he dropped to 87% and so at this point he will be continued on oxygen 2 liters nasal cannula.   He has had complaints of chronic pain and did have a fall here after becoming confused with Ambien, fortunately, no injury. This was discussed at length with the patient's family members. Physical therapy worked with him and he did show significant improvement, ambulating with hand-held assistance. He was taken off Remeron, placed on Seroquel secondary to significant  anxiety, agitation, and this did seem to help. Lorazepam was also added and the risks/benefits of this medication were discussed at length with the patient and family members. He seems to do very well with it as needed and so at this point it will be continued.   It was felt he would benefit from home health nursing and physical therapy to improve his balance as well as to monitor his oxygenation due to his high risk of hospitalization and his balance issues. At this time he will be discharged to home in stable condition with physical activity up as tolerated. He should follow a 2 gram sodium diet. He will be on 2 liters nasal cannula as noted above. He will follow-up in our office within the next one week.   DISCHARGE MEDICATIONS:  1. Advair 250/50 one puff b.i.d.  2. Astelin nasal spray two sprays each nostril b.i.d.  3. Prilosec 20 mg p.o. daily.  4. Metoprolol XL 100 mg p.o. daily.  5. Percocet 10/325 one p.o. q.6 hours p.r.n. severe pain.  6. Simvastatin 40 mg p.o. at bedtime.  7. Enteric-coated aspirin 81 mg p.o. daily.  8. Colace 100 mg p.o. daily, hold for loose stools.  9. Losartan 50 mg p.o. daily.  10. NicoDerm patch 14 mg topically daily x6 weeks, then decrease to 7 mg topically x6 weeks, then stop.  11. Levaquin 750 mg p.o. daily x4 days to complete course.  12. Lorazepam 0.5 mg p.o. t.i.d. p.r.n. anxiety.  13. Prednisone taper starting at 60 mg, decreasing by 10 mg every two days.  14. Seroquel 25 mg p.o. at bedtime for appetite, anxiety, sleep with  side effects of medication discussed.  15. DuoNeb SVNs 4 times a day while awake.  16. Combivent MDI. May use this 2 puffs q.4 hours p.r.n. but should not use it within two hours of the nebulizer.  17. He was given instructions to stop mirtazapine.   ____________________________ Adin Hector, MD bjk:drc D: 03/06/2012 11:17:42 ET T: 03/07/2012 16:20:56 ET JOB#: [id number]  cc: Adin Hector, MD, <Dictator> Ramonita Lab  MD ELECTRONICALLY SIGNED 03/18/2012 20:24

## 2015-04-08 NOTE — Consult Note (Signed)
Impression: 64yo WM w/ h/o COPD admitted with Enterobacter pneumonia and CNS in the blood.  His CT of the chest shows bilateral pneumonia.  Enterobacter would be a possible etiology for pneumonia, while CNS would not be.  I would focus treatment on the Enterobacter. His WBC is normal today.  He is afebrile.  Will change his antibiotics to levofloxacin, po. Would treat for 5 days. The presence of CNS in the blood cultures likely represents a contaminant.  Were it to be a real infection, then he would have to have two separate infections as CNS does not cause pneumonia.   Will d/c the vanco. He was encouraged to quit smoking. 7)  He was extremely tearful regarding his decreased functional status.  Would consider treatment for depression.  Will defer to primary team.  Electronic Signatures: Donnie Gedeon, Heinz Knuckles (MD) (Signed on 19-Mar-13 15:50)  Authored   Last Updated: 19-Mar-13 15:52 by Fontaine Hehl, Heinz Knuckles (MD)

## 2015-04-08 NOTE — Consult Note (Signed)
PATIENT NAME:  Ryan Jarvis, Ryan Jarvis MR#:  [id number] DATE OF BIRTH:  07-26-1951  DATE OF CONSULTATION:  03/02/2012  REFERRING PHYSICIAN:  Ramonita Lab, MD CONSULTING PHYSICIAN:  Heinz Knuckles. Elisandro Jarrett, MD  REASON FOR CONSULTATION: Pneumonia and coagulase-negative staph bacteremia.   HISTORY OF PRESENT ILLNESS: The patient is a 64 year old white man with a past history significant for chronic obstructive pulmonary disease who was admitted on 02/28/2012 with three to four day history of worsening cough, shortness of breath, and sputum production with fevers and chills. The patient states that it has been over one year since he had MRSA pneumonia. He has been doing more poorly from a pulmonary point of view. He has had multiple chronic obstructive pulmonary disease exacerbations requiring treatment. He states that he began having worsening cough and shortness of breath with some fevers and chills several days prior to admission. He was bringing up yellowish sputum. He denies any hemoptysis, but has had some red-tinged sputum in house. Per the History and Physical, he apparently had been treated two weeks ago with Augmentin and prednisone, but the patient did not volunteer this information. He eventually presented to the hospital and was admitted. A chest x-ray on admission showed right lower lobe infiltrate consistent with pneumonia. He was initially treated with azithromycin and ceftriaxone. He was also given nystatin for thrush and fluconazole was added on 02/29/2012. Blood cultures were obtained on admission which grew coagulase negative staph in two of four bottles. Vancomycin was added on 03/01/2012. The patient has remained afebrile during his hospitalization. His white count was elevated on admission and has come down to normal. Sputum cultures have grown Enterobacter species. The patient states that he is feeling somewhat less short of breath. He is receiving IV steroids as well as antibiotics.   ALLERGIES:  Ambien, belladonna, phenobarbital, sulfa drugs.   PAST MEDICAL HISTORY:  1. Chronic obstructive pulmonary disease.  2. MRSA pneumonia.  3. Coronary artery disease status post stent placement in 1997.  4. Prior myocardial infarction.  5. Gastroesophageal reflux.  6. Hypertension.  7. Hypercholesterolemia.  8. Peripheral vascular disease.  9. Back surgery x2.   SOCIAL HISTORY: The patient lives with his wife. [place]. He smokes 1/2 pack of cigarettes per day. He has no pets at home. He does not drink. No injecting drug use history.   FAMILY HISTORY: Positive for coronary artery disease and hypertension.  REVIEW OF SYSTEMS: GENERAL: Positive for fatigue, fever, chills, and malaise. HEENT: No headaches. No sinus congestion. No sore throat. NECK: No stiffness. No swollen glands. RESPIRATORY: Positive cough with shortness of breath, wheezing, sputum production, and hemoptysis since he has been in the hospital. CARDIAC: Positive tachycardia with palpitations and chest pressure. No peripheral edema. GI: No nausea, no vomiting, no abdominal pain, no and change in his bowels. No change in his urine. He had decreased appetite prior to admission, mainly due to his shortness of breath. GU: No complaints. MUSCULOSKELETAL: He has chronic back pain. No red swollen joints. SKIN: No rashes. NEUROLOGIC: No focal weakness. PSYCHIATRIC: No complaints, but he was very tearful during the interview. All other systems are negative.   PHYSICAL EXAMINATION:   VITALS: T-max 100.2, T-current 97.9, pulse 92, blood pressure 108/75, and 94% on 3 liters.   GENERAL: A 64 year old white man in no acute distress.   HEENT: Normocephalic, atraumatic. Pupils equal and reactive to light. Extraocular motion intact. Sclerae, conjunctivae, and lids are without evidence for emboli or petechiae. Oropharynx  shows no erythema or exudate. Gums are in fair condition.   NECK: Supple. Full range of motion. Midline  trachea. No lymphadenopathy. No thyromegaly.   LUNGS: Bilateral expiratory wheezes. No focal consolidation. He was able to speak in full sentences, but he appeared mildly tachypneic.   CARDIAC: Regular rate and rhythm without murmur, rub, or gallop.   ABDOMEN: Soft, nontender, and nondistended. No hepatosplenomegaly. No hernia is noted.   EXTREMITIES: No evidence for tenosynovitis.   SKIN: Multiple ecchymoses on his arms, but no other rashes. No stigmata of endocarditis, specifically no Janeway lesions or Osler nodes.   NEUROLOGIC: No focal weakness.   PSYCHIATRIC: The patient was tearful during much of the interview and appeared to be clinically depressed.   LABS/STUDIES: BUN 14, creatinine 0.65, bicarbonate 29, anion gap 11. LFTs were unimpressive, with the exception of a total bilirubin of 1.4. White count from today was 9.6, hemoglobin 12.7, platelet count 177, and ANC 8.8. White count on admission was 16.6 and has come down steadily.  Sputum culture is growing Enterobacter.   Blood cultures are growing staph hominis in two out of four bottles.   A chest x-ray from admission shows right lower lobe infiltrate consistent with pneumonia.   A CT scan of the chest with contrast showed bilateral lower lobe pneumonia. Coronary artery disease was also present as well as abdominal aortic aneurysm that was infrarenal.   IMPRESSION: This is a 64 year old white man with a history of chronic obstructive pulmonary disease admitted with Enterobacter pneumonia and coagulase-negative staph in the blood.   RECOMMENDATIONS:  1. A CT of the chest shows bilateral pneumonia. Enterobacter would be a possible etiology for pneumonia, while coagulase-negative staph would not be. I would focus treatment on the Enterobacter.  2. His white count is normal today. He is afebrile. We will change his antibiotics to Levaquin p.o.  3. I would treat for five more days.  4. The presence of coagulase-negative staph in  the blood likely represents a contaminant. Were it to be a real infection, then would have to have two separate infections as coagulase-negative staph does not cause pneumonia. Most of his symptoms appeared to be respiratory related and pneumonia would be a unifying diagnosis.  5. We will discontinue the vancomycin.  6. He was encouraged to quit smoking.  7. He was extremely tearful regarding his decreased functional status. We will consider treatment for depression. We will defer to the primary team.   This is a moderately complex infectious disease case. Thank you very much for involving me in Ryan Jarvis care.  ____________________________ Heinz Knuckles. Malakai Schoenherr, MD meb:slb D: 03/02/2012 16:01:34 ET T: 03/02/2012 16:32:50 ET JOB#: [id number]  cc: Heinz Knuckles. Jamoni Broadfoot, MD, <Dictator> Myrah Strawderman E Zalyn Amend MD ELECTRONICALLY SIGNED 03/03/2012 10:04

## 2016-01-09 DIAGNOSIS — F3341 Major depressive disorder, recurrent, in partial remission: Secondary | ICD-10-CM | POA: Insufficient documentation

## 2016-03-19 DIAGNOSIS — D692 Other nonthrombocytopenic purpura: Secondary | ICD-10-CM | POA: Insufficient documentation

## 2016-06-20 ENCOUNTER — Other Ambulatory Visit: Payer: Self-pay | Admitting: Internal Medicine

## 2016-06-20 DIAGNOSIS — R042 Hemoptysis: Secondary | ICD-10-CM

## 2016-06-20 DIAGNOSIS — R9389 Abnormal findings on diagnostic imaging of other specified body structures: Secondary | ICD-10-CM

## 2016-06-24 ENCOUNTER — Ambulatory Visit
Admission: RE | Admit: 2016-06-24 | Discharge: 2016-06-24 | Disposition: A | Discharge status: Home / Self Care | Payer: Medicare Other | Source: Ambulatory Visit | Attending: Specialist | Admitting: Specialist

## 2016-06-24 ENCOUNTER — Ambulatory Visit: Admission: RE | Admit: 2016-06-24 | Payer: PRIVATE HEALTH INSURANCE | Source: Ambulatory Visit

## 2016-06-24 ENCOUNTER — Other Ambulatory Visit: Payer: Self-pay | Admitting: Specialist

## 2016-06-24 DIAGNOSIS — R042 Hemoptysis: Secondary | ICD-10-CM

## 2016-06-24 DIAGNOSIS — J189 Pneumonia, unspecified organism: Secondary | ICD-10-CM | POA: Diagnosis not present

## 2016-06-24 DIAGNOSIS — I2510 Atherosclerotic heart disease of native coronary artery without angina pectoris: Secondary | ICD-10-CM | POA: Insufficient documentation

## 2016-06-24 DIAGNOSIS — I700 Atherosclerosis of aorta: Secondary | ICD-10-CM | POA: Diagnosis not present

## 2016-06-24 MED ORDER — IOPAMIDOL (ISOVUE-300) INJECTION 61%
75.0000 mL | Freq: Once | INTRAVENOUS | Status: AC | PRN
  Administered 2016-06-24: 75 mL via INTRAVENOUS

## 2016-06-25 ENCOUNTER — Other Ambulatory Visit: Payer: Self-pay | Admitting: Specialist

## 2016-06-25 DIAGNOSIS — R918 Other nonspecific abnormal finding of lung field: Secondary | ICD-10-CM

## 2016-06-27 ENCOUNTER — Telehealth: Payer: Self-pay | Admitting: Internal Medicine

## 2016-06-27 NOTE — Telephone Encounter (Signed)
Spoke with wife and pt. appt made for 07/08/16 @ 10:15am with [facility]. Nothing further needed.

## 2016-06-27 NOTE — Telephone Encounter (Signed)
Patient's wife called stating that Dr. Raul Del is having Dr. Mortimer Fries do the Lung Biopsy and she hasn't heard anything for the procedure. Please call the wife Barnetta Chapel.

## 2016-06-30 ENCOUNTER — Ambulatory Visit: Payer: PRIVATE HEALTH INSURANCE

## 2016-07-02 ENCOUNTER — Other Ambulatory Visit: Payer: Self-pay | Admitting: Specialist

## 2016-07-02 DIAGNOSIS — R918 Other nonspecific abnormal finding of lung field: Secondary | ICD-10-CM

## 2016-07-08 ENCOUNTER — Encounter: Payer: Self-pay | Admitting: Internal Medicine

## 2016-07-08 ENCOUNTER — Ambulatory Visit (INDEPENDENT_AMBULATORY_CARE_PROVIDER_SITE_OTHER): Payer: Medicare Other | Admitting: Internal Medicine

## 2016-07-08 DIAGNOSIS — R918 Other nonspecific abnormal finding of lung field: Secondary | ICD-10-CM

## 2016-07-08 DIAGNOSIS — J181 Lobar pneumonia, unspecified organism: Secondary | ICD-10-CM

## 2016-07-08 DIAGNOSIS — R938 Abnormal findings on diagnostic imaging of other specified body structures: Secondary | ICD-10-CM | POA: Diagnosis not present

## 2016-07-08 DIAGNOSIS — R042 Hemoptysis: Secondary | ICD-10-CM | POA: Diagnosis not present

## 2016-07-08 DIAGNOSIS — J189 Pneumonia, unspecified organism: Secondary | ICD-10-CM | POA: Insufficient documentation

## 2016-07-08 DIAGNOSIS — R9389 Abnormal findings on diagnostic imaging of other specified body structures: Secondary | ICD-10-CM

## 2016-07-08 NOTE — Assessment & Plan Note (Signed)
Secondary to right-sided pneumonia Review of x-ray and CAT scans performed There are 2 pulmonary nodules that are suspicious for possible malignancy. However, patient with good clinical improvement and no further episodes of hemoptysis at this time. I have discussed the case with the patient and his primary pulmonologist, Dr. Vella Kohler; at this time we have all agreed for close monitoring with surveillance CT scan in 2 weeks.

## 2016-07-08 NOTE — Assessment & Plan Note (Signed)
Right-sided pneumonia with multiple groundglass opacities and 2 pulmonary nodules. Completed course of antibiotics and steroids. Prednisone taper over 12 days, Levaquin 10 days-completed  Plan: -Repeat CT chest in 2 weeks

## 2016-07-08 NOTE — Assessment & Plan Note (Signed)
Multiple groundglass opacities within the right side, this seems to be a linear pattern or a well-defined area of the groundglass opacity. There are also 2 pulmonary nodules noted in the right upper lobe, one is almost abutting the pleura. Vasculitis workup has been performed by Dr. Raul Del, currently vasculitis labs are negative, and his aspirin has since stopped. Antimyeloperoxidase negative, anti-proteinase 3 negative, c-ANCA negative, p-ANCA negative, anti-GBM negative, sedimentation rate 17.  Plan: -Repeat CT chest in 2 weeks, bronchoscopy pending results of repeat CAT scan

## 2016-07-08 NOTE — Assessment & Plan Note (Addendum)
2 pulmonary nodules noted in the right upper lobe, one is for a close to the pleura the other one is consolidated both are less than 2 cm overall. These were associated with hemoptysis and cough, he is high risk for malignancy given the location and appearance of the nodules. However, patient with good clinical improvement, no further episodes of hemoptysis, cough. Given the clinical improvement, bronchoscopy will be withheld. I have discussed the case with his primary pulmonologist, Dr. Vella Kohler, and with the patient.  Plan: -Surveillance CT scan in 2 weeks -If repeat CAT scan is worse, then we will proceed with bronchoscopy with possible biopsy at that time.

## 2016-07-08 NOTE — Progress Notes (Signed)
[facility] Consultation    Date: 07/08/2016  MRN# [id number] Ryan Jarvis September 11, 1951  Referring Physician: Dr. Raul Del PMD - Dr. Treasa School is a 65 y.o. old male seen in consultation for abnormal CAT scan, evaluation for bronchoscopy  CC:  Chief Complaint  Patient presents with  . pulmonary consult    per Dr. Raul Del. pt states he was coughing up blood for 2 weeks. recently finished abx & prednisone 1wk ago & hasn't coughed up blood since.    HPI:  Patient presents today for a evaluation of bronchoscopy for right upper lobe pulmonary nodules. Patient has been having cough associated with hemoptysis since the beginning of July, he completed a course of prednisone (taper over 10 days), Levaquin 10 days, completed last week. Since his prednisone and Levaquin prescription along with treatment he has had no further episodes of hemoptysis or coughing. In beginning of July he had about 3-4 days of hemoptysis with bright red blood, he saw his primary care physician, a chest x-ray was obtained that showed right upper lobe pneumonia with possible lung nodules, this was followed by a CT chest scan that showed 2 right upper lobe pulmonary nodules along with multiple groundglass opacities. He is a Psychologist, sport and exercise, and former smoker. Today he denies any further worsening shortness of breath, cough, hemoptysis, sputum production.    PMHX:   Past Medical History:  Diagnosis Date  . Hypertension    Surgical Hx:  Past Surgical History:  Procedure Laterality Date  . BACK SURGERY     Family Hx:  Family History  Problem Relation Age of Onset  . Congestive Heart Failure Mother   . Congestive Heart Failure Father    Social Hx:   Social History  Substance Use Topics  . Smoking status: Former Smoker    Packs/day: 1.00    Years: 49.00    Types: Cigarettes  . Smokeless tobacco: Never Used     Comment: quit smoking in 2013  . Alcohol use No   Medication:   Current  Outpatient Rx  . Order #: [id number] Class: Historical Med  . Order #: [id number] Class: Historical Med  . Order #: [id number] Class: Historical Med  . Order #: [id number] Class: Historical Med  . Order #: [id number] Class: Historical Med  . Order #: [id number] Class: Historical Med  . Order #: [id number] Class: Historical Med  . Order #: [id number] Class: Historical Med  . Order #: [id number] Class: Historical Med  . Order #: [id number] Class: Historical Med  . Order #: [id number] Class: Historical Med  . Order #: [id number] Class: Historical Med  . Order #: [id number] Class: Historical Med  . Order #: [id number] Class: Historical Med  . Order #: [id number] Class: Historical Med  . Order #: [id number] Class: Historical Med      Allergies:  Zolpidem; Belladonna alkaloids; and Sulfa antibiotics  Review of Systems  Constitutional: Negative for chills and fever.  Eyes: Negative for blurred vision.  Respiratory: Negative for cough, hemoptysis, sputum production, shortness of breath and wheezing.   Gastrointestinal: Negative for heartburn.  Genitourinary: Negative for dysuria.  Musculoskeletal: Negative for myalgias.  Skin: Negative for rash.  Neurological: Negative for headaches.  Endo/Heme/Allergies: Does not bruise/bleed easily.  Psychiatric/Behavioral: Negative for depression.     Physical Examination:   VS: BP 124/68 (BP Location: Left Arm, Cuff Size: Normal)   Pulse 97   Ht '5\' 6"'$  (1.676 m)   Wt 147 lb 6.4 oz (66.9 kg)   SpO2 98%   BMI 23.79 kg/m   General Appearance: No distress  Neuro:without focal findings, mental status, speech normal, alert and oriented, cranial nerves 2-12 intact, reflexes normal and symmetric, sensation grossly normal  HEENT: PERRLA, EOM intact, no ptosis, no other lesions noticed; Mallampati 3 Pulmonary: normal breath sounds., diaphragmatic excursion normal.No wheezing, No rales;   Sputum Production:   CardiovascularNormal S1,S2.  No m/r/g.  Abdominal aorta pulsation  normal.    Abdomen: Benign, Soft, non-tender, No masses, hepatosplenomegaly, No lymphadenopathy Renal:  No costovertebral tenderness  GU:  No performed at this time. Endoc: No evident thyromegaly, no signs of acromegaly or Cushing features Skin:   warm, no rashes, no ecchymosis  Extremities: normal, no cyanosis, clubbing, no edema, warm with normal capillary refill. Other findings:none   Labs results:   Rad results: (The following images and results were reviewed by Dr. Stevenson Clinch on 07/08/2016). CT Chest 06/24/2016 FINDINGS: Mediastinum/Lymph Nodes: Heart size is normal. There is no significant pericardial fluid, thickening or pericardial calcification. There is aortic atherosclerosis, as well as atherosclerosis of the great vessels of the mediastinum and the coronary arteries, including calcified atherosclerotic plaque in the left main, left anterior descending, left circumflex and right coronary arteries. Calcifications of the mitral valve. No pathologically enlarged mediastinal or hilar lymph nodes. Esophagus is unremarkable in appearance. No axillary lymphadenopathy.  Lungs/Pleura: Diffuse bronchial wall thickening with moderate to severe centrilobular emphysema. Extensive airspace consolidation and septal thickening noted throughout the right upper lobe and dependent portions of the right lower lobe. Throughout these areas there is predominantly ground-glass attenuation, although there are some more focally consolidated regions (particularly in the dependent portions of the right lower lobe). In addition, there are several nodular appearing areas, most concerning of which is a pleural-based nodule in the periphery of the right upper lobe (axial image 70 of series 3 and coronal image 65 of series 5) which measures 1.4 x 1.7 x 1.8 cm and demonstrates macrolobulated and spiculated margins. One of these right upper lobe nodules also demonstrates a tiny focus of internal cavitation  (axial image 80 of series 3). No pleural effusions.  Upper Abdomen: Aortic atherosclerosis, including incompletely visualized aneurysmal dilatation of the infrarenal abdominal aorta which measures up to at least 3.2 cm in diameter.  Musculoskeletal/Soft Tissues: Old T12 compression fracture with approximately 70% loss of anterior vertebral body height is unchanged. There are no aggressive appearing lytic or blastic lesions noted in the visualized portions of the skeleton.  IMPRESSION: 1. There are findings throughout the right lung which are favored to reflect resolving multilobar pneumonia involving the right upper lobe and right lower lobe. Given the patient's history of hemoptysis, certainly some of the parenchymal findings in the lungs may reflect areas of resolving alveolar hemorrhage. 2. In addition, there are several nodular appearing regions in the right lung, most evident in the right upper lobe. While these may simply be infectious or inflammatory (particularly given the small cavitary area in one of the right upper lobe nodules), the possibility of neoplasm is not excluded. The most concerning of these nodules is a 1.4 x 1.7 x 1.8 cm nodule in the periphery of the right upper lobe on axial image 70 of series 3. Further evaluation with repeat noncontrast chest CT is recommended in 2-3 weeks after empiric will trial of antimicrobial therapy if clinically appropriate. 3. Aortic atherosclerosis, in addition to left main and 3 vessel coronary artery disease. Please note that although the presence of coronary artery calcium documents the presence of coronary artery disease, the severity of this disease and any potential  stenosis cannot be assessed on this non-gated CT examination. Assessment for potential risk factor modification, dietary therapy or pharmacologic therapy may be warranted, if clinically indicated. These results were called by telephone at the time of  interpretation on 06/24/2016 at 11:44 am to Dr. Wallene Huh, who verbally acknowledged these results.    Assessment and Plan: 65 year old male with recent episode of hemoptysis, now resolved, found to have right upper lobe pulmonary lesion suspicious for malignancy Hemoptysis Secondary to right-sided pneumonia Review of x-ray and CAT scans performed There are 2 pulmonary nodules that are suspicious for possible malignancy. However, patient with good clinical improvement and no further episodes of hemoptysis at this time. I have discussed the case with the patient and his primary pulmonologist, Dr. Vella Kohler; at this time we have all agreed for close monitoring with surveillance CT scan in 2 weeks.  Pneumonia Right-sided pneumonia with multiple groundglass opacities and 2 pulmonary nodules. Completed course of antibiotics and steroids. Prednisone taper over 12 days, Levaquin 10 days-completed  Plan: -Repeat CT chest in 2 weeks  Pulmonary nodules 2 pulmonary nodules noted in the right upper lobe, one is for a close to the pleura the other one is consolidated both are less than 2 cm overall. These were associated with hemoptysis and cough, he is high risk for malignancy given the location and appearance of the nodules. However, patient with good clinical improvement, no further episodes of hemoptysis, cough. Given the clinical improvement, bronchoscopy will be withheld. I have discussed the case with his primary pulmonologist, Dr. Vella Kohler, and with the patient.  Plan: -Surveillance CT scan in 2 weeks -If repeat CAT scan is worse, then we will proceed with bronchoscopy with possible biopsy at that time.  Abnormal CT of the chest Multiple groundglass opacities within the right side, this seems to be a linear pattern or a well-defined area of the groundglass opacity. There are also 2 pulmonary nodules noted in the right upper lobe, one is almost abutting the pleura. Vasculitis workup  has been performed by Dr. Raul Del, currently vasculitis labs are negative, and his aspirin has since stopped. Antimyeloperoxidase negative, anti-proteinase 3 negative, c-ANCA negative, p-ANCA negative, anti-GBM negative, sedimentation rate 17.  Plan: -Repeat CT chest in 2 weeks, bronchoscopy pending results of repeat CAT scan   Updated Medication List Outpatient Encounter Prescriptions as of 07/08/2016  Medication Sig  . albuterol (PROVENTIL HFA;VENTOLIN HFA) 108 (90 Base) MCG/ACT inhaler Inhale into the lungs.  . ALPRAZolam (XANAX) 0.5 MG tablet Take by mouth.  Marland Kitchen atorvastatin (LIPITOR) 40 MG tablet Take by mouth.  . clopidogrel (PLAVIX) 75 MG tablet Take by mouth.  . doxepin (SINEQUAN) 10 MG capsule   . Fluticasone-Salmeterol (ADVAIR DISKUS) 500-50 MCG/DOSE AEPB INHALE ONE DOSE BY MOUTH EVERY 12 HOURS  . furosemide (LASIX) 80 MG tablet TAKE ONE TABLET BY MOUTH ONCE DAILY  . gabapentin (NEURONTIN) 300 MG capsule Take by mouth.  Marland Kitchen HYDROcodone-acetaminophen (NORCO) 7.5-325 MG tablet Take by mouth.  . metoprolol succinate (TOPROL-XL) 100 MG 24 hr tablet TAKE ONE TABLET BY MOUTH ONCE DAILY  . pantoprazole (PROTONIX) 40 MG tablet Take by mouth.  . primidone (MYSOLINE) 250 MG tablet TAKE ONE TABLET BY MOUTH THREE TIMES DAILY  . prochlorperazine (COMPAZINE) 10 MG tablet Take by mouth.  . sucralfate (CARAFATE) 1 g tablet Take by mouth.  . tamsulosin (FLOMAX) 0.4 MG CAPS capsule TAKE ONE CAPSULE BY MOUTH ONCE DAILY 30 MINUTES AFTER THE SAME MEAL EACH DAY  . tiotropium (SPIRIVA HANDIHALER) 18 MCG inhalation  capsule INHALE ONE DOSE BY MOUTH ONCE DAILY   No facility-administered encounter medications on file as of 07/08/2016.     Orders for this visit: No orders of the defined types were placed in this encounter.    Thank  you for the consultation and for allowing [facility] to assist in the care of your patient. Our recommendations are noted above.  Please contact us if we  can be of further service.   Vilinda Boehringer, MD [facility] Office Number: [phone]  Note: This note was prepared with Dragon dictation along with smaller phrase technology. Any transcriptional errors that result from this process are unintentional.

## 2016-07-08 NOTE — Patient Instructions (Signed)
Follow up with Dr. Raul Del in 2 wks - keep current appointment for your CT Chest scan on 07/22/16 - cont to avoid tobacco - if repeat CT chest is not improving, then we will consider bronchoscopy with biopsy - this will also be discussed with Dr. Raul Del.

## 2016-07-22 ENCOUNTER — Ambulatory Visit
Admission: RE | Admit: 2016-07-22 | Discharge: 2016-07-22 | Disposition: A | Discharge status: Home / Self Care | Payer: Medicare Other | Source: Ambulatory Visit | Attending: Specialist | Admitting: Specialist

## 2016-07-22 DIAGNOSIS — R911 Solitary pulmonary nodule: Secondary | ICD-10-CM | POA: Insufficient documentation

## 2016-07-22 DIAGNOSIS — I2510 Atherosclerotic heart disease of native coronary artery without angina pectoris: Secondary | ICD-10-CM | POA: Insufficient documentation

## 2016-07-22 DIAGNOSIS — I700 Atherosclerosis of aorta: Secondary | ICD-10-CM | POA: Diagnosis not present

## 2016-07-22 DIAGNOSIS — J432 Centrilobular emphysema: Secondary | ICD-10-CM | POA: Insufficient documentation

## 2016-07-22 DIAGNOSIS — R938 Abnormal findings on diagnostic imaging of other specified body structures: Secondary | ICD-10-CM | POA: Insufficient documentation

## 2016-07-22 DIAGNOSIS — R918 Other nonspecific abnormal finding of lung field: Secondary | ICD-10-CM

## 2016-07-23 ENCOUNTER — Other Ambulatory Visit: Payer: Self-pay | Admitting: Specialist

## 2016-07-23 DIAGNOSIS — R911 Solitary pulmonary nodule: Secondary | ICD-10-CM

## 2016-09-17 DIAGNOSIS — E538 Deficiency of other specified B group vitamins: Secondary | ICD-10-CM | POA: Insufficient documentation

## 2016-10-23 ENCOUNTER — Ambulatory Visit
Admission: RE | Admit: 2016-10-23 | Discharge: 2016-10-23 | Disposition: A | Discharge status: Home / Self Care | Payer: Medicare Other | Source: Ambulatory Visit | Attending: Specialist | Admitting: Specialist

## 2016-10-23 DIAGNOSIS — R911 Solitary pulmonary nodule: Secondary | ICD-10-CM | POA: Diagnosis not present

## 2016-11-03 ENCOUNTER — Other Ambulatory Visit: Payer: Self-pay | Admitting: Specialist

## 2016-11-03 DIAGNOSIS — R918 Other nonspecific abnormal finding of lung field: Secondary | ICD-10-CM

## 2016-11-12 ENCOUNTER — Ambulatory Visit
Admission: RE | Admit: 2016-11-12 | Discharge: 2016-11-12 | Disposition: A | Discharge status: Home / Self Care | Payer: Medicare Other | Source: Ambulatory Visit | Attending: Specialist | Admitting: Specialist

## 2016-11-12 DIAGNOSIS — I719 Aortic aneurysm of unspecified site, without rupture: Secondary | ICD-10-CM | POA: Insufficient documentation

## 2016-11-12 DIAGNOSIS — J439 Emphysema, unspecified: Secondary | ICD-10-CM | POA: Diagnosis not present

## 2016-11-12 DIAGNOSIS — I2510 Atherosclerotic heart disease of native coronary artery without angina pectoris: Secondary | ICD-10-CM | POA: Insufficient documentation

## 2016-11-12 DIAGNOSIS — I700 Atherosclerosis of aorta: Secondary | ICD-10-CM | POA: Diagnosis not present

## 2016-11-12 DIAGNOSIS — R918 Other nonspecific abnormal finding of lung field: Secondary | ICD-10-CM | POA: Diagnosis not present

## 2016-11-12 LAB — GLUCOSE, CAPILLARY: GLUCOSE-CAPILLARY: 58 mg/dL — AB (ref 65–99)

## 2016-11-12 MED ORDER — FLUDEOXYGLUCOSE F - 18 (FDG) INJECTION
12.8800 | Freq: Once | INTRAVENOUS | Status: AC | PRN
  Administered 2016-11-12: 12.88 via INTRAVENOUS

## 2016-11-25 ENCOUNTER — Other Ambulatory Visit: Payer: Self-pay

## 2016-11-25 DIAGNOSIS — D649 Anemia, unspecified: Secondary | ICD-10-CM | POA: Insufficient documentation

## 2016-11-25 DIAGNOSIS — K219 Gastro-esophageal reflux disease without esophagitis: Secondary | ICD-10-CM | POA: Insufficient documentation

## 2016-11-25 DIAGNOSIS — J449 Chronic obstructive pulmonary disease, unspecified: Secondary | ICD-10-CM | POA: Insufficient documentation

## 2016-11-26 ENCOUNTER — Other Ambulatory Visit: Payer: Self-pay

## 2016-11-27 LAB — PULMONARY FUNCTION TEST

## 2016-11-28 ENCOUNTER — Encounter: Payer: Self-pay | Admitting: Cardiothoracic Surgery

## 2016-11-28 ENCOUNTER — Ambulatory Visit (INDEPENDENT_AMBULATORY_CARE_PROVIDER_SITE_OTHER): Payer: Medicare Other | Admitting: Cardiothoracic Surgery

## 2016-11-28 VITALS — BP 91/52 | HR 120 | Temp 98.7°F | Ht 68.0 in | Wt 143.0 lb

## 2016-11-28 DIAGNOSIS — R918 Other nonspecific abnormal finding of lung field: Secondary | ICD-10-CM | POA: Diagnosis not present

## 2016-11-28 NOTE — Patient Instructions (Signed)
We will refer you to see Dr. Baruch Gouty (Radiation Oncologist) at the [facility]. Listen to what he has to tell you. Then after the visit you will the decide what is best.

## 2016-11-28 NOTE — Progress Notes (Signed)
Patient ID: Ryan Jarvis, male   DOB: 10-26-1951, 65 y.o.   MRN: [id number]  Chief Complaint  Patient presents with  . Other    Pt: lung mass RUL    Referred By Dr. Raul Del Reason for Referral persistent right upper lobe mass  HPI Location, Quality, Duration, Severity, Timing, Context, Modifying Factors, Associated Signs and Symptoms.  Ryan Jarvis is a 65 y.o. male.  He was in his usual state of health until August of issue when he developed an episode of hemoptysis. At the time he was taking aspirin and Plavix for cardiac stent and the aspirin was stopped. He is also evaluated with a chest x-ray which showed pneumonia in the right lung and he was given antibiotics. A CT scan performed at that time revealed extensive pneumonia within the right lung and there were several small nodules. A follow-up CT scan 3 months later demonstrated persistence of the more superior nodule. The patient's hemoptysis has stopped. He had a PET scan done which showed intense uptake in the right upper lobe nodule. The patient states that he gets markedly short of breath with minimal activities. He has a long-standing history of COPD and smoked for many years up until 4 years ago. He has some pulmonary function studies performed recently which reveal a FEV1 of about 65% and a DLCO of about 45%. He does use oxygen at night. He denied any recent hemoptysis, weight loss or poor appetite.   Past Medical History:  Diagnosis Date  . Hypertension     Past Surgical History:  Procedure Laterality Date  . BACK SURGERY    . STENT REMOVAL      Family History  Problem Relation Age of Onset  . Congestive Heart Failure Mother   . Congestive Heart Failure Father     Social History Social History  Substance Use Topics  . Smoking status: Former Smoker    Packs/day: 1.00    Years: 49.00    Types: Cigarettes  . Smokeless tobacco: Never Used     Comment: quit smoking in 2013  . Alcohol use No    Allergies   Allergen Reactions  . Zolpidem Other (See Comments)    agitation  . Belladonna Alkaloids   . Sulfa Antibiotics     Current Outpatient Prescriptions  Medication Sig Dispense Refill  . albuterol (PROVENTIL HFA;VENTOLIN HFA) 108 (90 Base) MCG/ACT inhaler Inhale into the lungs.    . ALPRAZolam (XANAX) 0.5 MG tablet Take by mouth.    Marland Kitchen atorvastatin (LIPITOR) 40 MG tablet Take by mouth.    . budesonide-formoterol (SYMBICORT) 160-4.5 MCG/ACT inhaler Inhale 2 puffs into the lungs 2 (two) times daily.    . Cholecalciferol (VITAMIN D-1000 MAX ST) 1000 units tablet Take 1 tablet by mouth 1 day or 1 dose.    . clopidogrel (PLAVIX) 75 MG tablet Take by mouth.    . doxepin (SINEQUAN) 10 MG capsule     . DULoxetine (CYMBALTA) 30 MG capsule Take 1 capsule by mouth 1 day or 1 dose.    Marland Kitchen Fluticasone-Salmeterol (ADVAIR DISKUS) 500-50 MCG/DOSE AEPB INHALE ONE DOSE BY MOUTH EVERY 12 HOURS    . furosemide (LASIX) 80 MG tablet TAKE ONE TABLET BY MOUTH ONCE DAILY    . gabapentin (NEURONTIN) 300 MG capsule Take by mouth.    Derrill Memo ON 12/15/2016] HYDROcodone-acetaminophen (NORCO) 7.5-325 MG tablet Take 1 tablet by mouth every 6 (six) hours as needed.    . metolazone (ZAROXOLYN) 5 MG tablet Take  1 tablet by mouth 1 day or 1 dose.    . metoprolol succinate (TOPROL-XL) 100 MG 24 hr tablet TAKE ONE TABLET BY MOUTH ONCE DAILY    . pantoprazole (PROTONIX) 40 MG tablet Take by mouth.    . potassium chloride SA (K-DUR,KLOR-CON) 20 MEQ tablet Take 1 tablet by mouth 1 day or 1 dose.    . primidone (MYSOLINE) 250 MG tablet TAKE ONE TABLET BY MOUTH THREE TIMES DAILY    . prochlorperazine (COMPAZINE) 10 MG tablet Take by mouth.    . sucralfate (CARAFATE) 1 g tablet Take by mouth.    . tamsulosin (FLOMAX) 0.4 MG CAPS capsule TAKE ONE CAPSULE BY MOUTH ONCE DAILY 30 MINUTES AFTER THE SAME MEAL EACH DAY    . theophylline (UNIPHYL) 400 MG 24 hr tablet Take 1 tablet by mouth 1 day or 1 dose.    . tiotropium (SPIRIVA HANDIHALER)  18 MCG inhalation capsule INHALE ONE DOSE BY MOUTH ONCE DAILY     No current facility-administered medications for this visit.       Review of Systems A complete review of systems was asked and was negative except for the following positive findings shortness of breath,   Blood pressure (!) 91/52, pulse (!) 120, temperature 98.7 F (37.1 C), temperature source Oral, height '5\' 8"'$  (1.727 m), weight 143 lb (64.9 kg).  Physical Exam CONSTITUTIONAL:  Pleasant, well-developed, well-nourished, and in no acute distress. EYES: Pupils equal and reactive to light, Sclera non-icteric EARS, NOSE, MOUTH AND THROAT:  The oropharynx was clear.  Dentition is good repair.  Oral mucosa pink and moist. LYMPH NODES:  Lymph nodes in the neck and axillae were normal RESPIRATORY:  Lungs were clear but very distant.  Normal respiratory effort without pathologic use of accessory muscles of respiration CARDIOVASCULAR: Heart was regular without murmurs.  There were no carotid bruits. GI: The abdomen was soft, nontender, and nondistended. There were no palpable masses. There was no hepatosplenomegaly. There were normal bowel sounds in all quadrants. GU:  Rectal deferred.   MUSCULOSKELETAL:  Normal muscle strength and tone.  No cyanosis but extensive clubbing of the digits. SKIN:  There were no pathologic skin lesions.  There were no nodules on palpation. NEUROLOGIC:  Sensation is normal.  Cranial nerves are grossly intact. PSYCH:  Oriented to person, place and time.  Mood and affect are normal.  Data Reviewed Multiple CT scans and PET scans  I have personally reviewed the patient's imaging, laboratory findings and medical records.    Assessment    I have independently reviewed the patient's CT scan. There is a irregularly shaped mass in the right upper lobe. Upon further inspection there was a small nodule in this area back in 2014. 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 I also reviewed his pulmonary function studies and on the basis of his underlying physiologic conditions I find that he is a poor surgical candidate.    Plan    I discussed with him in detail the indications and risks of surgery. I reviewed with him the options of radiation therapy. I reviewed the advantages and disadvantages of the various treatment techniques. I would like to get Dr. Leanord Asal opinion regarding his candidacy for SBRT.  We will make an appointment for him to follow-up with Dr. Donella Stade. I gave the patient my business card and told to contact me if he has a  further questions. If Dr. Donella Stade does not believe that he is a good candidate for radiation therapy certainly surgery can be addressed again.       Nestor Lewandowsky, MD 11/28/2016, 9:44 AM

## 2016-12-10 ENCOUNTER — Encounter: Payer: Self-pay | Admitting: Radiation Oncology

## 2016-12-10 ENCOUNTER — Other Ambulatory Visit: Payer: Self-pay | Admitting: *Deleted

## 2016-12-10 ENCOUNTER — Ambulatory Visit
Admission: RE | Admit: 2016-12-10 | Discharge: 2016-12-10 | Disposition: A | Discharge status: Home / Self Care | Payer: Medicare Other | Source: Ambulatory Visit | Attending: Radiation Oncology | Admitting: Radiation Oncology

## 2016-12-10 VITALS — BP 124/81 | HR 96 | Temp 95.1°F | Wt 146.6 lb

## 2016-12-10 DIAGNOSIS — J439 Emphysema, unspecified: Secondary | ICD-10-CM | POA: Insufficient documentation

## 2016-12-10 DIAGNOSIS — R251 Tremor, unspecified: Secondary | ICD-10-CM | POA: Diagnosis not present

## 2016-12-10 DIAGNOSIS — Z87891 Personal history of nicotine dependence: Secondary | ICD-10-CM | POA: Diagnosis not present

## 2016-12-10 DIAGNOSIS — R918 Other nonspecific abnormal finding of lung field: Secondary | ICD-10-CM | POA: Insufficient documentation

## 2016-12-10 DIAGNOSIS — Z7951 Long term (current) use of inhaled steroids: Secondary | ICD-10-CM | POA: Insufficient documentation

## 2016-12-10 DIAGNOSIS — J449 Chronic obstructive pulmonary disease, unspecified: Secondary | ICD-10-CM | POA: Insufficient documentation

## 2016-12-10 DIAGNOSIS — Z79899 Other long term (current) drug therapy: Secondary | ICD-10-CM | POA: Diagnosis not present

## 2016-12-10 DIAGNOSIS — I10 Essential (primary) hypertension: Secondary | ICD-10-CM | POA: Insufficient documentation

## 2016-12-10 HISTORY — DX: Emphysema, unspecified: J43.9

## 2016-12-10 HISTORY — DX: Chronic obstructive pulmonary disease, unspecified: J44.9

## 2016-12-10 HISTORY — DX: Tremor, unspecified: R25.1

## 2016-12-10 NOTE — Consult Note (Signed)
NEW PATIENT EVALUATION  Name: Ryan Jarvis  MRN: [id number]  Date:   12/10/2016     DOB: 1951-08-24   This 65 y.o. 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  REFERRING PHYSICIAN: Adin Hector, MD  CHIEF COMPLAINT:  Chief Complaint  Patient presents with  . Lung Mass    Initial Evaluation    DIAGNOSIS: The encounter diagnosis was Lung mass.   PREVIOUS INVESTIGATIONS:  PET scan and CT scans reviewed CT-guided fine-needle aspiration ordered Clinical notes reviewed  HPI: Patient is a 65 year old male with history of COPD oxygen dependent at night many years smoking history who presented in August with an episode of hemoptysis. He has cardiac stents was on aspirin and Plavix which was stopped and a chest x-ray showed a right upper lobe lung lesion. He also had pneumonia which has cleared over time with antibiotic therapy. Follow-up CT scan 3 months later showed right upper lobe nodule and PET scan was performed showing intense hypermetabolic uptake in this nodule. He has been seen by surgeon and based on his FEV1 of 65% of predicted and DLCO of 45% not thought to be a surgical candidate. He is seen today for radiation oncology opinion. He is doing fairly well. He specifically denies recent hemoptysis bone pain or weight loss.  PLANNED TREATMENT REGIMEN: Possible SB RT  PAST MEDICAL HISTORY:  has a past medical history of COPD (chronic obstructive pulmonary disease) ([place]); Emphysema of lung ([place]); Hypertension; and Tremors of nervous system.    PAST SURGICAL HISTORY:  Past Surgical History:  Procedure Laterality Date  . BACK SURGERY    . STENT REMOVAL      FAMILY HISTORY: family history includes Congestive Heart Failure in his father and mother.  SOCIAL HISTORY:  reports that he has quit smoking. His smoking use included Cigarettes. He has a 49.00 pack-year smoking history. He has never  used smokeless tobacco. He reports that he does not drink alcohol or use drugs.  ALLERGIES: Zolpidem; Belladonna alkaloids; and Sulfa antibiotics  MEDICATIONS:  Current Outpatient Prescriptions  Medication Sig Dispense Refill  . albuterol (PROVENTIL HFA;VENTOLIN HFA) 108 (90 Base) MCG/ACT inhaler Inhale into the lungs.    . ALPRAZolam (XANAX) 0.5 MG tablet Take by mouth.    Marland Kitchen atorvastatin (LIPITOR) 40 MG tablet Take by mouth.    . budesonide-formoterol (SYMBICORT) 160-4.5 MCG/ACT inhaler Inhale 2 puffs into the lungs 2 (two) times daily.    . Cholecalciferol (VITAMIN D-1000 MAX ST) 1000 units tablet Take 1 tablet by mouth 1 day or 1 dose.    . clopidogrel (PLAVIX) 75 MG tablet Take by mouth.    . doxepin (SINEQUAN) 10 MG capsule     . DULoxetine (CYMBALTA) 30 MG capsule Take 1 capsule by mouth 1 day or 1 dose.    Marland Kitchen Fluticasone-Salmeterol (ADVAIR DISKUS) 500-50 MCG/DOSE AEPB INHALE ONE DOSE BY MOUTH EVERY 12 HOURS    . furosemide (LASIX) 80 MG tablet TAKE ONE TABLET BY MOUTH ONCE DAILY    . gabapentin (NEURONTIN) 300 MG capsule Take by mouth.    Derrill Memo ON 12/15/2016] HYDROcodone-acetaminophen (NORCO) 7.5-325 MG tablet Take 1 tablet by mouth every 6 (six) hours as needed.    . metolazone (ZAROXOLYN) 5 MG tablet Take 1 tablet by mouth 1 day or 1 dose.    . metoprolol succinate (TOPROL-XL) 100 MG 24 hr tablet TAKE ONE TABLET BY MOUTH ONCE  DAILY    . pantoprazole (PROTONIX) 40 MG tablet Take by mouth.    . potassium chloride SA (K-DUR,KLOR-CON) 20 MEQ tablet Take 1 tablet by mouth 1 day or 1 dose.    . primidone (MYSOLINE) 250 MG tablet TAKE ONE TABLET BY MOUTH THREE TIMES DAILY    . prochlorperazine (COMPAZINE) 10 MG tablet Take by mouth.    . sucralfate (CARAFATE) 1 g tablet Take by mouth.    . tamsulosin (FLOMAX) 0.4 MG CAPS capsule TAKE ONE CAPSULE BY MOUTH ONCE DAILY 30 MINUTES AFTER THE SAME MEAL EACH DAY    . theophylline (UNIPHYL) 400 MG 24 hr tablet Take 1 tablet by mouth 1 day or 1  dose.    . tiotropium (SPIRIVA HANDIHALER) 18 MCG inhalation capsule INHALE ONE DOSE BY MOUTH ONCE DAILY     No current facility-administered medications for this encounter.     ECOG PERFORMANCE STATUS:  0 - Asymptomatic  REVIEW OF SYSTEMS:  Patient denies any weight loss, fatigue, weakness, fever, chills or night sweats. Patient denies any loss of vision, blurred vision. Patient denies any ringing  of the ears or hearing loss. No irregular heartbeat. Patient denies heart murmur or history of fainting. Patient denies any chest pain or pain radiating to her upper extremities. Patient denies any shortness of breath, difficulty breathing at night, cough or hemoptysis. Patient denies any swelling in the lower legs. Patient denies any nausea vomiting, vomiting of blood, or coffee ground material in the vomitus. Patient denies any stomach pain. Patient states has had normal bowel movements no significant constipation or diarrhea. Patient denies any dysuria, hematuria or significant nocturia. Patient denies any problems walking, swelling in the joints or loss of balance. Patient denies any skin changes, loss of hair or loss of weight. Patient denies any excessive worrying or anxiety or significant depression. Patient denies any problems with insomnia. Patient denies excessive thirst, polyuria, polydipsia. Patient denies any swollen glands, patient denies easy bruising or easy bleeding. Patient denies any recent infections, allergies or URI. Patient "s visual fields have not changed significantly in recent time.    PHYSICAL EXAM: BP 124/81   Pulse 96   Temp (!) 95.1 F (35.1 C)   Wt 146 lb 9.7 oz (66.5 kg)   BMI 22.29 kg/m  Well-developed well-nourished patient in NAD. HEENT reveals PERLA, EOMI, discs not visualized.  Oral cavity is clear. No oral mucosal lesions are identified. Neck is clear without evidence of cervical or supraclavicular adenopathy. Lungs are clear to A&P. Cardiac examination is  essentially unremarkable with regular rate and rhythm without murmur rub or thrill. Abdomen is benign with no organomegaly or masses noted. Motor sensory and DTR levels are equal and symmetric in the upper and lower extremities. Cranial nerves II through XII are grossly intact. Proprioception is intact. No peripheral adenopathy or edema is identified. No motor or sensory levels are noted. Crude visual fields are within normal range.  LABORATORY DATA: Fine-needle aspiration CT-guided has been ordered    RADIOLOGY RESULTS: CT scan PET CT scans reviewed   IMPRESSION: Probable stage I non-small cell lung cancer favoring low-grade adenocarcinoma in 65 year old male  PLAN: At this time I ordered attempted fine-needle aspiration CT-guided by radiology. If they were unable to do that based on his pulmonary functions or risk would recommend SB RT based on the clinical assessment of the rationale this is a slow-growing lung cancer based on PET CT criteria. If this is a and adenocarcinoma again would agree to  5000 cGy in 5 fractions using SB RT. Risks and benefits of treatment including fatigue possible alteration of blood counts possible development of cough and slight chance of radiation esophagitis all were discussed in detail with the patient and his wife. I will reevaluate the patient after attempted fine-needle aspiration. Will go ahead at that time and plan CT simulation.  I would like to take this opportunity to thank you for allowing me to participate in the care of your patient.Armstead Peaks., MD

## 2016-12-16 ENCOUNTER — Other Ambulatory Visit: Payer: Self-pay | Admitting: *Deleted

## 2016-12-18 ENCOUNTER — Inpatient Hospital Stay: Payer: Medicare Other | Attending: Radiation Oncology

## 2016-12-18 ENCOUNTER — Other Ambulatory Visit: Payer: Self-pay | Admitting: *Deleted

## 2016-12-18 DIAGNOSIS — R918 Other nonspecific abnormal finding of lung field: Secondary | ICD-10-CM

## 2016-12-22 ENCOUNTER — Other Ambulatory Visit: Payer: Self-pay | Admitting: *Deleted

## 2016-12-23 ENCOUNTER — Other Ambulatory Visit: Payer: Self-pay | Admitting: Radiology

## 2016-12-24 ENCOUNTER — Ambulatory Visit
Admission: RE | Admit: 2016-12-24 | Discharge: 2016-12-24 | Disposition: A | Discharge status: Home / Self Care | Payer: Medicare Other | Source: Ambulatory Visit | Attending: Interventional Radiology | Admitting: Interventional Radiology

## 2016-12-24 ENCOUNTER — Ambulatory Visit
Admission: RE | Admit: 2016-12-24 | Discharge: 2016-12-24 | Disposition: A | Discharge status: Home / Self Care | Payer: Medicare Other | Source: Ambulatory Visit | Attending: Radiation Oncology | Admitting: Radiation Oncology

## 2016-12-24 ENCOUNTER — Encounter: Payer: Self-pay | Admitting: Cardiothoracic Surgery

## 2016-12-24 DIAGNOSIS — J95811 Postprocedural pneumothorax: Secondary | ICD-10-CM

## 2016-12-24 DIAGNOSIS — I10 Essential (primary) hypertension: Secondary | ICD-10-CM | POA: Diagnosis not present

## 2016-12-24 DIAGNOSIS — K219 Gastro-esophageal reflux disease without esophagitis: Secondary | ICD-10-CM | POA: Insufficient documentation

## 2016-12-24 DIAGNOSIS — C3411 Malignant neoplasm of upper lobe, right bronchus or lung: Secondary | ICD-10-CM | POA: Diagnosis not present

## 2016-12-24 DIAGNOSIS — I252 Old myocardial infarction: Secondary | ICD-10-CM | POA: Insufficient documentation

## 2016-12-24 DIAGNOSIS — J449 Chronic obstructive pulmonary disease, unspecified: Secondary | ICD-10-CM | POA: Insufficient documentation

## 2016-12-24 DIAGNOSIS — F419 Anxiety disorder, unspecified: Secondary | ICD-10-CM | POA: Diagnosis not present

## 2016-12-24 DIAGNOSIS — R918 Other nonspecific abnormal finding of lung field: Secondary | ICD-10-CM | POA: Diagnosis present

## 2016-12-24 DIAGNOSIS — Z79899 Other long term (current) drug therapy: Secondary | ICD-10-CM | POA: Diagnosis not present

## 2016-12-24 DIAGNOSIS — Z87891 Personal history of nicotine dependence: Secondary | ICD-10-CM | POA: Insufficient documentation

## 2016-12-24 DIAGNOSIS — Z7902 Long term (current) use of antithrombotics/antiplatelets: Secondary | ICD-10-CM | POA: Insufficient documentation

## 2016-12-24 HISTORY — DX: Dyspnea, unspecified: R06.00

## 2016-12-24 HISTORY — DX: Malignant (primary) neoplasm, unspecified: C80.1

## 2016-12-24 HISTORY — DX: Acute myocardial infarction, unspecified: I21.9

## 2016-12-24 HISTORY — DX: Gastro-esophageal reflux disease without esophagitis: K21.9

## 2016-12-24 HISTORY — DX: Unspecified osteoarthritis, unspecified site: M19.90

## 2016-12-24 HISTORY — DX: Anxiety disorder, unspecified: F41.9

## 2016-12-24 LAB — CBC
HEMATOCRIT: 40.8 % (ref 40.0–52.0)
HEMOGLOBIN: 14.1 g/dL (ref 13.0–18.0)
MCH: 31.9 pg (ref 26.0–34.0)
MCHC: 34.5 g/dL (ref 32.0–36.0)
MCV: 92.4 fL (ref 80.0–100.0)
Platelets: 276 10*3/uL (ref 150–440)
RBC: 4.42 MIL/uL (ref 4.40–5.90)
RDW: 15.3 % — ABNORMAL HIGH (ref 11.5–14.5)
WBC: 5.3 10*3/uL (ref 3.8–10.6)

## 2016-12-24 LAB — APTT: aPTT: 29 seconds (ref 24–36)

## 2016-12-24 LAB — PROTIME-INR
INR: 0.94
PROTHROMBIN TIME: 12.6 s (ref 11.4–15.2)

## 2016-12-24 MED ORDER — FENTANYL CITRATE (PF) 100 MCG/2ML IJ SOLN
INTRAMUSCULAR | Status: AC | PRN
  Administered 2016-12-24 (×3): 50 ug via INTRAVENOUS

## 2016-12-24 MED ORDER — SODIUM CHLORIDE 0.9 % IV SOLN
INTRAVENOUS | Status: DC
  Administered 2016-12-24: 08:00:00 via INTRAVENOUS

## 2016-12-24 MED ORDER — HYDROCODONE-ACETAMINOPHEN 5-325 MG PO TABS
1.0000 | ORAL_TABLET | ORAL | Status: DC | PRN
  Administered 2016-12-24: 1 via ORAL
  Filled 2016-12-24 (×2): qty 2

## 2016-12-24 MED ORDER — MIDAZOLAM HCL 2 MG/2ML IJ SOLN
INTRAMUSCULAR | Status: AC | PRN
  Administered 2016-12-24 (×3): 1 mg via INTRAVENOUS

## 2016-12-24 NOTE — H&P (Signed)
Chief Complaint: Patient was seen in consultation today for biopsy of a RUL lung nodule at the request of Call[place]  Referring Physician(s): Chrystal,Florella Mcneese  Patient Status: [facility] - Out-pt  History of Present Illness: Ryan Jarvis is a 66 y.o. male with a history of smoking and COPD with a roughly 1.5 cm predominantly solid/partly ground glass nodule of the RUL demonstrating increased metabolic activity by PET.  Has been deemed to not be a candidate for surgical resection by Dr. Genevive Bi.  Needs tissue sampling and diagnosis prior to planned SBRT by Dr. Baruch Gouty.  Now presents for CT guided biopsy.  Presented in multidisciplinary Cancer Conference last Thursday.  Past Medical History:  Diagnosis Date  . Anxiety   . Arthritis   . Cancer ([place])   . COPD (chronic obstructive pulmonary disease) ([place])   . Dyspnea   . Emphysema of lung ([place])   . GERD (gastroesophageal reflux disease)   . Hypertension   . Myocardial infarction   . Tremors of nervous system     Past Surgical History:  Procedure Laterality Date  . BACK SURGERY    . CORONARY ANGIOPLASTY    . STENT REMOVAL      Allergies: Zolpidem; Belladonna alkaloids; and Sulfa antibiotics  Medications: Prior to Admission medications   Medication Sig Start Date End Date Taking? Authorizing Provider  albuterol (PROVENTIL HFA;VENTOLIN HFA) 108 (90 Base) MCG/ACT inhaler Inhale into the lungs. 03/19/16 03/19/17 Yes Historical Provider, MD  ALPRAZolam Duanne Moron) 0.5 MG tablet Take by mouth. 03/19/16  Yes Historical Provider, MD  atorvastatin (LIPITOR) 40 MG tablet Take by mouth. 02/13/16 02/12/17 Yes Historical Provider, MD  budesonide-formoterol (SYMBICORT) 160-4.5 MCG/ACT inhaler Inhale 2 puffs into the lungs 2 (two) times daily. 11/14/16 11/14/17 Yes Historical Provider, MD  Cholecalciferol (VITAMIN D-1000 MAX ST) 1000 units tablet Take 1 tablet by mouth 1 day or 1 dose.   Yes Historical Provider, MD  doxepin (SINEQUAN) 10 MG capsule   12/03/14  Yes Historical Provider, MD  DULoxetine (CYMBALTA) 30 MG capsule Take 1 capsule by mouth 1 day or 1 dose. 09/11/16  Yes Historical Provider, MD  Fluticasone-Salmeterol (ADVAIR DISKUS) 500-50 MCG/DOSE AEPB INHALE ONE DOSE BY MOUTH EVERY 12 HOURS 01/28/16  Yes Historical Provider, MD  furosemide (LASIX) 80 MG tablet TAKE ONE TABLET BY MOUTH ONCE DAILY 06/12/16  Yes Historical Provider, MD  gabapentin (NEURONTIN) 300 MG capsule Take by mouth. 04/28/16 04/28/17 Yes Historical Provider, MD  HYDROcodone-acetaminophen (NORCO) 7.5-325 MG tablet Take 1 tablet by mouth every 6 (six) hours as needed. 12/15/16  Yes Historical Provider, MD  metolazone (ZAROXOLYN) 5 MG tablet Take 1 tablet by mouth 1 day or 1 dose. 09/11/16  Yes Historical Provider, MD  metoprolol succinate (TOPROL-XL) 100 MG 24 hr tablet TAKE ONE TABLET BY MOUTH ONCE DAILY 06/12/16  Yes Historical Provider, MD  pantoprazole (PROTONIX) 40 MG tablet Take by mouth. 12/31/15  Yes Historical Provider, MD  potassium chloride SA (K-DUR,KLOR-CON) 20 MEQ tablet Take 1 tablet by mouth 1 day or 1 dose. 09/11/16  Yes Historical Provider, MD  primidone (MYSOLINE) 250 MG tablet TAKE ONE TABLET BY MOUTH THREE TIMES DAILY 04/17/16  Yes Historical Provider, MD  sucralfate (CARAFATE) 1 g tablet Take by mouth. 12/31/15  Yes Historical Provider, MD  tamsulosin (FLOMAX) 0.4 MG CAPS capsule TAKE ONE CAPSULE BY MOUTH ONCE DAILY 30 MINUTES AFTER THE SAME MEAL EACH DAY 06/12/16  Yes Historical Provider, MD  theophylline (UNIPHYL) 400 MG 24 hr tablet Take 1 tablet  by mouth 1 day or 1 dose. 09/11/16  Yes Historical Provider, MD  tiotropium (SPIRIVA HANDIHALER) 18 MCG inhalation capsule INHALE ONE DOSE BY MOUTH ONCE DAILY 11/05/15  Yes Historical Provider, MD  clopidogrel (PLAVIX) 75 MG tablet Take by mouth. 12/31/15   Historical Provider, MD  prochlorperazine (COMPAZINE) 10 MG tablet Take by mouth. 12/15/14   Historical Provider, MD     Family History  Problem Relation Age of  Onset  . Congestive Heart Failure Mother   . Congestive Heart Failure Father     Social History   Social History  . Marital status: Married    Spouse name: N/A  . Number of children: N/A  . Years of education: N/A   Social History Main Topics  . Smoking status: Former Smoker    Packs/day: 1.00    Years: 49.00    Types: Cigarettes  . Smokeless tobacco: Never Used     Comment: quit smoking in 2013  . Alcohol use No  . Drug use: No  . Sexual activity: Not Asked   Other Topics Concern  . None   Social History Narrative  . None    ECOG Status: 0 - Asymptomatic  Review of Systems: A 12 point ROS discussed and pertinent positives are indicated in the HPI above.  All other systems are negative.  Review of Systems  Constitutional: Negative.   HENT: Negative.   Respiratory: Negative.   Cardiovascular: Negative.   Gastrointestinal: Negative.   Genitourinary: Negative.   Musculoskeletal: Positive for back pain.       Chronic low back pain when standing.  Neurological: Negative.     Vital Signs: BP 119/81   Temp 97.8 F (36.6 C) (Oral)   Resp 18   SpO2 94%   Physical Exam  Constitutional: He is oriented to person, place, and time. No distress.  HENT:  Head: Normocephalic and atraumatic.  Neck: Neck supple. No JVD present. No tracheal deviation present.  Cardiovascular: Normal rate, regular rhythm and normal heart sounds.  Exam reveals no gallop and no friction rub.   No murmur heard. Pulmonary/Chest: Effort normal. No stridor. No respiratory distress. He has no wheezes. He has no rales.  Distant bilateral breath sounds.  Abdominal: Soft. Bowel sounds are normal. He exhibits no distension and no mass. There is no tenderness. There is no rebound and no guarding.  Musculoskeletal: He exhibits no edema.  Lymphadenopathy:    He has no cervical adenopathy.  Neurological: He is alert and oriented to person, place, and time.  Skin: Skin is warm and dry. He is not  diaphoretic.    Mallampati Score:  MD Evaluation Airway: WNL Heart: WNL Abdomen: WNL Chest/ Lungs: WNL ASA  Classification: 2 Mallampati/Airway Score: One  Imaging: No results found.  Labs:  CBC:  Recent Labs  12/24/16 0729  WBC 5.3  HGB 14.1  HCT 40.8  PLT 276    COAGS:  Recent Labs  12/24/16 0729  INR 0.94  APTT 29    Assessment and Plan:  For CT guided biopsy of RUL lung nodule today; by my review, the solid nodular component measures approximately 9x12 mm.  Risks and benefits discussed with the patient including, but not limited to bleeding, hemoptysis, respiratory failure requiring intubation, infection, pneumothorax requiring chest tube placement, stroke from air embolism or even death. All of the patient's questions were answered, patient is agreeable to proceed. Consent signed and in chart.  Thank you for this interesting consult.  I greatly enjoyed  meeting Boone Master and look forward to participating in their care.  A copy of this report was sent to the requesting provider on this date.  Electronically SignedAletta Edouard T 12/24/2016, 8:14 AM   I spent a total of  30 Minutes in face to face in clinical consultation, greater than 50% of which was counseling/coordinating care for biopsy of a right upper lobe lung nodule.

## 2016-12-24 NOTE — Discharge Instructions (Signed)
Needle Biopsy, Care After Introduction Refer to this sheet in the next few weeks. These instructions provide you with information about caring for yourself after your procedure. Your health care provider may also give you more specific instructions. Your treatment has been planned according to current medical practices, but problems sometimes occur. Call your health care provider if you have any problems or questions after your procedure. What can I expect after the procedure? After your procedure, it is common to have soreness, bruising, or mild pain at the biopsy site. Blood tinged sputum.This should go away in a few days. Follow these instructions at home:  Rest as directed by your health care provider.  Take medicines only as directed by your health care provider.  There are many different ways to close and cover the biopsy site, including stitches (sutures), skin glue, and adhesive strips. Follow your health care provider's instructions about:  Biopsy site care.  Bandage (dressing) changes and removal.  Biopsy site closure removal.  Check your biopsy site every day for signs of infection. Watch for:  Redness, swelling, or pain.  Fluid, blood, or pus. Contact a health care provider if:  You have a fever.  You have redness, swelling, or pain at the biopsy site that lasts longer than a few days.  You have fluid, blood, or pus coming from the biopsy site.  You feel nauseous.  You vomit. Get help right away if:  You have shortness of breath.  You have trouble breathing.  You have chest pain.  You feel dizzy or you faint.  You have bleeding that does not stop with pressure or a bandage.  You cough up large amount of blood ( more than 1 tablespoon.)  You have pain in your abdomen. This information is not intended to replace advice given to you by your health care provider. Make sure you discuss any questions you have with your health care provider. Document Released:  04/17/2015 Document Revised: 05/08/2016 Document Reviewed: 11/27/2014  2017 Elsevier

## 2016-12-24 NOTE — OR Nursing (Signed)
When pt sleeps post procedure resumed nasal cannula 2 liters but removed when awake. Dr Kathlene Cote oked pt eating after PCXR. Pt denies complaints. Tolerated diet well

## 2016-12-24 NOTE — Procedures (Signed)
Interventional Radiology Procedure Note  Procedure:  CT guided biopsy of RUL lung nodule  Complications: Tiny right PTX  Estimated Blood Loss: < 10 mL  RUL nodule approached anteriorly.  One 62 G core biopsy sample obtained via 17 G needle.  Tiny adjacent PTX on post biopsy imaging.  Will follow with CXR during recovery.  Venetia Night. Kathlene Cote, M.D Pager:  [phone]

## 2016-12-25 ENCOUNTER — Ambulatory Visit: Payer: Medicare Other | Admitting: Radiation Oncology

## 2016-12-26 LAB — SURGICAL PATHOLOGY

## 2016-12-31 ENCOUNTER — Ambulatory Visit: Payer: Medicare Other | Admitting: Radiation Oncology

## 2017-01-06 ENCOUNTER — Encounter: Payer: Self-pay | Admitting: Radiation Oncology

## 2017-01-06 ENCOUNTER — Ambulatory Visit
Admission: RE | Admit: 2017-01-06 | Discharge: 2017-01-06 | Disposition: A | Discharge status: Home / Self Care | Payer: Medicare Other | Source: Ambulatory Visit | Attending: Radiation Oncology | Admitting: Radiation Oncology

## 2017-01-06 VITALS — BP 132/80 | HR 84 | Temp 96.9°F | Resp 18 | Wt 146.1 lb

## 2017-01-06 DIAGNOSIS — J449 Chronic obstructive pulmonary disease, unspecified: Secondary | ICD-10-CM | POA: Diagnosis not present

## 2017-01-06 DIAGNOSIS — C3411 Malignant neoplasm of upper lobe, right bronchus or lung: Secondary | ICD-10-CM | POA: Insufficient documentation

## 2017-01-06 DIAGNOSIS — Z87891 Personal history of nicotine dependence: Secondary | ICD-10-CM | POA: Diagnosis not present

## 2017-01-06 DIAGNOSIS — Z510 Encounter for antineoplastic radiation therapy: Secondary | ICD-10-CM | POA: Diagnosis present

## 2017-01-06 DIAGNOSIS — R918 Other nonspecific abnormal finding of lung field: Secondary | ICD-10-CM

## 2017-01-06 NOTE — Progress Notes (Signed)
Radiation Oncology Follow up Note  Name: Ryan Jarvis   Date:   01/06/2017 MRN:  [id number] DOB: 19-Jan-1951    This 66 y.o. male presents to the clinic today for follow-up for right lung stage I adenocarcinoma.  REFERRING PROVIDER: Adin Hector, MD  HPI: Patient is a 66 year old male with oxygen dependent at night COPD heavy smoker who was found to have a lesion in the right upper lobe which on PET scan was hypermetabolic.Marland Kitchen He was seen by surgeon not thought to be a surgical candidate based on his FEV1 and overall pulmonary status. We sent him for CT-guided biopsy which was positive for adenocarcinoma. He is seen today for consideration of SB RT treatment he is doing well. He specifically denies cough hemoptysis or chest tightness at this time.  COMPLICATIONS OF TREATMENT: none  FOLLOW UP COMPLIANCE: keeps appointments   PHYSICAL EXAM:  BP 132/80   Pulse 84   Temp (!) 96.9 F (36.1 C)   Resp 18   Wt 146 lb 0.9 oz (66.3 kg)   BMI 22.21 kg/m  Well-developed well-nourished patient in NAD. HEENT reveals PERLA, EOMI, discs not visualized.  Oral cavity is clear. No oral mucosal lesions are identified. Neck is clear without evidence of cervical or supraclavicular adenopathy. Lungs are clear to A&P. Cardiac examination is essentially unremarkable with regular rate and rhythm without murmur rub or thrill. Abdomen is benign with no organomegaly or masses noted. Motor sensory and DTR levels are equal and symmetric in the upper and lower extremities. Cranial nerves II through XII are grossly intact. Proprioception is intact. No peripheral adenopathy or edema is identified. No motor or sensory levels are noted. Crude visual fields are within normal range.  RADIOLOGY RESULTS: CT-guided biopsies reviewed compatible above-stated findings  PLAN: At this time I have recommended SB RT treatment. Will treat to 5000 cGy in 5 fractions. I personally set up and ordered CT simulation with motion  tracking for later this week. Risks and benefits of treatment leading possible development of cough fatigue possible rib pain all were discussed in detail with the patient and his wife. They both comprehend my treatment plan well.  I would like to take this opportunity to thank you for allowing me to participate in the care of your patient.Armstead Peaks., MD

## 2017-01-07 ENCOUNTER — Ambulatory Visit
Admission: RE | Admit: 2017-01-07 | Discharge: 2017-01-07 | Disposition: A | Discharge status: Home / Self Care | Payer: Medicare Other | Source: Ambulatory Visit | Attending: Radiation Oncology | Admitting: Radiation Oncology

## 2017-01-07 DIAGNOSIS — Z510 Encounter for antineoplastic radiation therapy: Secondary | ICD-10-CM | POA: Diagnosis not present

## 2017-01-11 ENCOUNTER — Emergency Department: Payer: Medicare Other

## 2017-01-11 ENCOUNTER — Inpatient Hospital Stay: Payer: Medicare Other

## 2017-01-11 ENCOUNTER — Inpatient Hospital Stay
Admission: EM | Admit: 2017-01-11 | Discharge: 2017-01-13 | DRG: 871 | Disposition: A | Discharge status: Home / Self Care | Payer: Medicare Other | Attending: Internal Medicine | Admitting: Internal Medicine

## 2017-01-11 DIAGNOSIS — Z882 Allergy status to sulfonamides status: Secondary | ICD-10-CM

## 2017-01-11 DIAGNOSIS — F419 Anxiety disorder, unspecified: Secondary | ICD-10-CM | POA: Diagnosis present

## 2017-01-11 DIAGNOSIS — A419 Sepsis, unspecified organism: Secondary | ICD-10-CM | POA: Diagnosis not present

## 2017-01-11 DIAGNOSIS — I10 Essential (primary) hypertension: Secondary | ICD-10-CM | POA: Diagnosis present

## 2017-01-11 DIAGNOSIS — K219 Gastro-esophageal reflux disease without esophagitis: Secondary | ICD-10-CM | POA: Diagnosis present

## 2017-01-11 DIAGNOSIS — R0602 Shortness of breath: Secondary | ICD-10-CM | POA: Diagnosis present

## 2017-01-11 DIAGNOSIS — F3341 Major depressive disorder, recurrent, in partial remission: Secondary | ICD-10-CM | POA: Diagnosis present

## 2017-01-11 DIAGNOSIS — J440 Chronic obstructive pulmonary disease with acute lower respiratory infection: Secondary | ICD-10-CM | POA: Diagnosis present

## 2017-01-11 DIAGNOSIS — Y95 Nosocomial condition: Secondary | ICD-10-CM | POA: Diagnosis present

## 2017-01-11 DIAGNOSIS — Z9861 Coronary angioplasty status: Secondary | ICD-10-CM

## 2017-01-11 DIAGNOSIS — J441 Chronic obstructive pulmonary disease with (acute) exacerbation: Secondary | ICD-10-CM | POA: Diagnosis present

## 2017-01-11 DIAGNOSIS — Z7951 Long term (current) use of inhaled steroids: Secondary | ICD-10-CM | POA: Diagnosis not present

## 2017-01-11 DIAGNOSIS — I2510 Atherosclerotic heart disease of native coronary artery without angina pectoris: Secondary | ICD-10-CM | POA: Diagnosis present

## 2017-01-11 DIAGNOSIS — Z8249 Family history of ischemic heart disease and other diseases of the circulatory system: Secondary | ICD-10-CM | POA: Diagnosis not present

## 2017-01-11 DIAGNOSIS — C3411 Malignant neoplasm of upper lobe, right bronchus or lung: Secondary | ICD-10-CM | POA: Diagnosis present

## 2017-01-11 DIAGNOSIS — Z87891 Personal history of nicotine dependence: Secondary | ICD-10-CM | POA: Diagnosis not present

## 2017-01-11 DIAGNOSIS — M1990 Unspecified osteoarthritis, unspecified site: Secondary | ICD-10-CM | POA: Diagnosis present

## 2017-01-11 DIAGNOSIS — J9601 Acute respiratory failure with hypoxia: Secondary | ICD-10-CM | POA: Diagnosis present

## 2017-01-11 DIAGNOSIS — Z888 Allergy status to other drugs, medicaments and biological substances status: Secondary | ICD-10-CM

## 2017-01-11 DIAGNOSIS — I252 Old myocardial infarction: Secondary | ICD-10-CM | POA: Diagnosis not present

## 2017-01-11 DIAGNOSIS — Z7902 Long term (current) use of antithrombotics/antiplatelets: Secondary | ICD-10-CM

## 2017-01-11 DIAGNOSIS — G250 Essential tremor: Secondary | ICD-10-CM | POA: Diagnosis present

## 2017-01-11 DIAGNOSIS — J189 Pneumonia, unspecified organism: Secondary | ICD-10-CM | POA: Diagnosis present

## 2017-01-11 LAB — CBC
HEMATOCRIT: 40.5 % (ref 40.0–52.0)
Hemoglobin: 14 g/dL (ref 13.0–18.0)
MCH: 32 pg (ref 26.0–34.0)
MCHC: 34.6 g/dL (ref 32.0–36.0)
MCV: 92.5 fL (ref 80.0–100.0)
Platelets: 296 10*3/uL (ref 150–440)
RBC: 4.37 MIL/uL — AB (ref 4.40–5.90)
RDW: 14.6 % — ABNORMAL HIGH (ref 11.5–14.5)
WBC: 12.5 10*3/uL — ABNORMAL HIGH (ref 3.8–10.6)

## 2017-01-11 LAB — LACTIC ACID, PLASMA: Lactic Acid, Venous: 1.9 mmol/L (ref 0.5–1.9)

## 2017-01-11 LAB — INFLUENZA PANEL BY PCR (TYPE A & B)
INFLAPCR: NEGATIVE
INFLBPCR: NEGATIVE

## 2017-01-11 LAB — BASIC METABOLIC PANEL
ANION GAP: 9 (ref 5–15)
BUN: 10 mg/dL (ref 6–20)
CHLORIDE: 90 mmol/L — AB (ref 101–111)
CO2: 32 mmol/L (ref 22–32)
Calcium: 8.8 mg/dL — ABNORMAL LOW (ref 8.9–10.3)
Creatinine, Ser: 0.8 mg/dL (ref 0.61–1.24)
GFR calc Af Amer: >60 mL/min (ref >60)
GFR calc non Af Amer: >60 mL/min (ref >60)
GLUCOSE: 112 mg/dL — AB (ref 65–99)
POTASSIUM: 4.2 mmol/L (ref 3.5–5.1)
Sodium: 131 mmol/L — ABNORMAL LOW (ref 135–145)

## 2017-01-11 LAB — BLOOD GAS, VENOUS
Acid-Base Excess: 7.1 mmol/L — ABNORMAL HIGH (ref 0.0–2.0)
BICARBONATE: 34.7 mmol/L — AB (ref 20.0–28.0)
PCO2 VEN: 60 mmHg (ref 44.0–60.0)
PH VEN: 7.37 (ref 7.250–7.430)
Patient temperature: 37

## 2017-01-11 LAB — PROCALCITONIN: Procalcitonin: <0.1 ng/mL

## 2017-01-11 LAB — TROPONIN I: Troponin I: <0.03 ng/mL (ref <0.03)

## 2017-01-11 LAB — BRAIN NATRIURETIC PEPTIDE: B Natriuretic Peptide: 45 pg/mL (ref 0.0–100.0)

## 2017-01-11 MED ORDER — BUDESONIDE 0.25 MG/2ML IN SUSP
0.2500 mg | Freq: Two times a day (BID) | RESPIRATORY_TRACT | Status: DC
  Administered 2017-01-12: 07:00:00 0.25 mg via RESPIRATORY_TRACT
  Filled 2017-01-11: qty 2

## 2017-01-11 MED ORDER — METHYLPREDNISOLONE SODIUM SUCC 125 MG IJ SOLR
60.0000 mg | Freq: Four times a day (QID) | INTRAMUSCULAR | Status: DC
  Administered 2017-01-12 – 2017-01-13 (×8): 60 mg via INTRAVENOUS
  Filled 2017-01-11 (×8): qty 2

## 2017-01-11 MED ORDER — ONDANSETRON HCL 4 MG/2ML IJ SOLN: 4.0000 mg | Freq: Four times a day (QID) | INTRAMUSCULAR | Status: DC | PRN

## 2017-01-11 MED ORDER — MORPHINE SULFATE (PF) 2 MG/ML IV SOLN: 2.0000 mg | Freq: Once | INTRAVENOUS | Status: DC

## 2017-01-11 MED ORDER — PANTOPRAZOLE SODIUM 40 MG IV SOLR
40.0000 mg | Freq: Every day | INTRAVENOUS | Status: DC
  Administered 2017-01-12: 01:00:00 40 mg via INTRAVENOUS
  Filled 2017-01-11: qty 40

## 2017-01-11 MED ORDER — ACETAMINOPHEN 325 MG PO TABS: 650.0000 mg | ORAL_TABLET | ORAL | Status: DC | PRN

## 2017-01-11 MED ORDER — MORPHINE SULFATE (PF) 2 MG/ML IV SOLN: 2.0000 mg | INTRAVENOUS | Status: DC | PRN

## 2017-01-11 MED ORDER — DEXTROSE 5 % IV SOLN
500.0000 mg | INTRAVENOUS | Status: DC
  Administered 2017-01-12 – 2017-01-13 (×2): 500 mg via INTRAVENOUS
  Filled 2017-01-11 (×3): qty 500

## 2017-01-11 MED ORDER — IPRATROPIUM-ALBUTEROL 0.5-2.5 (3) MG/3ML IN SOLN
3.0000 mL | Freq: Once | RESPIRATORY_TRACT | Status: AC
  Administered 2017-01-11: 3 mL via RESPIRATORY_TRACT
  Filled 2017-01-11: qty 3

## 2017-01-11 MED ORDER — IPRATROPIUM-ALBUTEROL 0.5-2.5 (3) MG/3ML IN SOLN
3.0000 mL | Freq: Four times a day (QID) | RESPIRATORY_TRACT | Status: DC
  Administered 2017-01-11 – 2017-01-13 (×7): 3 mL via RESPIRATORY_TRACT
  Filled 2017-01-11 (×8): qty 3

## 2017-01-11 MED ORDER — MORPHINE SULFATE (PF) 2 MG/ML IV SOLN
2.0000 mg | INTRAVENOUS | Status: DC | PRN
  Administered 2017-01-11: 4 mg via INTRAVENOUS
  Filled 2017-01-11: qty 2

## 2017-01-11 MED ORDER — CEFEPIME-DEXTROSE 2 GM/50ML IV SOLR
2.0000 g | INTRAVENOUS | Status: AC
  Administered 2017-01-11: 2 g via INTRAVENOUS
  Filled 2017-01-11: qty 50

## 2017-01-11 MED ORDER — SODIUM CHLORIDE 0.9 % IV SOLN: 250.0000 mL | INTRAVENOUS | Status: DC | PRN

## 2017-01-11 MED ORDER — ALBUTEROL SULFATE (2.5 MG/3ML) 0.083% IN NEBU
2.5000 mg | INHALATION_SOLUTION | RESPIRATORY_TRACT | Status: AC
  Administered 2017-01-11: 2.5 mg via RESPIRATORY_TRACT
  Filled 2017-01-11: qty 3

## 2017-01-11 MED ORDER — MORPHINE SULFATE (PF) 4 MG/ML IV SOLN
4.0000 mg | Freq: Once | INTRAVENOUS | Status: AC
  Administered 2017-01-11: 4 mg via INTRAVENOUS
  Filled 2017-01-11: qty 1

## 2017-01-11 MED ORDER — HEPARIN SODIUM (PORCINE) 5000 UNIT/ML IJ SOLN
5000.0000 [IU] | Freq: Three times a day (TID) | INTRAMUSCULAR | Status: DC
  Administered 2017-01-12 (×2): 5000 [IU] via SUBCUTANEOUS
  Filled 2017-01-11 (×2): qty 1

## 2017-01-11 MED ORDER — SODIUM CHLORIDE 0.9 % IV BOLUS (SEPSIS)
1000.0000 mL | Freq: Once | INTRAVENOUS | Status: AC
  Administered 2017-01-11: 1000 mL via INTRAVENOUS

## 2017-01-11 MED ORDER — VANCOMYCIN HCL IN DEXTROSE 1-5 GM/200ML-% IV SOLN
1000.0000 mg | Freq: Once | INTRAVENOUS | Status: AC
  Administered 2017-01-11: 1000 mg via INTRAVENOUS
  Filled 2017-01-11: qty 200

## 2017-01-11 NOTE — Consult Note (Signed)
PULMONARY / CRITICAL CARE MEDICINE   Name: Ryan Jarvis MRN: [id number] DOB: Mar 23, 1951    ADMISSION DATE:  01/11/2017    Pt seen and examined, agree with NP findings, assessment, plan.   The patient is a 66 yo male with recently diagnosed lung ca, he is undergoing SBRT due to severe COPD, not a candidate for resection.  Currently presents with AECOPD symptoms, will continue steroids, abx, lung show scattered wheeze on auscultation, but patient notes that he is feeling much better than on admission.  Can likely DC next 1-2 from respiratory standpoint. Pt is noted to be a patient of Dr. Raul Del, but not sure if he wants to follow up with him. He was given our card for follow up if he wishes, as he is advised to see a pulmonologist after discharge.  Will sign off for now, please call if there are any questions or concerns.   -Marda Stalker, M.D. 01/12/2017   CONSULTATION DATE: 01/11/2017  REFERRING MD:    Dr. Jacqualine Code   CHIEF COMPLAINT:  SOB  HISTORY OF PRESENT ILLNESS:   This is a 66 year old Caucasian male with a past medical history of anxiety, arthritis, stage Ia lung cancer(RUL mass), COPD, emphysema, GERD, hypertension, myocardial infarction and tremors presents with worsening shortness of breath that started today. Patient has a history of lung cancer and is currently being seen and treated by oncology. Patient states that at about 12 noon, he started having acute shortness of breath, hence he called EMS. When EMS arrived, patient was hypoxic and hence he was placed on BiPAP. Shortness of breath has improved on BiPAP and he is now on 2 L nasal cannula. He reports significant improvement in chest tightness. He denies dizziness, nausea and vomiting but reports a mild nonproductive cough. His oxygen saturation is currently 95-97%. Patient uses oxygen at home. He denies any sick contacts. His chest x-ray shows a new right lower lobe  Infiltrate.   PAST MEDICAL HISTORY :  He  has a  past medical history of Anxiety; Arthritis; Cancer [facility]); COPD (chronic obstructive pulmonary disease) ([place]); Dyspnea; Emphysema of lung ([place]); GERD (gastroesophageal reflux disease); Hypertension; Myocardial infarction; and Tremors of nervous system.  PAST SURGICAL HISTORY: He  has a past surgical history that includes Back surgery; Stent removal; and Coronary angioplasty.  Allergies  Allergen Reactions  . Zolpidem Other (See Comments)    agitation  . Belladonna Alkaloids   . Sulfa Antibiotics     No current facility-administered medications on file prior to encounter.    Current Outpatient Prescriptions on File Prior to Encounter  Medication Sig  . albuterol (PROVENTIL HFA;VENTOLIN HFA) 108 (90 Base) MCG/ACT inhaler Inhale into the lungs.  Marland Kitchen atorvastatin (LIPITOR) 40 MG tablet Take by mouth.  . Cholecalciferol (VITAMIN D-1000 MAX ST) 1000 units tablet Take 1 tablet by mouth 1 day or 1 dose.  . clopidogrel (PLAVIX) 75 MG tablet Take 75 mg by mouth daily.   . DULoxetine (CYMBALTA) 30 MG capsule Take 1 capsule by mouth daily.   . Fluticasone-Salmeterol (ADVAIR DISKUS) 500-50 MCG/DOSE AEPB INHALE ONE DOSE BY MOUTH EVERY 12 HOURS  . furosemide (LASIX) 80 MG tablet TAKE ONE TABLET BY MOUTH ONCE DAILY  . gabapentin (NEURONTIN) 300 MG capsule Take 600 mg by mouth at bedtime.   Marland Kitchen HYDROcodone-acetaminophen (NORCO) 7.5-325 MG tablet Take 1 tablet by mouth every 6 (six) hours as needed.  . metolazone (ZAROXOLYN) 5 MG tablet Take 1 tablet by mouth as needed.   Marland Kitchen  metoprolol succinate (TOPROL-XL) 100 MG 24 hr tablet TAKE ONE TABLET BY MOUTH ONCE DAILY  . pantoprazole (PROTONIX) 40 MG tablet Take 40 mg by mouth 2 (two) times daily.   . potassium chloride SA (K-DUR,KLOR-CON) 20 MEQ tablet Take 1 tablet by mouth 1 day or 1 dose.  . primidone (MYSOLINE) 250 MG tablet TAKE 1.5 TABLET BY twice daily  . sucralfate (CARAFATE) 1 g tablet Take 1 g by mouth 2 (two) times daily.   . tamsulosin (FLOMAX) 0.4 MG  CAPS capsule TAKE ONE CAPSULE BY MOUTH ONCE DAILY 30 MINUTES AFTER THE SAME MEAL EACH DAY  . theophylline (UNIPHYL) 400 MG 24 hr tablet Take 1 tablet by mouth daily.   Marland Kitchen tiotropium (SPIRIVA HANDIHALER) 18 MCG inhalation capsule INHALE ONE DOSE BY MOUTH ONCE DAILY    FAMILY HISTORY:  His indicated that his mother is deceased. He indicated that his father is deceased.    SOCIAL HISTORY: He  reports that he has quit smoking. His smoking use included Cigarettes. He has a 49.00 pack-year smoking history. He has never used smokeless tobacco. He reports that he does not drink alcohol or use drugs.  REVIEW OF SYSTEMS:   Constitutional: Negative for fever and chills but positive for generalized mailaise  HENT: Negative for congestion and rhinorrhea.  Eyes: Negative for redness and visual disturbance.  Respiratory: positive for shortness of breath and wheezing.  Cardiovascular: Negative for chest pain and palpitations.  Gastrointestinal: Negative  for nausea , vomiting and abdominal pain and  Loose stools but positive for anorexia Genitourinary: Negative for dysuria and urgency.  Endocrine: Denies polyuria, polyphagia and heat intolerance Musculoskeletal: Negative for myalgias and arthralgias.  Skin: Negative for pallor and wound.  Neurological: Negative for dizziness and headaches   SUBJECTIVE:    VITAL SIGNS: BP 109/76 (BP Location: Right Arm)   Pulse 96   Temp 100 F (37.8 C) (Oral)   Resp 14   Ht '5\' 8"'$  (1.727 m)   Wt 66.7 kg (147 lb)   SpO2 96%   BMI 22.35 kg/m   HEMODYNAMICS:    VENTILATOR SETTINGS: FiO2 (%):  [40 %] 40 %  INTAKE / OUTPUT: I/O last 3 completed shifts: In: 1050 [I.V.:1050] Out: -   PHYSICAL EXAMINATION: General:  Chronically ill-looking, in no respiratory distress Neuro:  Alert to person, place and time, cranial nerves grossly intact, no focal deficits HEENT:  [place]/AT, PERRLA, mucosa moist, trachea midline Cardiovascular: Apical pulse regular, S1, S2  audible, no murmur, regurg or gallop Lungs: Normal work of breathing, severe dyspnea with minimal exertion, bilateral breath sounds, severe, diminished in the right, and both bases, cows, rhonchi in anterior lung fields Abdomen: Distended, normal bowel sounds in all 4 quadrants, soft, without organomegaly Musculoskeletal: Positive range of motion in upper and lower extremities Extremities: +2 pulses bilaterally, +2 edema Skin:  Warm and dry  LABS:  BMET  Recent Labs Lab 01/11/17 1435  NA 131*  K 4.2  CL 90*  CO2 32  BUN 10  CREATININE 0.80  GLUCOSE 112*    Electrolytes  Recent Labs Lab 01/11/17 1435  CALCIUM 8.8*    CBC  Recent Labs Lab 01/11/17 1435  WBC 12.5*  HGB 14.0  HCT 40.5  PLT 296    Coag's No results for input(s): APTT, INR in the last 168 hours.  Sepsis Markers  Recent Labs Lab 01/11/17 1435 01/11/17 1438  LATICACIDVEN  --  1.9  PROCALCITON <0.10  --     ABG No  results for input(s): PHART, PCO2ART, PO2ART in the last 168 hours.  Liver Enzymes No results for input(s): AST, ALT, ALKPHOS, BILITOT, ALBUMIN in the last 168 hours.  Cardiac Enzymes  Recent Labs Lab 01/11/17 1435  TROPONINI <0.03    Glucose No results for input(s): GLUCAP in the last 168 hours.  Imaging Dg Chest Portable 1 View  Result Date: 01/11/2017 CLINICAL DATA:  Acute shortness of breath.  History of lung cancer. EXAM: PORTABLE CHEST 1 VIEW COMPARISON:  12/24/2016 and prior exams FINDINGS: The cardiomediastinal silhouette is unremarkable. Right upper lobe nodular density is unchanged from most recent study. Right lower lobe interstitial opacities are now noted and nonspecific but may represent focal edema, inflammation or infection. There is no evidence of pleural effusion, pneumothorax or consolidation. IMPRESSION: New nonspecific right lower lobe interstitial opacities which may represent infection, focal edema or inflammation. Unchanged right upper lobe nodule.  Electronically Signed   By: Margarette Canada M.D.   On: 01/11/2017 15:18    STUDIES:  None  CULTURES: Blood cultures 2. MRSA screen negative  ANTIBIOTICS: Azithromycin Cefepime  SIGNIFICANT EVENTS: 01/03/2017: ED with dyspnea, admitted with acute on chronic COPD exacerbation  LINES/TUBES: Peripheral IVs  DISCUSSION: This is 66 year old Caucasian male with a history of lung cancer presenting with acute hypoxemic respiratory failure, and acute COPD exacerbation and community-acquired pneumonia   ASSESSMENT   HCAP Acute hypoxemic respiratory failure Acute COPD exacerbation Healthcare acquired pneumonia Stage 1a lung cancer.   PLAN   Antibiotics as above. Follow-up blood cultures Nebulized bronchodilators. Systemic and inhale steroids Daily chest x-ray when necessary Continue follow-up with oncologist regarding treatment for lung cancer. Influenza immunization Morphine when necessary for dyspnea GI and DVT prophylaxis with Protonix and subcutaneous heparin respectively Rest of the treatment plan Per primary team   FAMILY  - Updates: Patient updated on current plan of care  - Inter-disciplinary family meet or Palliative Care meeting due by:  day 7  Plan of care discussed with with Dr. Mortimer Fries and Margaree Mackintosh physician  Total critical care time equals 45 minutes  Magdalene [facility]. [facility] ANP-BC Pulmonary and Critical Care Medicine [facility] Pager [phone] or [phone] 01/11/2017, 9:27 PM

## 2017-01-11 NOTE — ED Notes (Signed)
This RN to bedside at this time to apologize for delay in patient care. Pt states understanding, states, "I think I would feel better if I took this mask off". Pt's family states that MD was supposed to come evaluate whether or not BiPAP could be removed. This RN apologized, explained would speak with MD at this time.

## 2017-01-11 NOTE — ED Provider Notes (Signed)
[facility] Emergency Department Provider Note   ____________________________________________   First MD Initiated Contact with Patient 01/11/17 1429     (approximate)  I have reviewed the triage vital signs and the nursing notes.   HISTORY  Chief Complaint Shortness of Breath  EM caveat: Patient in respiratory distress unable to speak beyond one word at a time, too dyspneic to provide a history  HPI Ryan Jarvis is a 66 y.o. male EMS reports the patient was recently diagnosed as "pneumonia" and possibly having lung mass or cancer. He also has a history of COPD. There called for shortness of breath and he is found to be wheezing, extremely short of breath and hypoxic with saturations in the mid 70s. He was given 125 mg of Solu-Medrol, nebulizer treatment, and placed on CPAP and is showing signs of improvement at this time. There is some question as to his CODE STATUS, but the patient's family did not have a "DO NOT RESUSCITATE" paper available.   Past Medical History:  Diagnosis Date  . Anxiety   . Arthritis   . Cancer ([place])   . COPD (chronic obstructive pulmonary disease) ([place])   . Dyspnea   . Emphysema of lung ([place])   . GERD (gastroesophageal reflux disease)   . Hypertension   . Myocardial infarction   . Tremors of nervous system     Patient Active Problem List   Diagnosis Date Noted  . Acute exacerbation of chronic obstructive pulmonary disease (COPD) ([place]) 01/11/2017  . CAP (community acquired pneumonia) 01/11/2017  . Anemia 11/25/2016  . COPD (chronic obstructive pulmonary disease) ([place]) 11/25/2016  . GERD (gastroesophageal reflux disease) 11/25/2016  . Vitamin B12 deficiency 09/17/2016  . Pulmonary nodules 07/08/2016  . Abnormal CT of the chest 07/08/2016  . Hemoptysis 07/08/2016  . Pneumonia 07/08/2016  . Senile purpura ([place]) 03/19/2016  . Recurrent major depressive disorder, in partial remission ([place]) 01/09/2016  . Peripheral  polyneuropathy ([place]) 02/06/2015  . Benign essential tremor 07/04/2014  . CAD (coronary artery disease) 04/27/2012  . Dyslipidemia 04/27/2012  . HTN (hypertension) 04/27/2012  . PUD (peptic ulcer disease) 04/27/2012  . Spinal stenosis 04/27/2012    Past Surgical History:  Procedure Laterality Date  . BACK SURGERY    . CORONARY ANGIOPLASTY    . STENT REMOVAL      Prior to Admission medications   Medication Sig Start Date End Date Taking? Authorizing Provider  albuterol (PROVENTIL HFA;VENTOLIN HFA) 108 (90 Base) MCG/ACT inhaler Inhale into the lungs. 03/19/16 03/19/17 Yes Historical Provider, MD  atorvastatin (LIPITOR) 40 MG tablet Take by mouth. 02/13/16 02/12/17 Yes Historical Provider, MD  benzonatate (TESSALON) 100 MG capsule Take 200 mg by mouth 3 (three) times daily as needed for cough.   Yes Historical Provider, MD  Cholecalciferol (VITAMIN D-1000 MAX ST) 1000 units tablet Take 1 tablet by mouth 1 day or 1 dose.   Yes Historical Provider, MD  clopidogrel (PLAVIX) 75 MG tablet Take 75 mg by mouth daily.  12/31/15  Yes Historical Provider, MD  diphenhydramine-acetaminophen (TYLENOL PM) 25-500 MG TABS tablet Take 1 tablet by mouth at bedtime as needed.   Yes Historical Provider, MD  DULoxetine (CYMBALTA) 30 MG capsule Take 1 capsule by mouth daily.  09/11/16  Yes Historical Provider, MD  Fluticasone-Salmeterol (ADVAIR DISKUS) 500-50 MCG/DOSE AEPB INHALE ONE DOSE BY MOUTH EVERY 12 HOURS 01/28/16  Yes Historical Provider, MD  furosemide (LASIX) 80 MG tablet TAKE ONE TABLET BY MOUTH ONCE DAILY 06/12/16  Yes Historical Provider, MD  gabapentin (NEURONTIN) 300 MG capsule Take 600 mg by mouth at bedtime.  04/28/16 04/28/17 Yes Historical Provider, MD  HYDROcodone-acetaminophen (NORCO) 7.5-325 MG tablet Take 1 tablet by mouth every 6 (six) hours as needed. 12/15/16  Yes Historical Provider, MD  metolazone (ZAROXOLYN) 5 MG tablet Take 1 tablet by mouth as needed.  09/11/16  Yes Historical Provider, MD    metoprolol succinate (TOPROL-XL) 100 MG 24 hr tablet TAKE ONE TABLET BY MOUTH ONCE DAILY 06/12/16  Yes Historical Provider, MD  pantoprazole (PROTONIX) 40 MG tablet Take 40 mg by mouth 2 (two) times daily.  12/31/15  Yes Historical Provider, MD  potassium chloride SA (K-DUR,KLOR-CON) 20 MEQ tablet Take 1 tablet by mouth 1 day or 1 dose. 09/11/16  Yes Historical Provider, MD  primidone (MYSOLINE) 250 MG tablet TAKE 1.5 TABLET BY twice daily 04/17/16  Yes Historical Provider, MD  QUEtiapine (SEROQUEL) 25 MG tablet Take 25 mg by mouth at bedtime.   Yes Historical Provider, MD  sucralfate (CARAFATE) 1 g tablet Take 1 g by mouth 2 (two) times daily.  12/31/15  Yes Historical Provider, MD  tamsulosin (FLOMAX) 0.4 MG CAPS capsule TAKE ONE CAPSULE BY MOUTH ONCE DAILY 30 MINUTES AFTER THE SAME MEAL EACH DAY 06/12/16  Yes Historical Provider, MD  theophylline (UNIPHYL) 400 MG 24 hr tablet Take 1 tablet by mouth daily.  09/11/16  Yes Historical Provider, MD  tiotropium (SPIRIVA HANDIHALER) 18 MCG inhalation capsule INHALE ONE DOSE BY MOUTH ONCE DAILY 11/05/15  Yes Historical Provider, MD  vitamin B-12 (CYANOCOBALAMIN) 1000 MCG tablet Take 1,000 mcg by mouth daily.   Yes Historical Provider, MD    Allergies Zolpidem; Belladonna alkaloids; and Sulfa antibiotics  Family History  Problem Relation Age of Onset  . Congestive Heart Failure Mother   . Congestive Heart Failure Father     Social History Social History  Substance Use Topics  . Smoking status: Former Smoker    Packs/day: 1.00    Years: 49.00    Types: Cigarettes  . Smokeless tobacco: Never Used     Comment: quit smoking in 2013  . Alcohol use No    Review of Systems  EM caveat ____________________________________________   PHYSICAL EXAM:  VITAL SIGNS: ED Triage Vitals  Enc Vitals Group     BP      Pulse      Resp      Temp      Temp src      SpO2      Weight      Height      Head Circumference      Peak Flow      Pain Score       Pain Loc      Pain Edu?      Excl. in [place]?     Constitutional: Alert and Sitting upright, appears moderately dyspneic with CPAP mask in place. No somnolence. No lethargy. Tolerating CPAP very well. Eyes: Conjunctivae are normal. PERRL. EOMI. Head: Atraumatic. Nose: No congestion/rhinnorhea. Mouth/Throat: Mucous membranes are moist.  Oropharynx non-erythematous. Neck: No stridor.   Cardiovascular: Tachycardic rate, regular rhythm. Grossly normal heart sounds.  Good peripheral circulation. Respiratory: Moderate increased work of breathing, moderate use of accessory muscles, and expiratory wheezing heard throughout, questionably somewhat diminished on the left lower versus the right. Gastrointestinal: Soft and nontender. No distention.  Musculoskeletal: No lower extremity tenderness.  Neurologic:  Normal speech and language. No gross focal neurologic deficits are appreciated.  Able to move all extremities to command. Skin:  Skin is cool and mottled peripherally. No rash noted. Psychiatric: Mood and affect are anxious.  ____________________________________________   LABS (all labs ordered are listed, but only abnormal results are displayed)  Labs Reviewed  BLOOD GAS, VENOUS - Abnormal; Notable for the following:       Result Value   Bicarbonate 34.7 (*)    Acid-Base Excess 7.1 (*)    All other components within normal limits  CBC - Abnormal; Notable for the following:    WBC 12.5 (*)    RBC 4.37 (*)    RDW 14.6 (*)    All other components within normal limits  BASIC METABOLIC PANEL - Abnormal; Notable for the following:    Sodium 131 (*)    Chloride 90 (*)    Glucose, Bld 112 (*)    Calcium 8.8 (*)    All other components within normal limits  CULTURE, BLOOD (ROUTINE X 2)  CULTURE, BLOOD (ROUTINE X 2)  LACTIC ACID, PLASMA  TROPONIN I  BRAIN NATRIURETIC PEPTIDE  INFLUENZA PANEL BY PCR (TYPE A & B)  PROCALCITONIN    ____________________________________________  EKG  Reviewed and interpreted by me at 1430 Heart rate 1:15 QRS 90 QTc 440 Sinus tachycardia without associated ischemic changes noted, consider left atrial enlargement ____________________________________________  RADIOLOGY  Dg Chest Portable 1 View  Result Date: 01/11/2017 CLINICAL DATA:  Acute shortness of breath.  History of lung cancer. EXAM: PORTABLE CHEST 1 VIEW COMPARISON:  12/24/2016 and prior exams FINDINGS: The cardiomediastinal silhouette is unremarkable. Right upper lobe nodular density is unchanged from most recent study. Right lower lobe interstitial opacities are now noted and nonspecific but may represent focal edema, inflammation or infection. There is no evidence of pleural effusion, pneumothorax or consolidation. IMPRESSION: New nonspecific right lower lobe interstitial opacities which may represent infection, focal edema or inflammation. Unchanged right upper lobe nodule. Electronically Signed   By: Margarette Canada M.D.   On: 01/11/2017 15:18    ____________________________________________   PROCEDURES  Procedure(s) performed: None  Procedures  Critical Care performed: Yes, see critical care note(s)  CRITICAL CARE Performed by: Delman Kitten   Total critical care time: 40 minutes  Critical care time was exclusive of separately billable procedures and treating other patients.  Critical care was necessary to treat or prevent imminent or life-threatening deterioration.  Critical care was time spent personally by me on the following activities: development of treatment plan with patient and/or surrogate as well as nursing, discussions with consultants, evaluation of patient's response to treatment, examination of patient, obtaining history from patient or surrogate, ordering and performing treatments and interventions, ordering and review of laboratory studies, ordering and review of radiographic studies, pulse  oximetry and re-evaluation of patient's condition.  ____________________________________________   INITIAL IMPRESSION / ASSESSMENT AND PLAN / ED COURSE  Pertinent labs & imaging results that were available during my care of the patient were reviewed by me and considered in my medical decision making (see chart for details).  Patient presents with severe dyspnea. Differential diagnoses broad, he does carry a history of COPD and evidently a recent lung biopsy for possible mass. On exam the patient has moderate increased work of breathing, but does appear to have positive improvement at this point compared to when he was first evaluated with EMS and found to be quite hypoxic with improvement on CPAP tolerating it well at this time.  Aggressive treatment for COPD, evaluate for possible pneumonia, influenza, acute  cardiac etiologies etc. all considered.   ----------------------------------------- 2:35 PM on 01/11/2017 -----------------------------------------  Called and spoke with the patient's wife, Barnetta Chapel, who reports that she would want "everything possible" done to save her maintain her husband's life", and I clarified this is including being on a ventilator and having CPR performed. The patient will be maintained "full code"  ----------------------------------------- 2:36 PM on 01/11/2017 -----------------------------------------    Clinical Course as of Jan 11 2146  Nancy Fetter Jan 11, 2017  1605 Patient improving. Now able to speak over the BiPAP mask, continues to sound tight by pulmonary exam with wheezing bilateral. Receiving nebulizers at present. Patient and family updated, reporting he's been having right-sided chest pain since time of his biopsy and have ordered morphine for this. Patient agreeable with plan for admission, ICU physician paged for admission.  [MQ]  1931 Patient off BiPAP, reports feeling improved. He is awake and alert, respiratory comfortably at this time a nasal  cannula with normal oxygen level. Patient reports moderate improvement in pain and right-sided chest, I'll give a small additional dose of morphine for comfort.  [MQ]    Clinical Course User Index [MQ] Delman Kitten, MD     ____________________________________________   FINAL CLINICAL IMPRESSION(S) / ED DIAGNOSES  Final diagnoses:  COPD exacerbation ([place])  Sepsis, due to unspecified organism [facility])  Healthcare-associated pneumonia      NEW MEDICATIONS STARTED DURING THIS VISIT:  New Prescriptions   No medications on file     Note:  This document was prepared using Dragon voice recognition software and may include unintentional dictation errors.     Delman Kitten, MD 01/11/17 2147

## 2017-01-11 NOTE — H&P (Signed)
[facility] [place] NAME: Ryan Jarvis    MR#:  [id number]  DATE OF BIRTH:  10/30/1951  DATE OF ADMISSION:  01/11/2017  PRIMARY CARE PHYSICIAN: Adin Hector, MD   REQUESTING/REFERRING PHYSICIAN: Jacqualine Code, MD  CHIEF COMPLAINT:   Chief Complaint  Patient presents with  . Shortness of Breath    HISTORY OF PRESENT ILLNESS:  Ryan Jarvis  is a 66 y.o. male who presents with Acute episode of significant shortness of breath today. In the ED tonight he required BiPAP, but was able to be weaned off of it to nasal cannula. Workup shows possible pneumonia, with COPD exacerbation as well. Patient recently had a lung biopsy, and states that he feels like his lung biopsy site was where he was having the most trouble breathing. Hospitalists were called for admission  PAST MEDICAL HISTORY:   Past Medical History:  Diagnosis Date  . Anxiety   . Arthritis   . Cancer ([place])   . COPD (chronic obstructive pulmonary disease) ([place])   . Dyspnea   . Emphysema of lung ([place])   . GERD (gastroesophageal reflux disease)   . Hypertension   . Myocardial infarction   . Tremors of nervous system     PAST SURGICAL HISTORY:   Past Surgical History:  Procedure Laterality Date  . BACK SURGERY    . CORONARY ANGIOPLASTY    . STENT REMOVAL      SOCIAL HISTORY:   Social History  Substance Use Topics  . Smoking status: Former Smoker    Packs/day: 1.00    Years: 49.00    Types: Cigarettes  . Smokeless tobacco: Never Used     Comment: quit smoking in 2013  . Alcohol use No    FAMILY HISTORY:   Family History  Problem Relation Age of Onset  . Congestive Heart Failure Mother   . Congestive Heart Failure Father     DRUG ALLERGIES:   Allergies  Allergen Reactions  . Zolpidem Other (See Comments)    agitation  . Belladonna Alkaloids   . Sulfa Antibiotics     MEDICATIONS AT HOME:   Prior to Admission medications   Medication Sig Start  Date End Date Taking? Authorizing Provider  albuterol (PROVENTIL HFA;VENTOLIN HFA) 108 (90 Base) MCG/ACT inhaler Inhale into the lungs. 03/19/16 03/19/17 Yes Historical Provider, MD  atorvastatin (LIPITOR) 40 MG tablet Take by mouth. 02/13/16 02/12/17 Yes Historical Provider, MD  benzonatate (TESSALON) 100 MG capsule Take 200 mg by mouth 3 (three) times daily as needed for cough.   Yes Historical Provider, MD  Cholecalciferol (VITAMIN D-1000 MAX ST) 1000 units tablet Take 1 tablet by mouth 1 day or 1 dose.   Yes Historical Provider, MD  clopidogrel (PLAVIX) 75 MG tablet Take 75 mg by mouth daily.  12/31/15  Yes Historical Provider, MD  diphenhydramine-acetaminophen (TYLENOL PM) 25-500 MG TABS tablet Take 1 tablet by mouth at bedtime as needed.   Yes Historical Provider, MD  DULoxetine (CYMBALTA) 30 MG capsule Take 1 capsule by mouth daily.  09/11/16  Yes Historical Provider, MD  Fluticasone-Salmeterol (ADVAIR DISKUS) 500-50 MCG/DOSE AEPB INHALE ONE DOSE BY MOUTH EVERY 12 HOURS 01/28/16  Yes Historical Provider, MD  furosemide (LASIX) 80 MG tablet TAKE ONE TABLET BY MOUTH ONCE DAILY 06/12/16  Yes Historical Provider, MD  gabapentin (NEURONTIN) 300 MG capsule Take 600 mg by mouth at bedtime.  04/28/16 04/28/17 Yes Historical Provider, MD  HYDROcodone-acetaminophen ([place]) 7.5-325 MG tablet  Take 1 tablet by mouth every 6 (six) hours as needed. 12/15/16  Yes Historical Provider, MD  metolazone (ZAROXOLYN) 5 MG tablet Take 1 tablet by mouth as needed.  09/11/16  Yes Historical Provider, MD  metoprolol succinate (TOPROL-XL) 100 MG 24 hr tablet TAKE ONE TABLET BY MOUTH ONCE DAILY 06/12/16  Yes Historical Provider, MD  pantoprazole (PROTONIX) 40 MG tablet Take 40 mg by mouth 2 (two) times daily.  12/31/15  Yes Historical Provider, MD  potassium chloride SA (K-DUR,KLOR-CON) 20 MEQ tablet Take 1 tablet by mouth 1 day or 1 dose. 09/11/16  Yes Historical Provider, MD  primidone (MYSOLINE) 250 MG tablet TAKE 1.5 TABLET BY twice  daily 04/17/16  Yes Historical Provider, MD  QUEtiapine (SEROQUEL) 25 MG tablet Take 25 mg by mouth at bedtime.   Yes Historical Provider, MD  sucralfate (CARAFATE) 1 g tablet Take 1 g by mouth 2 (two) times daily.  12/31/15  Yes Historical Provider, MD  tamsulosin (FLOMAX) 0.4 MG CAPS capsule TAKE ONE CAPSULE BY MOUTH ONCE DAILY 30 MINUTES AFTER THE SAME MEAL EACH DAY 06/12/16  Yes Historical Provider, MD  theophylline (UNIPHYL) 400 MG 24 hr tablet Take 1 tablet by mouth daily.  09/11/16  Yes Historical Provider, MD  tiotropium (SPIRIVA HANDIHALER) 18 MCG inhalation capsule INHALE ONE DOSE BY MOUTH ONCE DAILY 11/05/15  Yes Historical Provider, MD  vitamin B-12 (CYANOCOBALAMIN) 1000 MCG tablet Take 1,000 mcg by mouth daily.   Yes Historical Provider, MD    REVIEW OF SYSTEMS:  Review of Systems  Constitutional: Negative for chills, fever, malaise/fatigue and weight loss.  HENT: Negative for ear pain, hearing loss and tinnitus.   Eyes: Negative for blurred vision, double vision, pain and redness.  Respiratory: Positive for cough, shortness of breath and wheezing. Negative for hemoptysis.   Cardiovascular: Negative for chest pain, palpitations, orthopnea and leg swelling.  Gastrointestinal: Negative for abdominal pain, constipation, diarrhea, nausea and vomiting.  Genitourinary: Negative for dysuria, frequency and hematuria.  Musculoskeletal: Negative for back pain, joint pain and neck pain.  Skin:       No acne, rash, or lesions  Neurological: Negative for dizziness, tremors, focal weakness and weakness.  Endo/Heme/Allergies: Negative for polydipsia. Does not bruise/bleed easily.  Psychiatric/Behavioral: Negative for depression. The patient is not nervous/anxious and does not have insomnia.      VITAL SIGNS:   Vitals:   01/11/17 1830 01/11/17 1900 01/11/17 2000 01/11/17 2100  BP: 130/78 115/78 110/79 109/76  Pulse: (!) 103 (!) 105 97 96  Resp: '11 10 12 14  '$ Temp:      TempSrc:      SpO2:  97% 92% 96% 96%  Weight:      Height:       Wt Readings from Last 3 Encounters:  01/11/17 66.7 kg (147 lb)  01/06/17 66.3 kg (146 lb 0.9 oz)  12/10/16 66.5 kg (146 lb 9.7 oz)    PHYSICAL EXAMINATION:  Physical Exam  Vitals reviewed. Constitutional: He is oriented to person, place, and time. He appears well-developed and well-nourished. No distress.  HENT:  Head: Normocephalic and atraumatic.  Mouth/Throat: Oropharynx is clear and moist.  Eyes: Conjunctivae and EOM are normal. Pupils are equal, round, and reactive to light. No scleral icterus.  Neck: Normal range of motion. Neck supple. No JVD present. No thyromegaly present.  Cardiovascular: Normal rate, regular rhythm and intact distal pulses.  Exam reveals no gallop and no friction rub.   No murmur heard. Respiratory: He is in  respiratory distress. He has wheezes. He has no rales.  GI: Soft. Bowel sounds are normal. He exhibits no distension. There is no tenderness.  Musculoskeletal: Normal range of motion. He exhibits no edema.  No arthritis, no gout  Lymphadenopathy:    He has no cervical adenopathy.  Neurological: He is alert and oriented to person, place, and time. No cranial nerve deficit.  No dysarthria, no aphasia  Skin: Skin is warm and dry. No rash noted. No erythema.  Psychiatric: He has a normal mood and affect. His behavior is normal. Judgment and thought content normal.    LABORATORY PANEL:   CBC  Recent Labs Lab 01/11/17 1435  WBC 12.5*  HGB 14.0  HCT 40.5  PLT 296   ------------------------------------------------------------------------------------------------------------------  Chemistries   Recent Labs Lab 01/11/17 1435  NA 131*  K 4.2  CL 90*  CO2 32  GLUCOSE 112*  BUN 10  CREATININE 0.80  CALCIUM 8.8*   ------------------------------------------------------------------------------------------------------------------  Cardiac Enzymes  Recent Labs Lab 01/11/17 1435  TROPONINI  <0.03   ------------------------------------------------------------------------------------------------------------------  RADIOLOGY:  Dg Chest Portable 1 View  Result Date: 01/11/2017 CLINICAL DATA:  Acute shortness of breath.  History of lung cancer. EXAM: PORTABLE CHEST 1 VIEW COMPARISON:  12/24/2016 and prior exams FINDINGS: The cardiomediastinal silhouette is unremarkable. Right upper lobe nodular density is unchanged from most recent study. Right lower lobe interstitial opacities are now noted and nonspecific but may represent focal edema, inflammation or infection. There is no evidence of pleural effusion, pneumothorax or consolidation. IMPRESSION: New nonspecific right lower lobe interstitial opacities which may represent infection, focal edema or inflammation. Unchanged right upper lobe nodule. Electronically Signed   By: Margarette Canada M.D.   On: 01/11/2017 15:18    EKG:   Orders placed or performed during the hospital encounter of 01/11/17  . ED EKG  . ED EKG  . EKG 12-Lead  . EKG 12-Lead    IMPRESSION AND PLAN:  Principal Problem:   Acute exacerbation of chronic obstructive pulmonary disease (COPD) (HCC) - IV steroids, duo nebs, antitussive, antibiotics as below Active Problems:   CAP (community acquired pneumonia) - IV antibiotics and other symptomatic treatment as above   CAD (coronary artery disease) - continue home meds   HTN (hypertension) - stable, continue home meds   GERD (gastroesophageal reflux disease) - home dose PPI   Recurrent major depressive disorder, in partial remission ([place]) - continue home antidepressant  All the records are reviewed and case discussed with ED provider. Management plans discussed with the patient and/or family.  DVT PROPHYLAXIS: SubQ heparin  GI PROPHYLAXIS: PPI  ADMISSION STATUS: Inpatient  CODE STATUS: Full    Code Status Orders        Start     Ordered   01/11/17 2005  Full code  Continuous     01/11/17 2008    Code  Status History    Date Active Date Inactive Code Status Order ID Comments User Context   01/11/2017  2:34 PM 01/11/2017  8:08 PM Full Code [id number]  Delman Kitten, MD ED      TOTAL TIME TAKING CARE OF THIS PATIENT: 45 minutes.    Leathia Farnell FIELDING 01/11/2017, 9:34 PM  [facility]  Office  [phone]  CC: Primary care physician; Adin Hector, MD

## 2017-01-11 NOTE — Progress Notes (Signed)
Patient with Acute COPD exacerbation Reports of patient wanting bipap to come off. Case discussed with RT, patient much improved and will attempt to wean off biPAP after aggressive BD therapy.  Will need to re-assess resp status and assess admission to gen med floor with telemetry.    Corrin Parker, M.D.  [facility]  Medical Director [place] Director [facility] Cardio-Pulmonary Department

## 2017-01-11 NOTE — ED Triage Notes (Signed)
Pt arrived via ems for c/o shortness of breath since 12noon - pt does have lung cancer and is being treated - pt arrived on bi-pap - ems gave duoneb, albuterol, and solumedrol - pt has congested lung sounds on the right side

## 2017-01-11 NOTE — ED Notes (Signed)
This RN to bedside at this time. Explained that this RN would be signing out to next shift. Pt states understanding. Pt noted to be maintaining O2 saturations at 95-97% at this time.

## 2017-01-11 NOTE — ED Notes (Signed)
Pt's wife called to update room and pt status, phone given to pt

## 2017-01-12 ENCOUNTER — Inpatient Hospital Stay: Payer: Medicare Other

## 2017-01-12 LAB — BLOOD CULTURE ID PANEL (REFLEXED)
ACINETOBACTER BAUMANNII: NOT DETECTED
CANDIDA GLABRATA: NOT DETECTED
CANDIDA TROPICALIS: NOT DETECTED
Candida albicans: NOT DETECTED
Candida krusei: NOT DETECTED
Candida parapsilosis: NOT DETECTED
ENTEROCOCCUS SPECIES: NOT DETECTED
ESCHERICHIA COLI: NOT DETECTED
Enterobacter cloacae complex: NOT DETECTED
Enterobacteriaceae species: NOT DETECTED
Haemophilus influenzae: NOT DETECTED
KLEBSIELLA OXYTOCA: NOT DETECTED
Klebsiella pneumoniae: NOT DETECTED
Listeria monocytogenes: NOT DETECTED
Methicillin resistance: DETECTED — AB
NEISSERIA MENINGITIDIS: NOT DETECTED
Proteus species: NOT DETECTED
Pseudomonas aeruginosa: NOT DETECTED
SERRATIA MARCESCENS: NOT DETECTED
STAPHYLOCOCCUS SPECIES: DETECTED — AB
Staphylococcus aureus (BCID): NOT DETECTED
Streptococcus agalactiae: NOT DETECTED
Streptococcus pneumoniae: NOT DETECTED
Streptococcus pyogenes: NOT DETECTED
Streptococcus species: NOT DETECTED

## 2017-01-12 LAB — MRSA PCR SCREENING: MRSA by PCR: NEGATIVE

## 2017-01-12 MED ORDER — CEFTRIAXONE SODIUM-DEXTROSE 1-3.74 GM-% IV SOLR
1.0000 g | INTRAVENOUS | Status: DC
  Administered 2017-01-12 – 2017-01-13 (×2): 1 g via INTRAVENOUS
  Filled 2017-01-12 (×2): qty 50

## 2017-01-12 MED ORDER — ENOXAPARIN SODIUM 40 MG/0.4ML ~~LOC~~ SOLN
40.0000 mg | SUBCUTANEOUS | Status: DC
  Administered 2017-01-12: 20:00:00 40 mg via SUBCUTANEOUS
  Filled 2017-01-12: qty 0.4

## 2017-01-12 MED ORDER — CEFTRIAXONE SODIUM-DEXTROSE 1-3.74 GM-% IV SOLR: 1.0000 g | INTRAVENOUS | Status: DC

## 2017-01-12 MED ORDER — CLOPIDOGREL BISULFATE 75 MG PO TABS
75.0000 mg | ORAL_TABLET | Freq: Every day | ORAL | Status: DC
  Administered 2017-01-12 – 2017-01-13 (×2): 75 mg via ORAL
  Filled 2017-01-12 (×2): qty 1

## 2017-01-12 MED ORDER — PANTOPRAZOLE SODIUM 40 MG PO TBEC
40.0000 mg | DELAYED_RELEASE_TABLET | Freq: Every day | ORAL | Status: DC
  Administered 2017-01-12: 20:00:00 40 mg via ORAL
  Filled 2017-01-12: qty 1

## 2017-01-12 MED ORDER — VANCOMYCIN HCL IN DEXTROSE 1-5 GM/200ML-% IV SOLN
1000.0000 mg | Freq: Two times a day (BID) | INTRAVENOUS | Status: DC
  Administered 2017-01-12 – 2017-01-13 (×2): 1000 mg via INTRAVENOUS
  Filled 2017-01-12 (×4): qty 200

## 2017-01-12 MED ORDER — OXYCODONE HCL 5 MG PO TABS
5.0000 mg | ORAL_TABLET | Freq: Four times a day (QID) | ORAL | Status: DC | PRN
  Administered 2017-01-12: 5 mg via ORAL
  Filled 2017-01-12: qty 1

## 2017-01-12 MED ORDER — BUDESONIDE 0.25 MG/2ML IN SUSP
0.2500 mg | Freq: Two times a day (BID) | RESPIRATORY_TRACT | Status: DC
  Administered 2017-01-12 – 2017-01-13 (×2): 0.25 mg via RESPIRATORY_TRACT
  Filled 2017-01-12 (×2): qty 2

## 2017-01-12 NOTE — Progress Notes (Signed)
Pharmacy Antibiotic Note  NISHANTH MCCAUGHAN is a 66 y.o. male admitted on 01/11/2017 with pneumonia.  Pharmacy has been consulted for ceftriaxone dosing.  Plan: Ceftriaxone 1 gram q 24 hours ordered.  Height: '5\' 8"'$  (172.7 cm) Weight: 147 lb (66.7 kg) IBW/kg (Calculated) : 68.4  Temp (24hrs), Avg:99.1 F (37.3 C), Min:98.2 F (36.8 C), Max:100 F (37.8 C)   Recent Labs Lab 01/11/17 1435 01/11/17 1438  WBC 12.5*  --   CREATININE 0.80  --   LATICACIDVEN  --  1.9    Estimated Creatinine Clearance: 83.4 mL/min (by C-G formula based on SCr of 0.8 mg/dL).    Allergies  Allergen Reactions  . Zolpidem Other (See Comments)    agitation  . Belladonna Alkaloids   . Sulfa Antibiotics     Antimicrobials this admission: ceftriaxone 1/28 >>  azithromycin 1/28 >>   Dose adjustments this admission:   Microbiology results: 1/29 BCx: pending    1/28 CXR RLL opacity  Thank you for allowing pharmacy to be a part of this patient's care.  Ryland Tungate S 01/12/2017 12:15 AM

## 2017-01-12 NOTE — Progress Notes (Signed)
[facility] [place] NAME: Ryan Jarvis    MR#:  [id number]  DATE OF BIRTH:  31-Mar-1951  SUBJECTIVE:  CHIEF COMPLAINT:  Patient's shortness of breath better. Diagnosed with lung cancer scheduled for radiation therapy by Dr. Donella Stade next week  REVIEW OF SYSTEMS:  CONSTITUTIONAL: No fever, fatigue or weakness.  EYES: No blurred or double vision.  EARS, NOSE, AND THROAT: No tinnitus or ear pain.  RESPIRATORY: Reporting cough, shortness of breath, wheezing CARDIOVASCULAR: No chest pain, orthopnea, edema.  GASTROINTESTINAL: No nausea, vomiting, diarrhea or abdominal pain.  GENITOURINARY: No dysuria, hematuria.  ENDOCRINE: No polyuria, nocturia,  HEMATOLOGY: No anemia, easy bruising or bleeding SKIN: No rash or lesion. MUSCULOSKELETAL: No joint pain or arthritis.   NEUROLOGIC: No tingling, numbness, weakness.  PSYCHIATRY: No anxiety or depression.   DRUG ALLERGIES:   Allergies  Allergen Reactions  . Zolpidem Other (See Comments)    agitation  . Belladonna Alkaloids   . Sulfa Antibiotics     VITALS:  Blood pressure 126/63, pulse (!) 111, temperature 98.7 F (37.1 C), resp. rate 18, height '5\' 6"'$  (1.676 m), weight 66.8 kg (147 lb 4.8 oz), SpO2 97 %.  PHYSICAL EXAMINATION:  GENERAL:  66 y.o.-year-old patient lying in the bed with no acute distress.  EYES: Pupils equal, round, reactive to light and accommodation. No scleral icterus. Extraocular muscles intact.  HEENT: Head atraumatic, normocephalic. Oropharynx and nasopharynx clear.  NECK:  Supple, no jugular venous distention. No thyroid enlargement, no tenderness.  LUNGS: Moderate breath sounds bilaterally, no wheezing, rales,rhonchi or crepitation. No use of accessory muscles of respiration.  CARDIOVASCULAR: S1, S2 normal. No murmurs, rubs, or gallops.  ABDOMEN: Soft, nontender, nondistended. Bowel sounds present. No organomegaly or mass.  EXTREMITIES: No pedal edema, cyanosis,  or clubbing.  NEUROLOGIC: Cranial nerves II through XII are intact. Muscle strength 5/5 in all extremities. Sensation intact. Gait not checked.  PSYCHIATRIC: The patient is alert and oriented x 3.  SKIN: No obvious rash, lesion, or ulcer.    LABORATORY PANEL:   CBC  Recent Labs Lab 01/11/17 1435  WBC 12.5*  HGB 14.0  HCT 40.5  PLT 296   ------------------------------------------------------------------------------------------------------------------  Chemistries   Recent Labs Lab 01/11/17 1435  NA 131*  K 4.2  CL 90*  CO2 32  GLUCOSE 112*  BUN 10  CREATININE 0.80  CALCIUM 8.8*   ------------------------------------------------------------------------------------------------------------------  Cardiac Enzymes  Recent Labs Lab 01/11/17 1435  TROPONINI <0.03   ------------------------------------------------------------------------------------------------------------------  RADIOLOGY:  Dg Chest Port 1 View  Result Date: 01/12/2017 CLINICAL DATA:  Pneumonia.  History of COPD. EXAM: PORTABLE CHEST 1 VIEW COMPARISON:  Single-view of the chest 01/11/2017 and 12/24/2016. FINDINGS: Right lower lobe airspace disease has improved since the most recent examination. Right upper lobe pulmonary nodule is unchanged. The lungs are emphysematous. Left lung is clear. No pneumothorax or pleural effusion. IMPRESSION: Improved right lower lobe airspace disease. Right upper lobe pulmonary nodule. Emphysema. Electronically Signed   By: Inge Rise M.D.   On: 01/12/2017 07:21   Dg Chest Portable 1 View  Result Date: 01/11/2017 CLINICAL DATA:  Acute shortness of breath.  History of lung cancer. EXAM: PORTABLE CHEST 1 VIEW COMPARISON:  12/24/2016 and prior exams FINDINGS: The cardiomediastinal silhouette is unremarkable. Right upper lobe nodular density is unchanged from most recent study. Right lower lobe interstitial opacities are now noted and nonspecific but may represent focal  edema, inflammation or infection. There is no evidence of pleural  effusion, pneumothorax or consolidation. IMPRESSION: New nonspecific right lower lobe interstitial opacities which may represent infection, focal edema or inflammation. Unchanged right upper lobe nodule. Electronically Signed   By: Margarette Canada M.D.   On: 01/11/2017 15:18    EKG:   Orders placed or performed during the hospital encounter of 01/11/17  . EKG 12-Lead  . EKG 12-Lead    ASSESSMENT AND PLAN:    Acute exacerbation of chronic obstructive pulmonary disease (COPD) (HCC) - Clinically better Continue IV steroids, duo nebs, antitussive, antibiotics as below    CAP (community acquired pneumonia) -  Continue IV Rocephin and vancomycin IV antibiotics  Supportive treatment     CAD (coronary artery disease) - Plavix    HTN (hypertension) - stable, home medication metoprolol now on hold   GERD (gastroesophageal reflux disease) - home dose PPI Protonix 40 mg once daily     Recurrent major depressive disorder, in partial remission ([place]) - continue home antidepressant    All the records are reviewed and case discussed with Care Management/Social Workerr. Management plans discussed with the patient, family and they are in agreement.  CODE STATUS: fc   TOTAL TIME TAKING CARE OF THIS PATIENT: 37  minutes.   POSSIBLE D/C IN 2 DAYS, DEPENDING ON CLINICAL CONDITION.  Note: This dictation was prepared with Dragon dictation along with smaller phrase technology. Any transcriptional errors that result from this process are unintentional.   Nicholes Mango M.D on 01/12/2017 at 5:37 PM  Between 7am to 6pm - Pager - [phone] After 6pm go to [email or web address] - password [email or web address] [facility]  [facility]  Office  [phone]  CC: Primary care physician; Adin Hector, MD

## 2017-01-12 NOTE — Progress Notes (Signed)
Pt uses home O2 at 3 lpm

## 2017-01-12 NOTE — Progress Notes (Signed)
Pharmacy Antibiotic Note  Ryan Jarvis is a 66 y.o. male admitted on 01/11/2017 with bacteremia.  Pharmacy has been consulted for vancomycin dosing.  Plan: Vancomycin 1000 mg IV every 12 hours.  Goal trough 15-20 mcg/mL.  Trough before 4th dose.   Ke 0.071, half life 9.8 h, Vd 46.8 L  Will order SCr for AM to monitor renal function.    Height: '5\' 6"'$  (167.6 cm) Weight: 147 lb 4.8 oz (66.8 kg) IBW/kg (Calculated) : 63.8  Temp (24hrs), Avg:98.3 F (36.8 C), Min:98.1 F (36.7 C), Max:98.7 F (37.1 C)   Recent Labs Lab 01/11/17 1435 01/11/17 1438  WBC 12.5*  --   CREATININE 0.80  --   LATICACIDVEN  --  1.9    Estimated Creatinine Clearance: 79.8 mL/min (by C-G formula based on SCr of 0.8 mg/dL).    Allergies  Allergen Reactions  . Zolpidem Other (See Comments)    agitation  . Belladonna Alkaloids   . Sulfa Antibiotics     Antimicrobials this admission: Vanc/zosyn x1 dose each 1/28 CTX/azithro 1/29 >> Vanc 1/29 >> (started for bacteremia)    Dose adjustments this admission:   Microbiology results: BCx 1/2 GPC - BCID staph spp 1/4 bottles MRSA PCR neg Flu neg   Thank you for allowing pharmacy to be a part of this patient's care.  Rocky Morel 01/12/2017 2:53 PM

## 2017-01-12 NOTE — Progress Notes (Signed)
PHARMACY - PHYSICIAN COMMUNICATION CRITICAL VALUE ALERT - BLOOD CULTURE IDENTIFICATION (BCID)  Results for orders placed or performed during the hospital encounter of 01/11/17  Blood Culture ID Panel (Reflexed) (Collected: 01/11/2017  2:38 PM)  Result Value Ref Range   Enterococcus species NOT DETECTED NOT DETECTED   Listeria monocytogenes NOT DETECTED NOT DETECTED   Staphylococcus species DETECTED (A) NOT DETECTED   Staphylococcus aureus NOT DETECTED NOT DETECTED   Methicillin resistance DETECTED (A) NOT DETECTED   Streptococcus species NOT DETECTED NOT DETECTED   Streptococcus agalactiae NOT DETECTED NOT DETECTED   Streptococcus pneumoniae NOT DETECTED NOT DETECTED   Streptococcus pyogenes NOT DETECTED NOT DETECTED   Acinetobacter baumannii NOT DETECTED NOT DETECTED   Enterobacteriaceae species NOT DETECTED NOT DETECTED   Enterobacter cloacae complex NOT DETECTED NOT DETECTED   Escherichia coli NOT DETECTED NOT DETECTED   Klebsiella oxytoca NOT DETECTED NOT DETECTED   Klebsiella pneumoniae NOT DETECTED NOT DETECTED   Proteus species NOT DETECTED NOT DETECTED   Serratia marcescens NOT DETECTED NOT DETECTED   Haemophilus influenzae NOT DETECTED NOT DETECTED   Neisseria meningitidis NOT DETECTED NOT DETECTED   Pseudomonas aeruginosa NOT DETECTED NOT DETECTED   Candida albicans NOT DETECTED NOT DETECTED   Candida glabrata NOT DETECTED NOT DETECTED   Candida krusei NOT DETECTED NOT DETECTED   Candida parapsilosis NOT DETECTED NOT DETECTED   Candida tropicalis NOT DETECTED NOT DETECTED  Per report 1/4 bottles (anaerobic bottle)   Name of physician (or Provider) Contacted: Dr Margaretmary Eddy text paged regarding infection vs contaminant and ?need for vanc  Changes to prescribed antibiotics required: MD would like to start vanc as pt is immunocompromised  Rocky Morel 01/12/2017  1:41 PM

## 2017-01-13 LAB — BASIC METABOLIC PANEL
ANION GAP: 8 (ref 5–15)
BUN: 14 mg/dL (ref 6–20)
CO2: 29 mmol/L (ref 22–32)
Calcium: 8.8 mg/dL — ABNORMAL LOW (ref 8.9–10.3)
Chloride: 95 mmol/L — ABNORMAL LOW (ref 101–111)
Creatinine, Ser: 0.63 mg/dL (ref 0.61–1.24)
GFR calc Af Amer: >60 mL/min (ref >60)
GFR calc non Af Amer: >60 mL/min (ref >60)
GLUCOSE: 117 mg/dL — AB (ref 65–99)
POTASSIUM: 4.1 mmol/L (ref 3.5–5.1)
Sodium: 132 mmol/L — ABNORMAL LOW (ref 135–145)

## 2017-01-13 LAB — CBC
HEMATOCRIT: 35.3 % — AB (ref 40.0–52.0)
Hemoglobin: 12.4 g/dL — ABNORMAL LOW (ref 13.0–18.0)
MCH: 32.4 pg (ref 26.0–34.0)
MCHC: 35 g/dL (ref 32.0–36.0)
MCV: 92.5 fL (ref 80.0–100.0)
Platelets: 278 10*3/uL (ref 150–440)
RBC: 3.82 MIL/uL — AB (ref 4.40–5.90)
RDW: 14.4 % (ref 11.5–14.5)
WBC: 10.7 10*3/uL — AB (ref 3.8–10.6)

## 2017-01-13 MED ORDER — PREDNISONE 10 MG (21) PO TBPK: 10.0000 mg | ORAL_TABLET | Freq: Every day | ORAL | 0 refills | Status: DC

## 2017-01-13 MED ORDER — SACCHAROMYCES BOULARDII 250 MG PO CAPS: 250.0000 mg | ORAL_CAPSULE | Freq: Two times a day (BID) | ORAL | 0 refills | Status: AC

## 2017-01-13 MED ORDER — SACCHAROMYCES BOULARDII 250 MG PO CAPS: 250.0000 mg | ORAL_CAPSULE | Freq: Two times a day (BID) | ORAL | Status: DC

## 2017-01-13 MED ORDER — AMOXICILLIN-POT CLAVULANATE 875-125 MG PO TABS: 1.0000 | ORAL_TABLET | Freq: Two times a day (BID) | ORAL | 0 refills | Status: DC

## 2017-01-13 MED ORDER — AZITHROMYCIN 250 MG PO TABS
ORAL_TABLET | ORAL | 0 refills | Status: DC
Start: 2017-01-13 — End: 2017-06-08

## 2017-01-13 MED ORDER — ACETAMINOPHEN 325 MG PO TABS: 650.0000 mg | ORAL_TABLET | ORAL | Status: AC | PRN

## 2017-01-13 NOTE — Discharge Summary (Addendum)
[facility] NAME: Ryan Jarvis    MR#:  [id number]  DATE OF BIRTH:  [birth year]  DATE OF ADMISSION:  01/11/2017 ADMITTING PHYSICIAN: Flora Lipps, MD  DATE OF DISCHARGE: 01/13/17 PRIMARY CARE PHYSICIAN: Tama High III, MD    ADMISSION DIAGNOSIS:  Healthcare-associated pneumonia [J18.9] COPD exacerbation ([place]) [J44.1] Sepsis, due to unspecified organism ([place]) [A41.9]  DISCHARGE DIAGNOSIS:  Principal Problem:   Acute exacerbation of chronic obstructive pulmonary disease (COPD) ([place]) Active Problems:   Benign essential tremor   CAD (coronary artery disease)   GERD (gastroesophageal reflux disease)   HTN (hypertension)   Recurrent major depressive disorder, in partial remission ([place])   CAP (community acquired pneumonia)   SECONDARY DIAGNOSIS:   Past Medical History:  Diagnosis Date  . Anxiety   . Arthritis   . Cancer ([place])   . COPD (chronic obstructive pulmonary disease) ([place])   . Dyspnea   . Emphysema of lung ([place])   . GERD (gastroesophageal reflux disease)   . Hypertension   . Myocardial infarction   . Tremors of nervous system     HOSPITAL COURSE:   History and physical Hakeem Frazzini  is a 66 y.o. male who presents with Acute episode of significant shortness of breath today. In the ED tonight he required BiPAP, but was able to be weaned off of it to nasal cannula. Workup shows possible pneumonia, with COPD exacerbation as well. Patient recently had a lung biopsy, and states that he feels like his lung biopsy site was where he was having the most trouble breathing. Hospitalists were called for admission  Hospital course  Acute exacerbation of chronic obstructive pulmonary disease (COPD) ([place]) - Clinically better Taper IV steroids, duo nebs, antitussive, antibiotics as below  CAP (community acquired pneumonia) -  Clinically improved with IV Rocephin and vancomycin. Discharge home with by mouth  Augmentin Nebulizer treatments as needed Continue 2 L of home oxygen via nasal cannula during that time Patient was ambulating without need of oxygen today according to the nurse report Supportive treatment   CAD (coronary artery disease) - Plavix  HTN (hypertension) - stable, home medication metoprolol now on hold  GERD (gastroesophageal reflux disease) - home dose PPI Protonix 40 mg once daily  DISCHARGE CONDITIONS:   FAIR  CONSULTS OBTAINED:  Treatment Team:  Flora Lipps, MD   PROCEDURES NONE  DRUG ALLERGIES:   Allergies  Allergen Reactions  . Zolpidem Other (See Comments)    agitation  . Belladonna Alkaloids   . Sulfa Antibiotics     DISCHARGE MEDICATIONS:   Current Discharge Medication List    START taking these medications   Details  acetaminophen (TYLENOL) 325 MG tablet Take 2 tablets (650 mg total) by mouth every 4 (four) hours as needed for mild pain (temp > 101.5).    amoxicillin-clavulanate (AUGMENTIN) 875-125 MG tablet Take 1 tablet by mouth 2 (two) times daily. Qty: 20 tablet, Refills: 0    azithromycin (ZITHROMAX) 250 MG tablet Take 1 tablet once daily for 4 days Qty: 6 each, Refills: 0    predniSONE (STERAPRED UNI-PAK 21 TAB) 10 MG (21) TBPK tablet Take 1 tablet (10 mg total) by mouth daily. Take 6 tablets by mouth for 1 day followed by  5 tablets by mouth for 1 day followed by  4 tablets by mouth for 1 day followed by  3 tablets by mouth for 1 day followed by  2 tablets by mouth for 1  day followed by  1 tablet by mouth for a day and stop Qty: 21 tablet, Refills: 0    saccharomyces boulardii (FLORASTOR) 250 MG capsule Take 1 capsule (250 mg total) by mouth 2 (two) times daily. Qty: 30 capsule, Refills: 0      CONTINUE these medications which have NOT CHANGED   Details  albuterol (PROVENTIL HFA;VENTOLIN HFA) 108 (90 Base) MCG/ACT inhaler Inhale into the lungs.    atorvastatin (LIPITOR) 40 MG tablet Take by mouth.    benzonatate  (TESSALON) 100 MG capsule Take 200 mg by mouth 3 (three) times daily as needed for cough.    Cholecalciferol (VITAMIN D-1000 MAX ST) 1000 units tablet Take 1 tablet by mouth 1 day or 1 dose.    clopidogrel (PLAVIX) 75 MG tablet Take 75 mg by mouth daily.     diphenhydramine-acetaminophen (TYLENOL PM) 25-500 MG TABS tablet Take 1 tablet by mouth at bedtime as needed.    DULoxetine (CYMBALTA) 30 MG capsule Take 1 capsule by mouth daily.     Fluticasone-Salmeterol (ADVAIR DISKUS) 500-50 MCG/DOSE AEPB INHALE ONE DOSE BY MOUTH EVERY 12 HOURS    furosemide (LASIX) 80 MG tablet TAKE ONE TABLET BY MOUTH ONCE DAILY    gabapentin (NEURONTIN) 300 MG capsule Take 600 mg by mouth at bedtime.     HYDROcodone-acetaminophen (NORCO) 7.5-325 MG tablet Take 1 tablet by mouth every 6 (six) hours as needed.    metolazone (ZAROXOLYN) 5 MG tablet Take 1 tablet by mouth as needed.     metoprolol succinate (TOPROL-XL) 100 MG 24 hr tablet TAKE ONE TABLET BY MOUTH ONCE DAILY    pantoprazole (PROTONIX) 40 MG tablet Take 40 mg by mouth 2 (two) times daily.     potassium chloride SA (K-DUR,KLOR-CON) 20 MEQ tablet Take 1 tablet by mouth 1 day or 1 dose.    primidone (MYSOLINE) 250 MG tablet TAKE 1.5 TABLET BY twice daily    QUEtiapine (SEROQUEL) 25 MG tablet Take 25 mg by mouth at bedtime.    sucralfate (CARAFATE) 1 g tablet Take 1 g by mouth 2 (two) times daily.     tamsulosin (FLOMAX) 0.4 MG CAPS capsule TAKE ONE CAPSULE BY MOUTH ONCE DAILY 30 MINUTES AFTER THE SAME MEAL EACH DAY    theophylline (UNIPHYL) 400 MG 24 hr tablet Take 1 tablet by mouth daily.     tiotropium (SPIRIVA HANDIHALER) 18 MCG inhalation capsule INHALE ONE DOSE BY MOUTH ONCE DAILY    vitamin B-12 (CYANOCOBALAMIN) 1000 MCG tablet Take 1,000 mcg by mouth daily.         DISCHARGE INSTRUCTIONS:  Follow-up with primary care physician in a week Continue oxygen 2 L by nasal cannula daily at bedtime   DIET:  Cardiac diet  DISCHARGE  CONDITION:  Stable  ACTIVITY:  Activity as tolerated  OXYGEN:  Home Oxygen: Yes.     Oxygen Delivery: 2 liters/min via Patient connected to nasal cannula oxygen at bedtime  DISCHARGE LOCATION:  home   If you experience worsening of your admission symptoms, develop shortness of breath, life threatening emergency, suicidal or homicidal thoughts you must seek medical attention immediately by calling 911 or calling your MD immediately  if symptoms less severe.  You Must read complete instructions/literature along with all the possible adverse reactions/side effects for all the Medicines you take and that have been prescribed to you. Take any new Medicines after you have completely understood and accpet all the possible adverse reactions/side effects.   Please note  You  were cared for by a hospitalist during your hospital stay. If you have any questions about your discharge medications or the care you received while you were in the hospital after you are discharged, you can call the unit and asked to speak with the hospitalist on call if the hospitalist that took care of you is not available. Once you are discharged, your primary care physician will handle any further medical issues. Please note that NO REFILLS for any discharge medications will be authorized once you are discharged, as it is imperative that you return to your primary care physician (or establish a relationship with a primary care physician if you do not have one) for your aftercare needs so that they can reassess your need for medications and monitor your lab values.     Today  Chief Complaint  Patient presents with  . Shortness of Breath   Patient is clinically feeling much better. Shortness of breath improved. A little bit of cough okay to be discharged home  ROS:  CONSTITUTIONAL: Denies fevers, chills. Denies any fatigue, weakness.  EYES: Denies blurry vision, double vision, eye pain. EARS, NOSE, THROAT: Denies  tinnitus, ear pain, hearing loss. RESPIRATORY: Little cough, denies wheeze, shortness of breath.  CARDIOVASCULAR: Denies chest pain, palpitations, edema.  GASTROINTESTINAL: Denies nausea, vomiting, diarrhea, abdominal pain. Denies bright red blood per rectum. GENITOURINARY: Denies dysuria, hematuria. ENDOCRINE: Denies nocturia or thyroid problems. HEMATOLOGIC AND LYMPHATIC: Denies easy bruising or bleeding. SKIN: Denies rash or lesion. MUSCULOSKELETAL: Denies pain in neck, back, shoulder, knees, hips or arthritic symptoms.  NEUROLOGIC: Denies paralysis, paresthesias.  PSYCHIATRIC: Denies anxiety or depressive symptoms.   VITAL SIGNS:  Blood pressure (!) 142/89, pulse (!) 102, temperature 99.2 F (37.3 C), temperature source Oral, resp. rate 18, height '5\' 6"'$  (1.676 m), weight 65.5 kg (144 lb 4.8 oz), SpO2 99 %.  I/O:    Intake/Output Summary (Last 24 hours) at 01/13/17 1603 Last data filed at 01/13/17 1036  Gross per 24 hour  Intake              470 ml  Output             1960 ml  Net            -1490 ml    PHYSICAL EXAMINATION:  GENERAL:  66 y.o.-year-old patient lying in the bed with no acute distress.  EYES: Pupils equal, round, reactive to light and accommodation. No scleral icterus. Extraocular muscles intact.  HEENT: Head atraumatic, normocephalic. Oropharynx and nasopharynx clear.  NECK:  Supple, no jugular venous distention. No thyroid enlargement, no tenderness.  LUNGS: Good air entry. Moderate breath sounds bilaterally, no wheezing, rales,rhonchi or crepitation. No use of accessory muscles of respiration.  CARDIOVASCULAR: S1, S2 normal. No murmurs, rubs, or gallops.  ABDOMEN: Soft, non-tender, non-distended. Bowel sounds present. No organomegaly or mass.  EXTREMITIES: No pedal edema, cyanosis, or clubbing.  NEUROLOGIC: Cranial nerves II through XII are intact. Muscle strength 5/5 in all extremities. Sensation intact. Gait not checked.  PSYCHIATRIC: The patient is alert  and oriented x 3.  SKIN: No obvious rash, lesion, or ulcer.   DATA REVIEW:   CBC  Recent Labs Lab 01/13/17 0519  WBC 10.7*  HGB 12.4*  HCT 35.3*  PLT 278    Chemistries   Recent Labs Lab 01/13/17 0519  NA 132*  K 4.1  CL 95*  CO2 29  GLUCOSE 117*  BUN 14  CREATININE 0.63  CALCIUM 8.8*    Cardiac  Enzymes  Recent Labs Lab 01/11/17 1435  TROPONINI <0.03    Microbiology Results  Results for orders placed or performed during the hospital encounter of 01/11/17  Culture, blood (Routine X 2) w Reflex to ID Panel     Status: Abnormal (Preliminary result)   Collection Time: 01/11/17  2:38 PM  Result Value Ref Range Status   Specimen Description BLOOD R AC  Final   Special Requests   Final    BOTTLES DRAWN AEROBIC AND ANAEROBIC  AER 11CC, ANA 12CC   Culture  Setup Time   Final    GRAM POSITIVE COCCI IN BOTH AEROBIC AND ANAEROBIC BOTTLES CRITICAL RESULT CALLED TO, READ BACK BY AND VERIFIED WITH: CHRISTINE KATSOUDAS AT 4665 01/12/17 SDR    Culture (A)  Final    STAPHYLOCOCCUS SPECIES (COAGULASE NEGATIVE) THE SIGNIFICANCE OF ISOLATING THIS ORGANISM FROM A SINGLE SET OF BLOOD CULTURES WHEN MULTIPLE SETS ARE DRAWN IS UNCERTAIN. PLEASE NOTIFY THE MICROBIOLOGY DEPARTMENT WITHIN ONE WEEK IF SPECIATION AND SENSITIVITIES ARE REQUIRED. Performed at [facility], [place] [address] [place], [place] [address]    Report Status PENDING  Incomplete  Blood Culture ID Panel (Reflexed)     Status: Abnormal   Collection Time: 01/11/17  2:38 PM  Result Value Ref Range Status   Enterococcus species NOT DETECTED NOT DETECTED Final   Listeria monocytogenes NOT DETECTED NOT DETECTED Final   Staphylococcus species DETECTED (A) NOT DETECTED Final    Comment: CRITICAL RESULT CALLED TO, READ BACK BY AND VERIFIED WITH:  CHRISTINE KATSOUDAS AT 0177 01/12/17 SDR    Staphylococcus aureus NOT DETECTED NOT DETECTED Final   Methicillin resistance DETECTED (A) NOT DETECTED Final    Comment:  CRITICAL RESULT CALLED TO, READ BACK BY AND VERIFIED WITH:  CHRISTINE KATSOUDAS AT 9390 01/12/17 SDR    Streptococcus species NOT DETECTED NOT DETECTED Final   Streptococcus agalactiae NOT DETECTED NOT DETECTED Final   Streptococcus pneumoniae NOT DETECTED NOT DETECTED Final   Streptococcus pyogenes NOT DETECTED NOT DETECTED Final   Acinetobacter baumannii NOT DETECTED NOT DETECTED Final   Enterobacteriaceae species NOT DETECTED NOT DETECTED Final   Enterobacter cloacae complex NOT DETECTED NOT DETECTED Final   Escherichia coli NOT DETECTED NOT DETECTED Final   Klebsiella oxytoca NOT DETECTED NOT DETECTED Final   Klebsiella pneumoniae NOT DETECTED NOT DETECTED Final   Proteus species NOT DETECTED NOT DETECTED Final   Serratia marcescens NOT DETECTED NOT DETECTED Final   Haemophilus influenzae NOT DETECTED NOT DETECTED Final   Neisseria meningitidis NOT DETECTED NOT DETECTED Final   Pseudomonas aeruginosa NOT DETECTED NOT DETECTED Final   Candida albicans NOT DETECTED NOT DETECTED Final   Candida glabrata NOT DETECTED NOT DETECTED Final   Candida krusei NOT DETECTED NOT DETECTED Final   Candida parapsilosis NOT DETECTED NOT DETECTED Final   Candida tropicalis NOT DETECTED NOT DETECTED Final  Culture, blood (Routine X 2) w Reflex to ID Panel     Status: None (Preliminary result)   Collection Time: 01/11/17  2:39 PM  Result Value Ref Range Status   Specimen Description BLOOD L AC  Final   Special Requests   Final    BOTTLES DRAWN AEROBIC AND ANAEROBIC  AER9CC, ANA 10CC   Culture NO GROWTH 2 DAYS  Final   Report Status PENDING  Incomplete  MRSA PCR Screening     Status: None   Collection Time: 01/12/17  1:18 AM  Result Value Ref Range Status   MRSA by PCR NEGATIVE  NEGATIVE Final    Comment:        The GeneXpert MRSA Assay (FDA approved for NASAL specimens only), is one component of a comprehensive MRSA colonization surveillance program. It is not intended to diagnose  MRSA infection nor to guide or monitor treatment for MRSA infections.     RADIOLOGY:  Dg Chest Port 1 View  Result Date: 01/12/2017 CLINICAL DATA:  Pneumonia.  History of COPD. EXAM: PORTABLE CHEST 1 VIEW COMPARISON:  Single-view of the chest 01/11/2017 and 12/24/2016. FINDINGS: Right lower lobe airspace disease has improved since the most recent examination. Right upper lobe pulmonary nodule is unchanged. The lungs are emphysematous. Left lung is clear. No pneumothorax or pleural effusion. IMPRESSION: Improved right lower lobe airspace disease. Right upper lobe pulmonary nodule. Emphysema. Electronically Signed   By: Inge Rise M.D.   On: 01/12/2017 07:21   Dg Chest Portable 1 View  Result Date: 01/11/2017 CLINICAL DATA:  Acute shortness of breath.  History of lung cancer. EXAM: PORTABLE CHEST 1 VIEW COMPARISON:  12/24/2016 and prior exams FINDINGS: The cardiomediastinal silhouette is unremarkable. Right upper lobe nodular density is unchanged from most recent study. Right lower lobe interstitial opacities are now noted and nonspecific but may represent focal edema, inflammation or infection. There is no evidence of pleural effusion, pneumothorax or consolidation. IMPRESSION: New nonspecific right lower lobe interstitial opacities which may represent infection, focal edema or inflammation. Unchanged right upper lobe nodule. Electronically Signed   By: Margarette Canada M.D.   On: 01/11/2017 15:18    EKG:   Orders placed or performed during the hospital encounter of 01/11/17  . EKG 12-Lead  . EKG 12-Lead      Management plans discussed with the patient, family and they are in agreement.  CODE STATUS:     Code Status Orders        Start     Ordered   01/11/17 2005  Full code  Continuous     01/11/17 2008    Code Status History    Date Active Date Inactive Code Status Order ID Comments User Context   01/11/2017  2:34 PM 01/11/2017  8:08 PM Full Code [id number]  Delman Kitten, MD ED       TOTAL TIME TAKING CARE OF THIS PATIENT: 45 minutes.   Note: This dictation was prepared with Dragon dictation along with smaller phrase technology. Any transcriptional errors that result from this process are unintentional.   '@M[facility]'$ @  on 01/13/2017 at 4:03 PM  Between 7am to 6pm - Pager - [phone]  After 6pm go to [email or web address] - password [email or web address] [facility]  [facility]  Office  [phone]  CC: Primary care physician; Adin Hector, MD

## 2017-01-13 NOTE — Care Management Important Message (Signed)
Important Message  Patient Details  Name: Ryan Jarvis MRN: [id number] Date of Birth: September 24, 1951   Medicare Important Message Given:  Yes    Shelbie Ammons, RN 01/13/2017, 12:05 PM

## 2017-01-13 NOTE — Discharge Instructions (Signed)
Follow-up with primary care physician in a week Continue oxygen 2 L by nasal cannula daily at bedtime

## 2017-01-14 LAB — CULTURE, BLOOD (ROUTINE X 2)

## 2017-01-16 LAB — CULTURE, BLOOD (ROUTINE X 2): Culture: NO GROWTH

## 2017-01-20 ENCOUNTER — Ambulatory Visit
Admission: RE | Admit: 2017-01-20 | Discharge: 2017-01-20 | Disposition: A | Discharge status: Home / Self Care | Payer: Medicare Other | Source: Ambulatory Visit | Attending: Radiation Oncology | Admitting: Radiation Oncology

## 2017-01-20 DIAGNOSIS — Z87891 Personal history of nicotine dependence: Secondary | ICD-10-CM | POA: Diagnosis not present

## 2017-01-20 DIAGNOSIS — Z510 Encounter for antineoplastic radiation therapy: Secondary | ICD-10-CM | POA: Diagnosis present

## 2017-01-20 DIAGNOSIS — C3411 Malignant neoplasm of upper lobe, right bronchus or lung: Secondary | ICD-10-CM | POA: Diagnosis not present

## 2017-01-20 DIAGNOSIS — J449 Chronic obstructive pulmonary disease, unspecified: Secondary | ICD-10-CM | POA: Diagnosis not present

## 2017-01-22 ENCOUNTER — Ambulatory Visit
Admission: RE | Admit: 2017-01-22 | Discharge: 2017-01-22 | Disposition: A | Discharge status: Home / Self Care | Payer: Medicare Other | Source: Ambulatory Visit | Attending: Radiation Oncology | Admitting: Radiation Oncology

## 2017-01-22 DIAGNOSIS — Z510 Encounter for antineoplastic radiation therapy: Secondary | ICD-10-CM | POA: Diagnosis not present

## 2017-01-25 DIAGNOSIS — J9611 Chronic respiratory failure with hypoxia: Secondary | ICD-10-CM | POA: Insufficient documentation

## 2017-01-25 DIAGNOSIS — C3491 Malignant neoplasm of unspecified part of right bronchus or lung: Secondary | ICD-10-CM | POA: Insufficient documentation

## 2017-01-27 ENCOUNTER — Ambulatory Visit
Admission: RE | Admit: 2017-01-27 | Discharge: 2017-01-27 | Disposition: A | Discharge status: Home / Self Care | Payer: Medicare Other | Source: Ambulatory Visit | Attending: Radiation Oncology | Admitting: Radiation Oncology

## 2017-01-27 DIAGNOSIS — Z510 Encounter for antineoplastic radiation therapy: Secondary | ICD-10-CM | POA: Diagnosis not present

## 2017-01-29 ENCOUNTER — Ambulatory Visit
Admission: RE | Admit: 2017-01-29 | Discharge: 2017-01-29 | Disposition: A | Discharge status: Home / Self Care | Payer: Medicare Other | Source: Ambulatory Visit | Attending: Radiation Oncology | Admitting: Radiation Oncology

## 2017-01-29 DIAGNOSIS — Z510 Encounter for antineoplastic radiation therapy: Secondary | ICD-10-CM | POA: Diagnosis not present

## 2017-02-03 ENCOUNTER — Ambulatory Visit
Admission: RE | Admit: 2017-02-03 | Discharge: 2017-02-03 | Disposition: A | Discharge status: Home / Self Care | Payer: Medicare Other | Source: Ambulatory Visit | Attending: Radiation Oncology | Admitting: Radiation Oncology

## 2017-02-03 DIAGNOSIS — Z510 Encounter for antineoplastic radiation therapy: Secondary | ICD-10-CM | POA: Diagnosis not present

## 2017-02-10 DIAGNOSIS — I340 Nonrheumatic mitral (valve) insufficiency: Secondary | ICD-10-CM | POA: Insufficient documentation

## 2017-02-10 DIAGNOSIS — I714 Abdominal aortic aneurysm, without rupture, unspecified: Secondary | ICD-10-CM | POA: Insufficient documentation

## 2017-02-28 DIAGNOSIS — Z8719 Personal history of other diseases of the digestive system: Secondary | ICD-10-CM | POA: Insufficient documentation

## 2017-02-28 DIAGNOSIS — I5189 Other ill-defined heart diseases: Secondary | ICD-10-CM | POA: Insufficient documentation

## 2017-03-02 ENCOUNTER — Other Ambulatory Visit: Payer: Self-pay | Admitting: *Deleted

## 2017-03-02 ENCOUNTER — Ambulatory Visit
Admission: RE | Admit: 2017-03-02 | Discharge: 2017-03-02 | Disposition: A | Discharge status: Home / Self Care | Payer: Medicare Other | Source: Ambulatory Visit | Attending: Radiation Oncology | Admitting: Radiation Oncology

## 2017-03-02 VITALS — BP 123/76 | HR 81 | Temp 97.0°F | Wt 149.3 lb

## 2017-03-02 DIAGNOSIS — C3411 Malignant neoplasm of upper lobe, right bronchus or lung: Secondary | ICD-10-CM | POA: Diagnosis present

## 2017-03-02 DIAGNOSIS — Z923 Personal history of irradiation: Secondary | ICD-10-CM | POA: Diagnosis not present

## 2017-03-02 DIAGNOSIS — F17210 Nicotine dependence, cigarettes, uncomplicated: Secondary | ICD-10-CM | POA: Insufficient documentation

## 2017-03-02 NOTE — Progress Notes (Signed)
Patient here for follow up no changes since last appointment 

## 2017-03-02 NOTE — Progress Notes (Signed)
Radiation Oncology Follow up Note  Name: Ryan Jarvis   Date:   03/02/2017 MRN:  [id number] DOB: [birth year]    This 66 y.o. male presents to the clinic today for one-month follow-up status post SB RT to his right upper lobe for adenocarcinoma.  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 Patient is oxygen dependent heavy smoker seen today in routine follow-up who is doing well he specifically states he has not seen any change in his pulmonary status. He's having no cough dysphasia or chest tightness..  COMPLICATIONS OF TREATMENT: none  FOLLOW UP COMPLIANCE: keeps appointments   PHYSICAL EXAM:  BP 123/76 (BP Location: Left Arm, Patient Position: Sitting)   Pulse 81   Temp 97 F (36.1 C) (Tympanic)   Wt 149 lb 4 oz (67.7 kg)   BMI 24.09 kg/m  Well-developed male on nasal oxygen in NAD. Well-developed well-nourished patient in NAD. HEENT reveals PERLA, EOMI, discs not visualized.  Oral cavity is clear. No oral mucosal lesions are identified. Neck is clear without evidence of cervical or supraclavicular adenopathy. Lungs are clear to A&P. Cardiac examination is essentially unremarkable with regular rate and rhythm without murmur rub or thrill. Abdomen is benign with no organomegaly or masses noted. Motor sensory and DTR levels are equal and symmetric in the upper and lower extremities. Cranial nerves II through XII are grossly intact. Proprioception is intact. No peripheral adenopathy or edema is identified. No motor or sensory levels are noted. Crude visual fields are within normal range.  RADIOLOGY RESULTS: No current films for review  PLAN: Present time patient is doing extremely well 1 month out from SB RT with no side effects. A wait another 3 months obtain a CT scan prior to his next follow-up visit. Patient continues to do extremely well contact us with any  problems. Appointments were made.  I would like to take this opportunity to thank you for allowing me to participate in the care of your patient.Armstead Peaks., MD

## 2017-06-01 ENCOUNTER — Ambulatory Visit
Admission: RE | Admit: 2017-06-01 | Discharge: 2017-06-01 | Disposition: A | Discharge status: Home / Self Care | Payer: Medicare Other | Source: Ambulatory Visit | Attending: Radiation Oncology | Admitting: Radiation Oncology

## 2017-06-01 DIAGNOSIS — J432 Centrilobular emphysema: Secondary | ICD-10-CM | POA: Diagnosis not present

## 2017-06-01 DIAGNOSIS — C3411 Malignant neoplasm of upper lobe, right bronchus or lung: Secondary | ICD-10-CM | POA: Diagnosis not present

## 2017-06-01 DIAGNOSIS — I700 Atherosclerosis of aorta: Secondary | ICD-10-CM | POA: Insufficient documentation

## 2017-06-01 DIAGNOSIS — I2510 Atherosclerotic heart disease of native coronary artery without angina pectoris: Secondary | ICD-10-CM | POA: Insufficient documentation

## 2017-06-01 DIAGNOSIS — I719 Aortic aneurysm of unspecified site, without rupture: Secondary | ICD-10-CM | POA: Insufficient documentation

## 2017-06-01 MED ORDER — IOPAMIDOL (ISOVUE-300) INJECTION 61%
75.0000 mL | Freq: Once | INTRAVENOUS | Status: AC | PRN
  Administered 2017-06-01: 75 mL via INTRAVENOUS

## 2017-06-08 ENCOUNTER — Other Ambulatory Visit: Payer: Self-pay | Admitting: *Deleted

## 2017-06-08 ENCOUNTER — Ambulatory Visit
Admission: RE | Admit: 2017-06-08 | Discharge: 2017-06-08 | Disposition: A | Discharge status: Home / Self Care | Payer: Medicare Other | Source: Ambulatory Visit | Attending: Radiation Oncology | Admitting: Radiation Oncology

## 2017-06-08 ENCOUNTER — Encounter: Payer: Self-pay | Admitting: Radiation Oncology

## 2017-06-08 VITALS — BP 131/82 | HR 95 | Temp 97.0°F | Resp 22 | Wt 151.9 lb

## 2017-06-08 DIAGNOSIS — J439 Emphysema, unspecified: Secondary | ICD-10-CM | POA: Insufficient documentation

## 2017-06-08 DIAGNOSIS — C3411 Malignant neoplasm of upper lobe, right bronchus or lung: Secondary | ICD-10-CM | POA: Diagnosis not present

## 2017-06-08 DIAGNOSIS — F17210 Nicotine dependence, cigarettes, uncomplicated: Secondary | ICD-10-CM | POA: Insufficient documentation

## 2017-06-08 DIAGNOSIS — I719 Aortic aneurysm of unspecified site, without rupture: Secondary | ICD-10-CM | POA: Insufficient documentation

## 2017-06-08 DIAGNOSIS — Z923 Personal history of irradiation: Secondary | ICD-10-CM | POA: Diagnosis not present

## 2017-06-08 NOTE — Progress Notes (Signed)
Radiation Oncology Follow up Note  Name: Ryan Jarvis   Date:   06/08/2017 MRN:  [id number] DOB: Feb 21, 1951    This 66 y.o. male presents to the clinic today for four-month follow-up status post SB RT to right upper lobe for adenocarcinoma.  REFERRING PROVIDER: Adin Hector, MD  HPI: Patient is a 66 year old male now seen out 4 months having completed SB RT to his right upper lobe for a stage I adenocarcinoma. Patient's been a heavy smoker has known emphysema. He states she's doing fair although his pulmonary status continues to decline. He recently had a CT scan of his chest. Showing the right upper lobe nodule has decreased minimally in size with surrounding radiation effects. He does have a known aortic aneurysm which is being followed.  COMPLICATIONS OF TREATMENT: none  FOLLOW UP COMPLIANCE: keeps appointments   PHYSICAL EXAM:  BP 131/82   Pulse 95   Temp 97 F (36.1 C)   Resp (!) 22   Wt 151 lb 14.4 oz (68.9 kg)   BMI 24.52 kg/m  Frail-appearing elderly gentleman and in NAD. Well-developed well-nourished patient in NAD. HEENT reveals PERLA, EOMI, discs not visualized.  Oral cavity is clear. No oral mucosal lesions are identified. Neck is clear without evidence of cervical or supraclavicular adenopathy. Lungs are clear to A&P. Cardiac examination is essentially unremarkable with regular rate and rhythm without murmur rub or thrill. Abdomen is benign with no organomegaly or masses noted. Motor sensory and DTR levels are equal and symmetric in the upper and lower extremities. Cranial nerves II through XII are grossly intact. Proprioception is intact. No peripheral adenopathy or edema is identified. No motor or sensory levels are noted. Crude visual fields are within normal range.  RADIOLOGY RESULTS: CT scan is reviewed and compatible with the above-stated findings  PLAN: Present time from our standpoint patient is stable with no evidence of progression of disease in his chest.  Patient continues close follow-up care with pulmonology and is known to have home oxygen as well as multiple bronchodilators. I have asked to see him back in 6 months for follow-up and or reordered a CT scan of the chest prior to that visit. Patient is to call sooner with any concerns.  I would like to take this opportunity to thank you for allowing me to participate in the care of your patient.Armstead Peaks., MD

## 2017-06-09 DIAGNOSIS — I712 Thoracic aortic aneurysm, without rupture, unspecified: Secondary | ICD-10-CM | POA: Insufficient documentation

## 2017-10-11 DIAGNOSIS — G8929 Other chronic pain: Secondary | ICD-10-CM | POA: Insufficient documentation

## 2017-10-22 ENCOUNTER — Encounter: Payer: Self-pay | Admitting: Student in an Organized Health Care Education/Training Program

## 2017-10-22 ENCOUNTER — Ambulatory Visit
Payer: Medicare Other | Attending: Student in an Organized Health Care Education/Training Program | Admitting: Student in an Organized Health Care Education/Training Program

## 2017-10-22 VITALS — BP 114/76 | HR 97 | Temp 98.2°F | Resp 16 | Ht 67.0 in | Wt 137.0 lb

## 2017-10-22 DIAGNOSIS — C3411 Malignant neoplasm of upper lobe, right bronchus or lung: Secondary | ICD-10-CM | POA: Diagnosis not present

## 2017-10-22 DIAGNOSIS — Z923 Personal history of irradiation: Secondary | ICD-10-CM | POA: Diagnosis not present

## 2017-10-22 DIAGNOSIS — I2510 Atherosclerotic heart disease of native coronary artery without angina pectoris: Secondary | ICD-10-CM | POA: Diagnosis not present

## 2017-10-22 DIAGNOSIS — M48062 Spinal stenosis, lumbar region with neurogenic claudication: Secondary | ICD-10-CM | POA: Diagnosis not present

## 2017-10-22 DIAGNOSIS — Z79899 Other long term (current) drug therapy: Secondary | ICD-10-CM | POA: Insufficient documentation

## 2017-10-22 DIAGNOSIS — Z7902 Long term (current) use of antithrombotics/antiplatelets: Secondary | ICD-10-CM | POA: Diagnosis not present

## 2017-10-22 DIAGNOSIS — G629 Polyneuropathy, unspecified: Secondary | ICD-10-CM

## 2017-10-22 DIAGNOSIS — E785 Hyperlipidemia, unspecified: Secondary | ICD-10-CM | POA: Diagnosis not present

## 2017-10-22 DIAGNOSIS — G250 Essential tremor: Secondary | ICD-10-CM | POA: Diagnosis not present

## 2017-10-22 DIAGNOSIS — K279 Peptic ulcer, site unspecified, unspecified as acute or chronic, without hemorrhage or perforation: Secondary | ICD-10-CM | POA: Insufficient documentation

## 2017-10-22 DIAGNOSIS — F339 Major depressive disorder, recurrent, unspecified: Secondary | ICD-10-CM | POA: Diagnosis not present

## 2017-10-22 DIAGNOSIS — C3410 Malignant neoplasm of upper lobe, unspecified bronchus or lung: Secondary | ICD-10-CM | POA: Diagnosis not present

## 2017-10-22 DIAGNOSIS — E538 Deficiency of other specified B group vitamins: Secondary | ICD-10-CM | POA: Insufficient documentation

## 2017-10-22 DIAGNOSIS — G894 Chronic pain syndrome: Secondary | ICD-10-CM | POA: Diagnosis not present

## 2017-10-22 DIAGNOSIS — I252 Old myocardial infarction: Secondary | ICD-10-CM | POA: Insufficient documentation

## 2017-10-22 DIAGNOSIS — J449 Chronic obstructive pulmonary disease, unspecified: Secondary | ICD-10-CM | POA: Insufficient documentation

## 2017-10-22 DIAGNOSIS — Z7982 Long term (current) use of aspirin: Secondary | ICD-10-CM | POA: Diagnosis not present

## 2017-10-22 DIAGNOSIS — I10 Essential (primary) hypertension: Secondary | ICD-10-CM | POA: Diagnosis not present

## 2017-10-22 DIAGNOSIS — K219 Gastro-esophageal reflux disease without esophagitis: Secondary | ICD-10-CM | POA: Diagnosis not present

## 2017-10-22 DIAGNOSIS — F419 Anxiety disorder, unspecified: Secondary | ICD-10-CM | POA: Diagnosis not present

## 2017-10-22 DIAGNOSIS — Z981 Arthrodesis status: Secondary | ICD-10-CM | POA: Diagnosis not present

## 2017-10-22 DIAGNOSIS — Z87891 Personal history of nicotine dependence: Secondary | ICD-10-CM | POA: Diagnosis not present

## 2017-10-22 NOTE — Progress Notes (Signed)
Safety precautions to be maintained throughout the outpatient stay will include: orient to surroundings, keep bed in low position, maintain call bell within reach at all times, provide assistance with transfer out of bed and ambulation.  

## 2017-10-22 NOTE — Progress Notes (Signed)
Patient's Name: Ryan Jarvis  MRN: [id number]  Referring Provider: Adin Hector, MD  DOB: Mar 10, 1951  PCP: Adin Hector, MD  DOS: 10/22/2017  Note by: Gillis Santa, MD  Service setting: Ambulatory outpatient  Specialty: Interventional Pain Management  Location: [facility] (AMB) Pain Management Facility  Visit type: Initial Patient Evaluation  Patient type: New Patient   Primary Reason(s) for Visit: Encounter for initial evaluation of one or more chronic problems (new to examiner) potentially causing chronic pain, and posing a threat to normal musculoskeletal function. (Level of risk: High) CC: Back Pain (lower)  HPI  Ryan Jarvis is a 66 y.o. year old, male patient, who comes today to see [place] for the first time for an initial evaluation of his chronic pain. He has Pulmonary nodules; Abnormal CT of the chest; Hemoptysis; Pneumonia; Anemia; Benign essential tremor; CAD (coronary artery disease); COPD (chronic obstructive pulmonary disease) ([place]); Dyslipidemia; GERD (gastroesophageal reflux disease); HTN (hypertension); Peripheral polyneuropathy; PUD (peptic ulcer disease); Recurrent major depressive disorder, in partial remission ([place]); Senile purpura ([place]); Spinal stenosis; Vitamin B12 deficiency; Acute exacerbation of chronic obstructive pulmonary disease (COPD) ([place]); CAP (community acquired pneumonia); and Cancer of upper lobe of right lung ([place]) on their problem list. Today he comes in for evaluation of his Back Pain (lower)  Pain Assessment: Location: Lower Back Radiating:  With radiation to bilateral legs Onset: More than a month ago Duration:  Greater than 5 years, amplified after lumbar spine surgery Quality: Sharp Severity: 8 /10 (self-reported pain score)  Note: Reported level is inconsistent with clinical observations. Clinically the patient looks like a 5/10 A 5/10 is viewed as "Severe" and described as intense and extremely unpleasant. Associated with frowning face and frequent  crying. Pain overwhelms the senses.  Ability to do any activity or maintain social relationships becomes significantly limited. Conversation becomes difficult. Pacing back and forth is common, as getting into a comfortable position is nearly impossible. Pain wakes you up from deep sleep. Physical signs will be obvious: pupillary dilation; increased sweating; goosebumps; brisk reflexes; cold, clammy hands and feet; nausea, vomiting or dry heaves; loss of appetite; significant sleep disturbance with inability to fall asleep or to remain asleep. When persistent, significant weight loss is observed due to the complete loss of appetite and sleep deprivation.  Blood pressure and heart rate becomes significantly elevated.       When using our objective Pain Scale, levels between 6 and 10/10 are said to belong in an emergency room, as it progressively worsens from a 6/10, described as severely limiting, requiring emergency care not usually available at an outpatient pain management facility. At a 6/10 level, communication becomes difficult and requires great effort. Assistance to reach the emergency department may be required. Facial flushing and profuse sweating along with potentially dangerous increases in heart rate and blood pressure will be evident. Effect on ADL: limits daily chores Timing: Intermittent Modifying factors: rest  Onset and Duration: Gradual, worsen of the lumbar spine surgery per patient Cause of pain: Surgery Severity: Getting worse, NAS-11 at its worse: 10/10, NAS-11 at its best: 8/10, NAS-11 now: 8/10 and NAS-11 on the average: 8/10 Timing: During activity or exercise Aggravating Factors: Prolonged standing, Surgery made it worse, Walking, Walking uphill, Walking downhill and Working Alleviating Factors: Sitting Associated Problems: Swelling and Weakness Quality of Pain: Aching, Getting shorter and Sharp Previous Examinations or Tests: Cutaneous Pain Threshold Testing (CPT), Ct-Myelogram  and X-rays Previous Treatments: Physical Therapy  The patient comes into the  clinics today for the first time for a chronic pain management evaluation.   66 year old male with complex past medical history who presents with chronic pain localized to his axial lumbar spine.  Patient has a history of posterior lumbar fusion L1 through L4 and anterior lumbar fusion L4-L5 along with right foraminal stenosis at L1-L2.  Patient states that his lumbar surgeries have resulted in worsening back pain and lower extremity weakness.  Patient complains primarily of axial low back pain without significant radiation into his legs although the patient does endorse numbness and tingling of his lower extremities.  Patient has tried various treatment modalities including physical therapy for his lumbar spine which he does not want to do, medication therapy with various non-opioid and opioid analgesics, lumbar spine injections including epidurals in the remote past prior to his lumbar spine surgery.  Patient was frustrated with his chronic pain symptoms along with a resulting disability.  Today I took the time to provide the patient with information regarding my pain practice. The patient was informed that my practice is divided into two sections: an interventional pain management section, as well as a completely separate and distinct medication management section. I explained that I have procedure days for my interventional therapies, and evaluation days for follow-ups and medication management. Because of the amount of documentation required during both, they are kept separated. This means that there is the possibility that he may be scheduled for a procedure on one day, and medication management the next. I have also informed him that because of staffing and facility limitations, I no longer take patients for medication management only. To illustrate the reasons for this, I gave the patient the example of surgeons, and how  inappropriate it would be to refer a patient to his/her care, just to write for the post-surgical antibiotics on a surgery done by a different surgeon.   Because interventional pain management is my board-certified specialty, the patient was informed that joining my practice means that they are open to any and all interventional therapies. I made it clear that this does not mean that they will be forced to have any procedures done. What this means is that I believe interventional therapies to be essential part of the diagnosis and proper management of chronic pain conditions. Therefore, patients not interested in these interventional alternatives will be better served under the care of a different practitioner.  The patient was also made aware of my Comprehensive Pain Management Safety Guidelines where by joining my practice, they limit all of their nerve blocks and joint injections to those done by our practice, for as long as we are retained to manage their care.   Historic Controlled Substance Pharmacotherapy Review  PMP and historical list of controlled substances: Hydrocodone 10 mg 4 times daily as needed, quantity 42-month recently increased from 7.5-10 mg.  Patient also on Xanax 0.5 mg twice daily as needed. MME/day: 40 mg/day Medications: The patient did not bring the medication(s) to the appointment, as requested in our "New Patient Package" Pharmacodynamics: Desired effects: Analgesia: The patient reports >50% benefit. Reported improvement in function: The patient reports medication allows him to accomplish basic ADLs. Clinically meaningful improvement in function (CMIF): Sustained CMIF goals met Perceived effectiveness: Described as relatively effective, allowing for increase in activities of daily living (ADL) Undesirable effects: Side-effects or Adverse reactions: None reported Historical Monitoring: The patient  reports that he does not use drugs. List of all UDS Test(s): No  results found for: MDMA, COCAINSCRNUR, [place]  PCPQUANT, CANNABQUANT, THCU, [place] List of all Serum Drug Screening Test(s):  No results found for: AMPHSCRSER, BARBSCRSER, BENZOSCRSER, COCAINSCRSER, PCPSCRSER, PCPQUANT, THCSCRSER, CANNABQUANT, OPIATESCRSER, OXYSCRSER, PROPOXSCRSER Historical Background Evaluation: [place] PMP: Six (6) year initial data search conducted.             PMP NARX Score Report:  Narcotic: 471 Sedative: 471 Stimulant: 0 [place] Department of public safety, offender search: Editor, commissioning Information) Non-contributory Risk Assessment Profile: Aberrant behavior: None observed or detected today Risk factors for fatal opioid overdose: concomitant use of Benzodiazepines and male gender PMP NARX Overdose Risk Score: 90 Fatal overdose hazard ratio (HR): Calculation deferred Non-fatal overdose hazard ratio (HR): Calculation deferred Risk of opioid abuse or dependence: 0.7-3.0% with doses ? 36 MME/day and 6.1-26% with doses ? 120 MME/day. Substance use disorder (SUD) risk level: Moderate Opioid risk tool (ORT) (Total Score):    ORT Scoring interpretation table:  Score <3 = Low Risk for SUD  Score between 4-7 = Moderate Risk for SUD  Score >8 = High Risk for Opioid Abuse   PHQ-2 Depression Scale:  Total score: 0  PHQ-2 Scoring interpretation table: (Score and probability of major depressive disorder)  Score 0 = No depression  Score 1 = 15.4% Probability  Score 2 = 21.1% Probability  Score 3 = 38.4% Probability  Score 4 = 45.5% Probability  Score 5 = 56.4% Probability  Score 6 = 78.6% Probability   PHQ-9 Depression Scale:  Total score: 0  PHQ-9 Scoring interpretation table:  Score 0-4 = No depression  Score 5-9 = Mild depression  Score 10-14 = Moderate depression  Score 15-19 = Moderately severe depression  Score 20-27 = Severe depression (2.4 times higher risk of SUD and 2.89 times higher risk of overuse)   Pharmacologic Plan: Continue therapy as is Mr. Fells has  indicated not being interested in our services, at this time. Initial impression: Continue me to medication management with Dr. Caryl Comes.  Meds   Current Outpatient Medications:  .  albuterol (VENTOLIN HFA) 108 (90 Base) MCG/ACT inhaler, INHALE 2 PUFFS BY MOUTH EVERY 6 HOURS AS NEEDED FOR WHEEZING, Disp: , Rfl:  .  ALPRAZolam (XANAX) 0.5 MG tablet, TAKE ONE TABLET BY MOUTH THREE TIMES DAILY AS NEEDED FOR ANXIETY, Disp: , Rfl:  .  aspirin 81 MG tablet, Take 81 mg daily by mouth., Disp: , Rfl:  .  atorvastatin (LIPITOR) 40 MG tablet, TAKE ONE TABLET BY MOUTH ONCE DAILY, Disp: , Rfl:  .  Cholecalciferol (VITAMIN D-1000 MAX ST) 1000 units tablet, Take 1 tablet by mouth 1 day or 1 dose., Disp: , Rfl:  .  clopidogrel (PLAVIX) 75 MG tablet, Take 75 mg by mouth daily. , Disp: , Rfl:  .  doxepin (SINEQUAN) 10 MG capsule, Take 10 mg by mouth., Disp: , Rfl:  .  DULoxetine (CYMBALTA) 30 MG capsule, Take 1 capsule by mouth daily. , Disp: , Rfl:  .  Fluticasone-Salmeterol (ADVAIR DISKUS) 500-50 MCG/DOSE AEPB, INHALE ONE DOSE BY MOUTH EVERY 12 HOURS, Disp: , Rfl:  .  furosemide (LASIX) 80 MG tablet, TAKE ONE TABLET BY MOUTH ONCE DAILY, Disp: , Rfl:  .  HYDROcodone-acetaminophen (NORCO) 10-325 MG tablet, Take 1 tablet every 6 (six) hours as needed by mouth., Disp: , Rfl:  .  magnesium oxide (MAG-OX) 400 MG tablet, Take 400 mg 2 (two) times daily by mouth., Disp: , Rfl:  .  metolazone (ZAROXOLYN) 5 MG tablet, Take 1 tablet by mouth as needed. , Disp: , Rfl:  .  metoprolol succinate (TOPROL-XL) 100 MG 24 hr tablet, TAKE ONE TABLET BY MOUTH ONCE DAILY, Disp: , Rfl:  .  nitroGLYCERIN (NITROSTAT) 0.4 MG SL tablet, Place 0.4 mg every 5 (five) minutes as needed under the tongue for chest pain., Disp: , Rfl:  .  pantoprazole (PROTONIX) 40 MG tablet, Take 40 mg by mouth 2 (two) times daily. , Disp: , Rfl:  .  potassium chloride SA (K-DUR,KLOR-CON) 20 MEQ tablet, Take 1 tablet by mouth 1 day or 1 dose., Disp: , Rfl:  .   primidone (MYSOLINE) 250 MG tablet, TAKE 1.5 TABLET BY twice daily, Disp: , Rfl:  .  promethazine (PHENERGAN) 25 MG tablet, Take 25 mg every 6 (six) hours as needed by mouth for nausea or vomiting., Disp: , Rfl:  .  QUEtiapine (SEROQUEL) 25 MG tablet, Take 25 mg by mouth at bedtime., Disp: , Rfl:  .  sucralfate (CARAFATE) 1 g tablet, Take 1 g by mouth 2 (two) times daily. , Disp: , Rfl:  .  tamsulosin (FLOMAX) 0.4 MG CAPS capsule, TAKE ONE CAPSULE BY MOUTH ONCE DAILY 30 MINUTES AFTER THE SAME MEAL EACH DAY, Disp: , Rfl:  .  theophylline (UNIPHYL) 400 MG 24 hr tablet, Take 1 tablet by mouth daily. , Disp: , Rfl:  .  tiotropium (SPIRIVA HANDIHALER) 18 MCG inhalation capsule, INHALE ONE DOSE BY MOUTH ONCE DAILY, Disp: , Rfl:  .  vitamin B-12 (CYANOCOBALAMIN) 1000 MCG tablet, Take 1,000 mcg by mouth daily., Disp: , Rfl:  .  acetaminophen (TYLENOL) 325 MG tablet, Take 2 tablets (650 mg total) by mouth every 4 (four) hours as needed for mild pain (temp > 101.5). (Patient not taking: Reported on 10/22/2017), Disp: , Rfl:  .  albuterol (PROVENTIL HFA;VENTOLIN HFA) 108 (90 Base) MCG/ACT inhaler, Inhale into the lungs., Disp: , Rfl:  .  benzonatate (TESSALON) 100 MG capsule, Take 200 mg by mouth 3 (three) times daily as needed for cough., Disp: , Rfl:  .  diphenhydramine-acetaminophen (TYLENOL PM) 25-500 MG TABS tablet, Take 1 tablet by mouth at bedtime as needed., Disp: , Rfl:  .  fluticasone furoate-vilanterol (BREO ELLIPTA) 100-25 MCG/INH AEPB, Inhale 1 puff into the lungs daily., Disp: , Rfl:  .  gabapentin (NEURONTIN) 300 MG capsule, Take 600 mg by mouth at bedtime. , Disp: , Rfl:  .  HYDROcodone-acetaminophen (NORCO) 7.5-325 MG tablet, Take 1 tablet by mouth every 6 (six) hours as needed., Disp: , Rfl:  .  predniSONE (STERAPRED UNI-PAK 21 TAB) 10 MG (21) TBPK tablet, Take 1 tablet (10 mg total) by mouth daily. Take 6 tablets by mouth for 1 day followed by  5 tablets by mouth for 1 day followed by  4  tablets by mouth for 1 day followed by  3 tablets by mouth for 1 day followed by  2 tablets by mouth for 1 day followed by  1 tablet by mouth for a day and stop (Patient not taking: Reported on 03/02/2017), Disp: 21 tablet, Rfl: 0 .  saccharomyces boulardii (FLORASTOR) 250 MG capsule, Take 1 capsule (250 mg total) by mouth 2 (two) times daily., Disp: 30 capsule, Rfl: 0 .  SYMBICORT 160-4.5 MCG/ACT inhaler, , Disp: , Rfl:  .  torsemide (DEMADEX) 20 MG tablet, Take by mouth., Disp: , Rfl:   Imaging Review   Lumbar CT wo contrast:  Results for orders placed during the hospital encounter of 08/19/11  CT Lumbar Spine Wo Contrast   Narrative *RADIOLOGY REPORT*  Clinical Data: 66 year old male  with low back pain, prior lumbar fusion.  Spondylolisthesis.  CT LUMBAR SPINE WITHOUT CONTRAST  Technique:  Multidetector CT imaging of the lumbar spine was performed without intravenous contrast administration. Multiplanar CT image reconstructions were also generated.  Comparison: CT lumbar myelogram 01/07/2011.  Intraoperative radiographs 04/24/2011.  Findings: Motion artifact in the costophrenic sulci.  Intermittent hardware streak artifact.  Extensive calcified atherosclerosis of the aorta and visualized iliac arteries.  There is ectasia of the infrarenal aorta, incompletely visualized.  AP diameter of the aorta at that level probably exceeds 3 mm (series 4 image 30).  Osteopenia.  Visualized sacrum is intact.  Negative SI joints except for vacuum phenomena.  Stable vertebral height and alignment.  Chronic T12 wedge deformity.  Cephalad extension of lumbar fusion to L2 with decompression, see below details.  No other acute osseous abnormality.  T11-T12:  Stable anterior disc osteophyte complex.  No significant stenosis.  T12-L1:  Stable mild circumferential disc osteophyte complex. Stable mild to moderate facet hypertrophy.  No significant stenosis.  L1-L2:  Increase vacuum disc. Mildly  increased circumferential disc osteophyte complex. New right paracentral disc extrusion with cephalad migration suspected (sagittal image 36).  Stable mild to moderate facet hypertrophy.  AP thecal sac has diminished to 9 mm (previously 12 mm). Right lateral recess stenosis suspected. Exiting right L1 nerve might be affected by disc.  L2-L3:  New transpedicular hardware.  There is lucency at the tip of the left L2 pedicle screw (series 3 image 33).  Interbody implant placed.  There has been incorporation into the end plate. This level has been decompressed.  Posterior bone graft material. I suspect there is early interbody arthrodesis.  No definite posterior element arthrodesis. Streak artifact.  Thecal sac now appears widely patent.  Stable mild osseous foraminal stenosis on the right.  L3-L4:  New transpedicular hardware with no adverse features.  No interbody implant.  Incorporation into the end plates and to a suspect early interbody arthrodesis.  The level is been decompressed.  No definite posterior element arthrodesis.  Streak artifact.  Thecal sac now appears widely patent.  Stable osseous foraminal stenosis on the right.  L4-L5:  Preexisting anterior fusion hardware with solid arthrodesis.  Stable posterior element hypertrophy.  Stable osseous foraminal stenosis, more so on the left.  Stable thecal sac patency.  L5-S1:  Stable right eccentric disc bulge.  Stable moderate facet hypertrophy greater on the left.  No spinal stenosis.  Stable mild bilateral L5 foraminal stenosis.  IMPRESSION: 1.  Adjacent segment disease at L1-L2 with right paracentral and cephalad disc extrusion suspected (series 201 image 36), correlate for right L1 or L2 radiculitis.  Multifactorial mild spinal stenosis at this level. 2.  Fusion and decompression at L2-L3 and L3-L4 with improved thecal sac patency at both levels.  Early interbody arthrodesis suspected at both levels, however, there is  some lucency about the anterior left L2 pedicle screw.  Attention directed on follow-up. 3.  Stable preexisting L4-L5 fusion with solid arthrodesis.  Stable comparatively mild degenerative changes at L5-S1. 4.  Moderate suspicion of infrarenal abdominal aortic aneurysm, never fully visualized on these spine exams.  Abdominal aorta ultrasound would be a reasonable initial step for further investigation (while CTA of the abdomen would provide the most thorough evaluation).  Original Report Authenticated By: Randall An, M.D.   Lumbar CT w contrast:  Results for orders placed during the hospital encounter of 01/07/11  CT Lumbar Spine W Contrast   Narrative Clinical Data:  Lumbar  spondylosis.  Lumbar fusion   CT MYELOGRAPHY LUMBAR SPINE   Technique:  CT imaging of the lumbar spine was performed after intrathecal contrast administration.  Multiplanar CT image reconstructions were also generated.   Comparison:  CT myelogram 12/04/2003   Findings:  Mild to moderate levoscoliosis at L3-4 has progressed in the interval.  Chronic compression fracture T12 is unchanged from the prior study.  No acute fracture or mass lesion.  Anterior plate and screw fusion at L4-5 has been performed since the prior study. This appears to be a solid fusion.  Atherosclerotic disease in the aorta measuring up to 28 mm in diameter.   T11-T12:  Mild disc degeneration without stenosis.   T12-L1:  Mild disc degeneration and spurring without significant stenosis.   L1-2:  Disc degeneration with Schmorl's node.  Mild disc bulging and mild spinal stenosis.   L2-3:  Disc degeneration with diffuse bulging of the disc.  In addition there is  posterior vertebral endplate spurring.  Moderate facet and ligamentum flavum hypertrophy.  Moderate spinal stenosis is present.  Right foraminal encroachment due to spurring.   L3-4:  Severe spinal stenosis due to diffuse bulging of the disc which has progressed in the  interval.  There is moderate facet and ligamentum flavum hypertrophy also contributing to stenosis. Moderate foraminal encroachment on the right due to vertebral spurring and disc bulging.   L4-5:  Anterior plate and screw fusion.  Solid interbody bone fusion.  No significant spinal stenosis.   L5-S1:  Mild disc bulging without stenosis or disc protrusion.   IMPRESSION: Mild spinal stenosis at L1-2.  Moderate stenosis at L2-3.   Severe spinal stenosis L3-4 due to diffuse bulging of the disc and posterior element hypertrophy.   Solid fusion L4-5.   Atherosclerotic aorta.  Provider: Hubbard Robinson    Lumbar DG 2-3 views:  Results for orders placed during the hospital encounter of 04/24/11  DG Lumbar Spine 2-3 Views   Narrative *RADIOLOGY REPORT*  Clinical Data: L2-3, L3-4 posterior fusion.  LUMBAR SPINE - 2-3 VIEW  Comparison: 04/24/2011  Findings: Two intraoperative spot images demonstrate changes of remote L4-5 anterior fusion.  Posterior fusion changes are noted L2- L4.  Normal alignment.  IMPRESSION: L2-L4 posterior fusion.  Original Report Authenticated By: Raelyn Number, M.D.   Lumbar DG Myelogram views:  Results for orders placed during the hospital encounter of 01/07/11  DG Myelogram Lumbar   Narrative Clinical Data:  Lumbar spondylosis.  Lumbar fusion.   MYELOGRAM LUMBAR   Technique: Lumbar puncture and injection Omnipaque contrast by Dr. Ellene Route. Following injection of intrathecal Omnipaque contrast, spine imaging in multiple projections was performed using fluoroscopy.   Fluoroscopy Time: 1.7 minutes.   Comparison:  CT myelogram 12/04/2003   Findings: Prior anterior plate fusion W2-9.  Mild to moderate levoscoliosis at L3.   High-grade stenosis at L3-4.  Eventually contrast did pass below this level but for a  while there was a block of contrast.  This is due to disc bulging and spurring.  There is also disc bulging and mild stenosis at L1-2 and  L2-3.  L5-S1 is intact.  L4-5 shows solid bony fusion without stenosis.   IMPRESSION: Solid anterior fusion L4-5.   Severe spinal stenosis at L3-4.  Mild stenosis L1-2 and L2-3.  Provider: Tally Due    CT-Guided Biopsy:  Results for orders placed during the hospital encounter of 12/24/16  CT Biopsy   Narrative CLINICAL DATA:  Right upper lobe lung nodule demonstrating abnormal  hypermetabolism by PET scan. The patient is not a surgical candidate for operative resection and requires percutaneous biopsy for tissue diagnosis prior to planned stereotactic radiation therapy.  EXAM: CT GUIDED CORE BIOPSY OF RIGHT LUNG NODULE  ANESTHESIA/SEDATION: 3.0 mg IV Versed; 150 mcg IV Fentanyl  Total Moderate Sedation Time:  49 minutes.  The patient's level of consciousness and physiologic status were continuously monitored during the procedure by Radiology nursing.  PROCEDURE: The procedure risks, benefits, and alternatives were explained to the patient. Questions regarding the procedure were encouraged and answered. The patient understands and consents to the procedure. A time-out was performed prior to initiating the procedure.  The right chest wall was prepped with chlorhexidine in a sterile fashion, and a sterile drape was applied covering the operative field. A sterile gown and sterile gloves were used for the procedure. Local anesthesia was provided with 1% Lidocaine.  CT was performed in a supine position to localize a right upper lobe lung nodule. Under CT guidance, an anterolateral approach was initially taken for advancement of a 17 gauge trocar needle to the level of a right upper lobe lung nodule. Under CT fluoroscopic guidance, the needle was advanced and repositioned multiple times.  A more anterior approach was then chosen for biopsy and a longer 17 gauge needle advanced under CT guidance. After positioning the needle along the anterior margin of the right upper  lobe nodule, a coaxial 18 gauge core biopsy sample was obtained.  The Biosentry device was utilized in deploying a plug at the pleural entry site. Post biopsy CT images were then performed.  COMPLICATIONS: Tiny right pneumothorax following the procedure. SIR level A: No therapy, no consequence.  FINDINGS: Lateral right upper lobe lung nodule again identified. Solid portion measures approximately 12 mm in diameter. From an initial lateral approach, biopsy was unsuccessful due to persistently overlying rib which prevented accurate positioning of a needle to the level of the nodule.  Successful biopsy was able to be performed from a more direct anterior approach. A solid core biopsy sample was obtained from the nodule. Post biopsy imaging shows a small pneumothorax adjacent to the nodule on completion. This will be followed by chest x-ray during recovery.  IMPRESSION: CT-guided core biopsy performed of a 12 mm right upper lobe lung nodule. The procedure was complicated by a small adjacent right pneumothorax not requiring immediate additional intervention. This will be followed by chest x-ray during recovery.   Electronically Signed   By: Aletta Edouard M.D.   On: 12/24/2016 11:07    She still has tingling in her feet Complexity Note: Imaging results reviewed. Results shared with Mr. Pascua, using Layman's terms.                         ROS  Cardiovascular History: Heart trouble, Daily Aspirin intake, Heart attack ( Date: 1997), Heart surgery, Heart catheterization and Blood thinners:  Anticoagulant Pulmonary or Respiratory History: Lung problems and Shortness of breath Neurological History: No reported neurological signs or symptoms such as seizures, abnormal skin sensations, urinary and/or fecal incontinence, being born with an abnormal open spine and/or a tethered spinal cord Review of Past Neurological Studies: No results found for this or any previous  visit. Psychological-Psychiatric History: No reported psychological or psychiatric signs or symptoms such as difficulty sleeping, anxiety, depression, delusions or hallucinations (schizophrenial), mood swings (bipolar disorders) or suicidal ideations or attempts Gastrointestinal History: Vomiting blood (Ulcers) Genitourinary History: No reported renal or genitourinary signs  or symptoms such as difficulty voiding or producing urine, peeing blood, non-functioning kidney, kidney stones, difficulty emptying the bladder, difficulty controlling the flow of urine, or chronic kidney disease Hematological History: Brusing easily and Bleeding easily Endocrine History: No reported endocrine signs or symptoms such as high or low blood sugar, rapid heart rate due to high thyroid levels, obesity or weight gain due to slow thyroid or thyroid disease Rheumatologic History: No reported rheumatological signs and symptoms such as fatigue, joint pain, tenderness, swelling, redness, heat, stiffness, decreased range of motion, with or without associated rash Musculoskeletal History: Negative for myasthenia gravis, muscular dystrophy, multiple sclerosis or malignant hyperthermia Work History: Retired  Allergies  Mr. Garcon is allergic to zolpidem; belladonna alkaloids; and sulfa antibiotics.  Laboratory Chemistry  Inflammation Markers (CRP: Acute Phase) (ESR: Chronic Phase) Lab Results  Component Value Date   CRP 14.2 (H) 04/28/2011   ESRSEDRATE 70 (H) 04/28/2011                 Renal Function Markers Lab Results  Component Value Date   BUN 14 01/13/2017   CREATININE 0.63 01/13/2017   GFRAA >60 01/13/2017   GFRNONAA >60 01/13/2017                 Hepatic Function Markers Lab Results  Component Value Date   AST 26 02/28/2012   ALT 36 02/28/2012   ALBUMIN 3.6 02/28/2012   ALKPHOS 60 02/28/2012                 Electrolytes Lab Results  Component Value Date   NA 132 (L) 01/13/2017   K 4.1 01/13/2017    CL 95 (L) 01/13/2017   CALCIUM 8.8 (L) 01/13/2017   MG 2.0 09/14/2014                 Neuropathy Markers Lab Results  Component Value Date   PJKDTOIZ12 4580 (H) 04/29/2011                 Bone Pathology Markers Lab Results  Component Value Date   ALKPHOS 60 02/28/2012   CALCIUM 8.8 (L) 01/13/2017                 Rheumatology Markers No results found for: LABURIC              Coagulation Parameters Lab Results  Component Value Date   INR 0.94 12/24/2016   LABPROT 12.6 12/24/2016   APTT 29 12/24/2016   PLT 278 01/13/2017                 Cardiovascular Markers Lab Results  Component Value Date   BNP 45.0 01/11/2017   CKMB 1.0 09/12/2014   TROPONINI <0.03 01/11/2017   HGB 12.4 (L) 01/13/2017   HCT 35.3 (L) 01/13/2017                 CA Markers No results found for: CEA, CA125               Note: Lab results reviewed.  Rafael Gonzalez  Drug: Mr. Gonzaga  reports that he does not use drugs. Alcohol:  reports that he does not drink alcohol. Tobacco:  reports that he has quit smoking. His smoking use included cigarettes. He has a 49.00 pack-year smoking history. he has never used smokeless tobacco. Medical:  has a past medical history of Anxiety, Arthritis, Cancer ([place]), COPD (chronic obstructive pulmonary disease) ([place]), Dyspnea, Emphysema of lung ([place]), GERD (gastroesophageal reflux disease), Hypertension, Myocardial  infarction [facility]), and Tremors of nervous system. Family: family history includes Congestive Heart Failure in his father and mother.  Past Surgical History:  Procedure Laterality Date  . BACK SURGERY    . CORONARY ANGIOPLASTY    . STENT REMOVAL     Active Ambulatory Problems    Diagnosis Date Noted  . Pulmonary nodules 07/08/2016  . Abnormal CT of the chest 07/08/2016  . Hemoptysis 07/08/2016  . Pneumonia 07/08/2016  . Anemia 11/25/2016  . Benign essential tremor 07/04/2014  . CAD (coronary artery disease) 04/27/2012  . COPD (chronic obstructive  pulmonary disease) ([place]) 11/25/2016  . Dyslipidemia 04/27/2012  . GERD (gastroesophageal reflux disease) 11/25/2016  . HTN (hypertension) 04/27/2012  . Peripheral polyneuropathy 02/06/2015  . PUD (peptic ulcer disease) 04/27/2012  . Recurrent major depressive disorder, in partial remission ([place]) 01/09/2016  . Senile purpura ([place]) 03/19/2016  . Spinal stenosis 04/27/2012  . Vitamin B12 deficiency 09/17/2016  . Acute exacerbation of chronic obstructive pulmonary disease (COPD) ([place]) 01/11/2017  . CAP (community acquired pneumonia) 01/11/2017  . Cancer of upper lobe of right lung ([place]) 03/02/2017   Resolved Ambulatory Problems    Diagnosis Date Noted  . No Resolved Ambulatory Problems   Past Medical History:  Diagnosis Date  . Anxiety   . Arthritis   . Cancer ([place])   . COPD (chronic obstructive pulmonary disease) ([place])   . Dyspnea   . Emphysema of lung ([place])   . GERD (gastroesophageal reflux disease)   . Hypertension   . Myocardial infarction ([place])   . Tremors of nervous system    Constitutional Exam  General appearance: alert, cooperative and slowed mentation Vitals:   10/22/17 0856  BP: 114/76  Pulse: 97  Resp: 16  Temp: 98.2 F (36.8 C)  TempSrc: Oral  SpO2: 96%  Weight: 137 lb (62.1 kg)  Height: _0  (1.702 m)   BMI Assessment: Estimated body mass index is 21.46 kg/m as calculated from the following:   Height as of this encounter: _1  (1.702 m).   Weight as of this encounter: 137 lb (62.1 kg).  BMI interpretation table: BMI level Category Range association with higher incidence of chronic pain  <18 kg/m2 Underweight   18.5-24.9 kg/m2 Ideal body weight   25-29.9 kg/m2 Overweight Increased incidence by 20%  30-34.9 kg/m2 Obese (Class I) Increased incidence by 68%  35-39.9 kg/m2 Severe obesity (Class II) Increased incidence by 136%  >40 kg/m2 Extreme obesity (Class III) Increased incidence by 254%   BMI Readings from Last 4 Encounters:  10/22/17 21.46  kg/m  06/08/17 24.52 kg/m  03/02/17 24.09 kg/m  01/13/17 23.29 kg/m   Wt Readings from Last 4 Encounters:  10/22/17 137 lb (62.1 kg)  06/08/17 151 lb 14.4 oz (68.9 kg)  03/02/17 149 lb 4 oz (67.7 kg)  01/13/17 144 lb 4.8 oz (65.5 kg)  Psych/Mental status: Alert, oriented x 3 (person, place, & time)       Eyes: PERLA Respiratory: No evidence of acute respiratory distress  Cervical Spine Area Exam  Skin & Axial Inspection: No masses, redness, edema, swelling, or associated skin lesions Alignment: Symmetrical Functional ROM: Decreased ROM, bilaterally Stability: No instability detected Muscle Tone/Strength: Functionally intact. No obvious neuro-muscular anomalies detected. Sensory (Neurological): Articular pain pattern Palpation: Complains of area being tender to palpation            Overlying cervical facets  Upper Extremity (UE) Exam    Side: Right upper extremity  Side: Left upper extremity  Skin & Extremity Inspection: Skin color, temperature, and hair growth are WNL. No peripheral edema or cyanosis. No masses, redness, swelling, asymmetry, or associated skin lesions. No contractures.  Skin & Extremity Inspection: Skin color, temperature, and hair growth are WNL. No peripheral edema or cyanosis. No masses, redness, swelling, asymmetry, or associated skin lesions. No contractures.  Functional ROM: Unrestricted ROM          Functional ROM: Unrestricted ROM          Muscle Tone/Strength: Functionally intact. No obvious neuro-muscular anomalies detected.  Muscle Tone/Strength: Functionally intact. No obvious neuro-muscular anomalies detected.  Sensory (Neurological): Unimpaired          Sensory (Neurological): Unimpaired          Palpation: No palpable anomalies              Palpation: No palpable anomalies              Specialized Test(s): Deferred         Specialized Test(s): Deferred          Thoracic Spine Area Exam  Skin & Axial Inspection: Well healed scar from previous spine  surgery detected Alignment: Asymmetric Functional ROM: Decreased ROM Stability: No instability detected Muscle Tone/Strength: Functionally intact. No obvious neuro-muscular anomalies detected. Sensory (Neurological): Musculoskeletal pain pattern Muscle strength & Tone: Complains of area being tender to palpation  Lumbar Spine Area Exam  Skin & Axial Inspection: No masses, redness, or swelling Alignment: Asymmetric Functional ROM: Decreased ROM, bilaterally and lumbar extension Stability: No instability detected Muscle Tone/Strength: Functionally intact. No obvious neuro-muscular anomalies detected. Sensory (Neurological): Musculoskeletal pain pattern Palpation: Complains of area being tender to palpation Bilateral Fist Percussion Test Provocative Tests: Lumbar Hyperextension and rotation test: Positive bilaterally for facet joint pain. Lumbar Lateral bending test: Positive due to pain.and lumbar fusion Patrick's Maneuver: Positive for bilateral S-I arthralgia              Gait & Posture Assessment  Ambulation: Limited Gait: Antalgic Posture: Difficulty standing up straight, due to pain   Lower Extremity Exam    Side: Right lower extremity  Side: Left lower extremity  Skin & Extremity Inspection: Skin color, temperature, and hair growth are WNL. No peripheral edema or cyanosis. No masses, redness, swelling, asymmetry, or associated skin lesions. No contractures.  Skin & Extremity Inspection: Skin color, temperature, and hair growth are WNL. No peripheral edema or cyanosis. No masses, redness, swelling, asymmetry, or associated skin lesions. No contractures.  Functional ROM: Unrestricted ROM          Functional ROM: Unrestricted ROM          Muscle Tone/Strength: Functionally intact. No obvious neuro-muscular anomalies detected.  Muscle Tone/Strength: Functionally intact. No obvious neuro-muscular anomalies detected.  Sensory (Neurological): Unimpaired  Sensory (Neurological): Unimpaired   Palpation: No palpable anomalies  Palpation: No palpable anomalies  4 out of 5 strength bilateral lower extremity: Plantar flexion, dorsiflexion, knee flexion, knee extension.  Assessment  Primary Diagnosis & Pertinent Problem List: The primary encounter diagnosis was Spinal stenosis of lumbar region with neurogenic claudication. Diagnoses of History of lumbar spinal fusion, Chronic pain syndrome, Peripheral polyneuropathy, and Cancer of upper lobe of right lung [facility]) were also pertinent to this visit.  Visit Diagnosis (New problems to examiner): 1. Spinal stenosis of lumbar region with neurogenic claudication   2. History of lumbar spinal fusion   3. Chronic pain syndrome   4. Peripheral polyneuropathy   5. Cancer of upper lobe  of right lung ([place])    66 year old male with a history of lumbar spine surgery, L1-L4 posterior lumbar interbody fusion followed by L4-L5 anterior fusion presents with axial low back pain that radiates to bilateral legs.  Patient also has a history of lung cancer status post radiation therapy and has symptoms of intercostal neuralgia, lower extremity peripheral neuropathy along with diffuse osteoarthritis along multiple joints.  An extensive discussion with the patient about his therapeutic options.  I am particularly concerned about the patient's polypharmacy.  Given his cardiovascular disease and COPD, I do not recommend concomitant benzodiazepine and opioid therapy.  I have recommended the patient wean off Xanax for find other alternatives with the help of his primary care physician or mental health provider.  In regards to his opioid medications, it is reasonable to continue hydrocodone 10 mg as prescribed.  While the patient may benefit from additional non-opioid analgesics such as muscle relaxants, anti-inflammatories, membrane stabilizers, I believe that adding additional medication will only result in confusion and will increase the risk of side effects.  For that  reason, I discussed various interventional therapies that I could offer the patient.  We discussed performing a diagnostic lumbar facet block at L5-S1 and also doing a diagnostic bilateral sacroiliac joint injection given the patient's chronic lumbosacral and buttock pain that is worse with lumbar extension along with positive Patrick's maneuver to suggest SI joint arthralgia.  Patient is on Plavix for his cardiovascular disease coronary artery disease first to do procedure safely, he will need to be off of Plavix for 7 days.  At this point the patient wants to hold off on the procedure and will contact us in the future if he is interested.  Plan: -Continue medication management with primary care physician -Discussed and offered bilateral lumbar L5-S1 diagnostic facet blocks for facet hypertrophy present on lumbar MRI consistent with physical exam along with bilateral sacroiliac joint injections for SI joint arthralgia (positive Patrick's) -Concern for polypharmacy, recommend patient discontinue any medications that are not therapeutic as it could be contributing to side effects and pill burden. -If interested in lumbosacral facet block and SI joint injections, patient must be off Plavix for 7 days prior to procedure. -Follow-up as needed for interventional therapy.    Provider-requested follow-up: Return if symptoms worsen or fail to improve.  Future Appointments  Date Time Provider [place]  12/16/2017  9:00 AM [facility]  12/21/2017  9:00 AM Noreene Filbert, MD [facility] None    Primary Care Physician: Adin Hector, MD Location: [facility] Note by: Gillis Santa, M.D, Date: 10/22/2017; Time: 3:14 PM  There are no Patient Instructions on file for this visit.

## 2017-12-16 ENCOUNTER — Ambulatory Visit
Admission: RE | Admit: 2017-12-16 | Discharge: 2017-12-16 | Disposition: A | Discharge status: Home / Self Care | Payer: Medicare Other | Source: Ambulatory Visit | Attending: Radiation Oncology | Admitting: Radiation Oncology

## 2017-12-16 DIAGNOSIS — J432 Centrilobular emphysema: Secondary | ICD-10-CM | POA: Insufficient documentation

## 2017-12-16 DIAGNOSIS — I2510 Atherosclerotic heart disease of native coronary artery without angina pectoris: Secondary | ICD-10-CM | POA: Insufficient documentation

## 2017-12-16 DIAGNOSIS — I700 Atherosclerosis of aorta: Secondary | ICD-10-CM | POA: Diagnosis not present

## 2017-12-16 DIAGNOSIS — C3411 Malignant neoplasm of upper lobe, right bronchus or lung: Secondary | ICD-10-CM | POA: Diagnosis not present

## 2017-12-16 DIAGNOSIS — R911 Solitary pulmonary nodule: Secondary | ICD-10-CM | POA: Diagnosis not present

## 2017-12-16 LAB — POCT I-STAT CREATININE: Creatinine, Ser: 1.2 mg/dL (ref 0.61–1.24)

## 2017-12-16 MED ORDER — IOPAMIDOL (ISOVUE-300) INJECTION 61%
75.0000 mL | Freq: Once | INTRAVENOUS | Status: AC | PRN
  Administered 2017-12-16: 75 mL via INTRAVENOUS

## 2017-12-21 ENCOUNTER — Encounter: Payer: Self-pay | Admitting: Radiation Oncology

## 2017-12-21 ENCOUNTER — Other Ambulatory Visit: Payer: Self-pay

## 2017-12-21 ENCOUNTER — Ambulatory Visit
Admission: RE | Admit: 2017-12-21 | Discharge: 2017-12-21 | Disposition: A | Discharge status: Home / Self Care | Payer: Medicare Other | Source: Ambulatory Visit | Attending: Radiation Oncology | Admitting: Radiation Oncology

## 2017-12-21 ENCOUNTER — Other Ambulatory Visit: Payer: Self-pay | Admitting: *Deleted

## 2017-12-21 VITALS — BP 128/86 | HR 103 | Temp 97.0°F | Resp 20 | Wt 138.2 lb

## 2017-12-21 DIAGNOSIS — Z923 Personal history of irradiation: Secondary | ICD-10-CM | POA: Diagnosis not present

## 2017-12-21 DIAGNOSIS — Z87891 Personal history of nicotine dependence: Secondary | ICD-10-CM | POA: Diagnosis not present

## 2017-12-21 DIAGNOSIS — R918 Other nonspecific abnormal finding of lung field: Secondary | ICD-10-CM | POA: Diagnosis not present

## 2017-12-21 DIAGNOSIS — C3411 Malignant neoplasm of upper lobe, right bronchus or lung: Secondary | ICD-10-CM

## 2017-12-21 DIAGNOSIS — Z9181 History of falling: Secondary | ICD-10-CM | POA: Diagnosis not present

## 2017-12-21 NOTE — Progress Notes (Signed)
Radiation Oncology Follow up Note  Name: Ryan Jarvis   Date:   12/21/2017 MRN:  [id number] DOB: [birth year]    This 67 y.o. male presents to the clinic today for 10 month follow-up status post SB RT to his right upper lobe for adenocarcinoma.  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 He is seen today in routine follow up is doing fair. He states he is had a significant fall recently causing abrasions on his back and some rib discomfort. From our standpoint he specifically denies cough hemoptysis chest tightness early changes pulmonary status.Marland Kitchen He did recently had a CT scan which I have reviewed showing 2 new small 4 mm pulmonary nodules in the right upper right lower lobes with follow-up in 3 months recommended. The treated peripheral right upper lobe pulmonary nodule is stable.  COMPLICATIONS OF TREATMENT: none  FOLLOW UP COMPLIANCE: keeps appointments   PHYSICAL EXAM:  BP 128/86   Pulse (!) 103   Temp (!) 97 F (36.1 C)   Resp 20   Wt 138 lb 3.7 oz (62.7 kg)   BMI 21.65 kg/m  Well-developed well-nourished patient in NAD. HEENT reveals PERLA, EOMI, discs not visualized.  Oral cavity is clear. No oral mucosal lesions are identified. Neck is clear without evidence of cervical or supraclavicular adenopathy. Lungs are clear to A&P. Cardiac examination is essentially unremarkable with regular rate and rhythm without murmur rub or thrill. Abdomen is benign with no organomegaly or masses noted. Motor sensory and DTR levels are equal and symmetric in the upper and lower extremities. Cranial nerves II through XII are grossly intact. Proprioception is intact. No peripheral adenopathy or edema is identified. No motor or sensory levels are noted. Crude visual fields are within normal range.  RADIOLOGY RESULTS: CT scan reviewed and compatible with the above-stated  findings  PLAN: At this time of asked to see him back in 4 months and we'll reevaluate his CT scan which had been recently performed. Not sure I'm not concerned about these subcentimeter nodules in his chest but will reevaluate him at any time. Should they show significant growth may be a option for further SB RT. All this was explained to the patient and his family. Follow-up appointment was arranged. Patient knows to call with any concerns.  I would like to take this opportunity to thank you for allowing me to participate in the care of your patient.Noreene Filbert, MD

## 2018-02-16 DIAGNOSIS — Z79899 Other long term (current) drug therapy: Secondary | ICD-10-CM | POA: Insufficient documentation

## 2018-04-12 ENCOUNTER — Ambulatory Visit
Admission: RE | Admit: 2018-04-12 | Discharge: 2018-04-12 | Disposition: A | Discharge status: Home / Self Care | Payer: Medicare Other | Source: Ambulatory Visit | Attending: Radiation Oncology | Admitting: Radiation Oncology

## 2018-04-12 DIAGNOSIS — X58XXXA Exposure to other specified factors, initial encounter: Secondary | ICD-10-CM | POA: Insufficient documentation

## 2018-04-12 DIAGNOSIS — M8580 Other specified disorders of bone density and structure, unspecified site: Secondary | ICD-10-CM | POA: Diagnosis not present

## 2018-04-12 DIAGNOSIS — S2243XA Multiple fractures of ribs, bilateral, initial encounter for closed fracture: Secondary | ICD-10-CM | POA: Diagnosis not present

## 2018-04-12 DIAGNOSIS — S22059A Unspecified fracture of T5-T6 vertebra, initial encounter for closed fracture: Secondary | ICD-10-CM | POA: Insufficient documentation

## 2018-04-12 DIAGNOSIS — C3411 Malignant neoplasm of upper lobe, right bronchus or lung: Secondary | ICD-10-CM | POA: Insufficient documentation

## 2018-04-12 LAB — POCT I-STAT CREATININE: Creatinine, Ser: 1.2 mg/dL (ref 0.61–1.24)

## 2018-04-12 MED ORDER — IOPAMIDOL (ISOVUE-370) INJECTION 76%
75.0000 mL | Freq: Once | INTRAVENOUS | Status: AC | PRN
  Administered 2018-04-12: 75 mL via INTRAVENOUS

## 2018-04-16 DIAGNOSIS — R1319 Other dysphagia: Secondary | ICD-10-CM | POA: Insufficient documentation

## 2018-04-16 DIAGNOSIS — Z86010 Personal history of colonic polyps: Secondary | ICD-10-CM | POA: Insufficient documentation

## 2018-04-19 ENCOUNTER — Other Ambulatory Visit: Payer: Self-pay | Admitting: *Deleted

## 2018-04-19 ENCOUNTER — Other Ambulatory Visit: Payer: Self-pay

## 2018-04-19 ENCOUNTER — Encounter: Payer: Self-pay | Admitting: Radiation Oncology

## 2018-04-19 ENCOUNTER — Ambulatory Visit
Admission: RE | Admit: 2018-04-19 | Discharge: 2018-04-19 | Disposition: A | Discharge status: Home / Self Care | Payer: Medicare Other | Source: Ambulatory Visit | Attending: Radiation Oncology | Admitting: Radiation Oncology

## 2018-04-19 VITALS — BP 116/75 | HR 90 | Temp 96.2°F | Resp 20 | Wt 146.7 lb

## 2018-04-19 DIAGNOSIS — C3411 Malignant neoplasm of upper lobe, right bronchus or lung: Secondary | ICD-10-CM

## 2018-04-19 NOTE — Progress Notes (Signed)
Radiation Oncology Follow up Note  Name: Ryan Jarvis   Date:   04/19/2018 MRN:  [id number] DOB: Nov 15, 1951    This 67 y.o. male presents to the clinic today for four-month follow-up status post SB RT to the right upper lobe for adenocarcinoma stage I.  REFERRING PROVIDER: Adin Hector, MD  HPI: patient is a 67 year old male now out 4 months having completed SB RT to his right upper lobe for adenocarcinoma seen today in routine follow up is doing fair continue has problems with his breathing shortness of breath and some minor chest pain. He specifically denies cough hemoptysis.Marland Kitchenrecent CT scan showed right upper lobe pulmonary nodule greater in size although I assume this is radiation change and not progression of disease. He does have some other pulmonary nodules which are extremely small nonspecific. He also has some mild right hilar adenopathy.  COMPLICATIONS OF TREATMENT: none  FOLLOW UP COMPLIANCE: keeps appointments   PHYSICAL EXAM:  BP 116/75   Pulse 90   Temp (!) 96.2 F (35.7 C)   Resp 20   Wt 146 lb 11.5 oz (66.6 kg)   BMI 22.98 kg/m  Well-developed well-nourished patient in NAD. HEENT reveals PERLA, EOMI, discs not visualized.  Oral cavity is clear. No oral mucosal lesions are identified. Neck is clear without evidence of cervical or supraclavicular adenopathy. Lungs are clear to A&P. Cardiac examination is essentially unremarkable with regular rate and rhythm without murmur rub or thrill. Abdomen is benign with no organomegaly or masses noted. Motor sensory and DTR levels are equal and symmetric in the upper and lower extremities. Cranial nerves II through XII are grossly intact. Proprioception is intact. No peripheral adenopathy or edema is identified. No motor or sensory levels are noted. Crude visual fields are within normal range.  RADIOLOGY RESULTS: CT scan is reviewed and compatible with the above-stated findings  PLAN: present time patient is doing fairly well.  I'm please was overall progress. Would continue to observe and I would repeat his CT scan in about 6 months and see him shortly thereafter in follow-up. Patient knows to call at anytime with any concerns.the findings on his CT scan of the chest are very nonspecific and I believe can safely be followed with a 6 month follow-up CT scan.  I would like to take this opportunity to thank you for allowing me to participate in the care of your patient.Noreene Filbert, MD

## 2018-06-25 ENCOUNTER — Encounter: Payer: Self-pay | Admitting: *Deleted

## 2018-06-28 ENCOUNTER — Ambulatory Visit: Payer: Medicare Other | Admitting: Anesthesiology

## 2018-06-28 ENCOUNTER — Ambulatory Visit
Admission: RE | Admit: 2018-06-28 | Discharge: 2018-06-28 | Disposition: A | Discharge status: Home / Self Care | Payer: Medicare Other | Source: Ambulatory Visit | Attending: Unknown Physician Specialty | Admitting: Unknown Physician Specialty

## 2018-06-28 ENCOUNTER — Encounter
Admission: RE | Disposition: A | Discharge status: Home / Self Care | Payer: Self-pay | Source: Ambulatory Visit | Attending: Unknown Physician Specialty

## 2018-06-28 ENCOUNTER — Encounter: Payer: Self-pay | Admitting: Anesthesiology

## 2018-06-28 DIAGNOSIS — Z87891 Personal history of nicotine dependence: Secondary | ICD-10-CM | POA: Diagnosis not present

## 2018-06-28 DIAGNOSIS — Z79899 Other long term (current) drug therapy: Secondary | ICD-10-CM | POA: Diagnosis not present

## 2018-06-28 DIAGNOSIS — Z7982 Long term (current) use of aspirin: Secondary | ICD-10-CM | POA: Diagnosis not present

## 2018-06-28 DIAGNOSIS — K279 Peptic ulcer, site unspecified, unspecified as acute or chronic, without hemorrhage or perforation: Secondary | ICD-10-CM | POA: Insufficient documentation

## 2018-06-28 DIAGNOSIS — I2510 Atherosclerotic heart disease of native coronary artery without angina pectoris: Secondary | ICD-10-CM | POA: Diagnosis not present

## 2018-06-28 DIAGNOSIS — K640 First degree hemorrhoids: Secondary | ICD-10-CM | POA: Diagnosis not present

## 2018-06-28 DIAGNOSIS — K219 Gastro-esophageal reflux disease without esophagitis: Secondary | ICD-10-CM | POA: Insufficient documentation

## 2018-06-28 DIAGNOSIS — K621 Rectal polyp: Secondary | ICD-10-CM | POA: Insufficient documentation

## 2018-06-28 DIAGNOSIS — Z1211 Encounter for screening for malignant neoplasm of colon: Secondary | ICD-10-CM | POA: Insufficient documentation

## 2018-06-28 DIAGNOSIS — I252 Old myocardial infarction: Secondary | ICD-10-CM | POA: Insufficient documentation

## 2018-06-28 DIAGNOSIS — J449 Chronic obstructive pulmonary disease, unspecified: Secondary | ICD-10-CM | POA: Diagnosis not present

## 2018-06-28 HISTORY — DX: Peptic ulcer, site unspecified, unspecified as acute or chronic, without hemorrhage or perforation: K27.9

## 2018-06-28 HISTORY — DX: Thoracic aortic aneurysm, without rupture, unspecified: I71.20

## 2018-06-28 HISTORY — PX: ESOPHAGOGASTRODUODENOSCOPY (EGD) WITH PROPOFOL: SHX5813

## 2018-06-28 HISTORY — DX: Diaphragmatic hernia without obstruction or gangrene: K44.9

## 2018-06-28 HISTORY — DX: Thoracic aortic aneurysm, without rupture: I71.2

## 2018-06-28 HISTORY — DX: Cerebral infarction, unspecified: I63.9

## 2018-06-28 HISTORY — PX: COLONOSCOPY WITH PROPOFOL: SHX5780

## 2018-06-28 HISTORY — DX: Anemia, unspecified: D64.9

## 2018-06-28 HISTORY — DX: Deficiency of other specified B group vitamins: E53.8

## 2018-06-28 HISTORY — DX: Atherosclerotic heart disease of native coronary artery without angina pectoris: I25.10

## 2018-06-28 SURGERY — COLONOSCOPY WITH PROPOFOL
Anesthesia: General

## 2018-06-28 MED ORDER — PROPOFOL 500 MG/50ML IV EMUL
INTRAVENOUS | Status: AC
  Filled 2018-06-28: qty 50

## 2018-06-28 MED ORDER — PROPOFOL 10 MG/ML IV BOLUS
INTRAVENOUS | Status: DC | PRN
  Administered 2018-06-28 (×2): 20 mg via INTRAVENOUS

## 2018-06-28 MED ORDER — FENTANYL CITRATE (PF) 100 MCG/2ML IJ SOLN
INTRAMUSCULAR | Status: AC
  Filled 2018-06-28: qty 2

## 2018-06-28 MED ORDER — FENTANYL CITRATE (PF) 100 MCG/2ML IJ SOLN
INTRAMUSCULAR | Status: DC | PRN
  Administered 2018-06-28 (×2): 12.5 ug via INTRAVENOUS
  Administered 2018-06-28: 25 ug via INTRAVENOUS

## 2018-06-28 MED ORDER — MIDAZOLAM HCL 5 MG/5ML IJ SOLN
INTRAMUSCULAR | Status: DC | PRN
  Administered 2018-06-28: 2 mg via INTRAVENOUS

## 2018-06-28 MED ORDER — BUTAMBEN-TETRACAINE-BENZOCAINE 2-2-14 % EX AERO
INHALATION_SPRAY | CUTANEOUS | Status: DC | PRN
  Administered 2018-06-28 (×2): 1 via TOPICAL

## 2018-06-28 MED ORDER — SODIUM CHLORIDE 0.9 % IV SOLN: INTRAVENOUS | Status: DC

## 2018-06-28 MED ORDER — MIDAZOLAM HCL 2 MG/2ML IJ SOLN
INTRAMUSCULAR | Status: AC
  Filled 2018-06-28: qty 2

## 2018-06-28 MED ORDER — PROPOFOL 500 MG/50ML IV EMUL
INTRAVENOUS | Status: DC | PRN
  Administered 2018-06-28: 25 ug/kg/min via INTRAVENOUS

## 2018-06-28 MED ORDER — SODIUM CHLORIDE 0.9 % IV SOLN
INTRAVENOUS | Status: DC
  Administered 2018-06-28: 14:00:00 via INTRAVENOUS

## 2018-06-28 MED ORDER — LIDOCAINE HCL (PF) 2 % IJ SOLN
INTRAMUSCULAR | Status: DC | PRN
  Administered 2018-06-28: 60 mg

## 2018-06-28 MED ORDER — BUTAMBEN-TETRACAINE-BENZOCAINE 2-2-14 % EX AERO
INHALATION_SPRAY | CUTANEOUS | Status: AC
  Filled 2018-06-28: qty 5

## 2018-06-28 NOTE — Anesthesia Post-op Follow-up Note (Signed)
Anesthesia QCDR form completed.        

## 2018-06-28 NOTE — Transfer of Care (Signed)
Immediate Anesthesia Transfer of Care Note  Patient: Ryan Jarvis  Procedure(s) Performed: COLONOSCOPY WITH PROPOFOL (N/A ) ESOPHAGOGASTRODUODENOSCOPY (EGD) WITH PROPOFOL (N/A )  Patient Location: PACU  Anesthesia Type:General  Level of Consciousness: sedated  Airway & Oxygen Therapy: Patient Spontanous Breathing and Patient connected to nasal cannula oxygen  Post-op Assessment: Report given to RN and Post -op Vital signs reviewed and stable  Post vital signs: Reviewed and stable  Last Vitals:  Vitals Value Taken Time  BP 108/66 06/28/2018  2:40 PM  Temp    Pulse 85 06/28/2018  2:40 PM  Resp 13 06/28/2018  2:40 PM  SpO2 99 % 06/28/2018  2:40 PM  Vitals shown include unvalidated device data.  Last Pain:  Vitals:   06/28/18 1301  TempSrc: Tympanic         Complications: No apparent anesthesia complications

## 2018-06-28 NOTE — Anesthesia Preprocedure Evaluation (Signed)
Anesthesia Evaluation  Patient identified by MRN, date of birth, ID band Patient awake    Reviewed: Allergy & Precautions, H&P , NPO status , Patient's Chart, lab work & pertinent test results, reviewed documented beta blocker date and time   History of Anesthesia Complications Negative for: history of anesthetic complications  Airway Mallampati: II  TM Distance: >3 FB Neck ROM: full    Dental  (+) Partial Lower, Poor Dentition, Dental Advidsory Given, Missing   Pulmonary shortness of breath and with exertion, COPD,  COPD inhaler and oxygen dependent, neg recent URI, former smoker,           Cardiovascular Exercise Tolerance: Good hypertension, (-) angina+ CAD, + Past MI, + Cardiac Stents and + Peripheral Vascular Disease  (-) CABG negative cardio ROS  (-) dysrhythmias (-) Valvular Problems/Murmurs     Neuro/Psych PSYCHIATRIC DISORDERS Anxiety Depression negative neurological ROS     GI/Hepatic Neg liver ROS, hiatal hernia, PUD, GERD  ,  Endo/Other  negative endocrine ROS  Renal/GU negative Renal ROS  negative genitourinary   Musculoskeletal   Abdominal   Peds  Hematology negative hematology ROS (+)   Anesthesia Other Findings Past Medical History: No date: Anemia No date: Anxiety No date: Arthritis No date: Cancer ([place]) No date: COPD (chronic obstructive pulmonary disease) ([facility]) No date: Coronary artery disease     Comment:  04/1996 inferior MI. Cardiac cath; anterolateral and               inferior hypokinesis. The left main was normal and mid               LAD had a 50% lesion, D2 was 50-75%, mid circumflex was               95% adn there was a totally occluded RCA.  There was               distal filling of the RCA from br No date: Dyspnea No date: Emphysema of lung ([facility]) No date: GERD (gastroesophageal reflux disease) No date: Hiatal hernia No date: Hypertension No date: Myocardial infarction ([place]) No  date: PUD (peptic ulcer disease) No date: Stroke ([place]) No date: Thoracic aortic aneurysm without rupture ([facility]) No date: Tremors of nervous system No date: Vitamin B12 deficiency   Reproductive/Obstetrics negative OB ROS                             Anesthesia Physical Anesthesia Plan  ASA: III  Anesthesia Plan: General   Post-op Pain Management:    Induction: Intravenous  PONV Risk Score and Plan: 2 and Propofol infusion and TIVA  Airway Management Planned: Nasal Cannula  Additional Equipment:   Intra-op Plan:   Post-operative Plan:   Informed Consent: I have reviewed the patients History and Physical, chart, labs and discussed the procedure including the risks, benefits and alternatives for the proposed anesthesia with the patient or authorized representative who has indicated his/her understanding and acceptance.   Dental Advisory Given  Plan Discussed with: Anesthesiologist, CRNA and Surgeon  Anesthesia Plan Comments:         Anesthesia Quick Evaluation

## 2018-06-28 NOTE — Op Note (Signed)
[facility] Patient Name: Ryan Jarvis Procedure Date: 06/28/2018 1:48 PM MRN: [id number] Account #: [id number] Date of Birth: [birth year] Admit Type: Outpatient Age: 67 Room: [facility] ENDO ROOM 3 Gender: Male Note Status: Finalized Procedure:            Colonoscopy Indications:          High risk colon cancer surveillance: Personal history                        of colonic polyps Providers:            Manya Silvas, MD Referring MD:         Ramonita Lab, MD (Referring MD) Medicines:            Propofol per Anesthesia Complications:        No immediate complications. Procedure:            Pre-Anesthesia Assessment:                       - After reviewing the risks and benefits, the patient                        was deemed in satisfactory condition to undergo the                        procedure.                       After obtaining informed consent, the colonoscope was                        passed under direct vision. Throughout the procedure,                        the patient's blood pressure, pulse, and oxygen                        saturations were monitored continuously. The                        Colonoscope was introduced through the anus and                        advanced to the the cecum, identified by appendiceal                        orifice and ileocecal valve. The colonoscopy was                        somewhat difficult due to a tortuous colon. The patient                        tolerated the procedure well. The quality of the bowel                        preparation was good. Findings:      A small polyp was found in the rectum. The polyp was sessile. The polyp       was removed with a hot snare. Resection and retrieval were complete.      Internal hemorrhoids were found during endoscopy. The hemorrhoids were  small and Grade I (internal hemorrhoids that do not prolapse).      The exam was otherwise without  abnormality. Impression:           - One small polyp in the rectum, removed with a hot                        snare. Resected and retrieved.                       - Internal hemorrhoids.                       - The examination was otherwise normal. Recommendation:       - Await pathology results. Manya Silvas, MD 06/28/2018 2:30:00 PM This report has been signed electronically. Number of Addenda: 0 Note Initiated On: 06/28/2018 1:48 PM Scope Withdrawal Time: 0 hours 12 minutes 54 seconds  Total Procedure Duration: 0 hours 22 minutes 58 seconds       [facility]

## 2018-06-28 NOTE — H&P (Signed)
Primary Care Physician:  Adin Hector, MD Primary Gastroenterologist:  Dr. Vira Agar  Pre-Procedure History & Physical: HPI:  Ryan Jarvis is a 67 y.o. male is here for an endoscopy and colonoscopy.  For dysphagia and PH colon polyps.   Past Medical History:  Diagnosis Date  . Anemia   . Anxiety   . Arthritis   . Cancer ([place])   . COPD (chronic obstructive pulmonary disease) ([place])   . Coronary artery disease    04/1996 inferior MI. Cardiac cath; anterolateral and inferior hypokinesis. The left main was normal and mid LAD had a 50% lesion, D2 was 50-75%, mid circumflex was 95% adn there was a totally occluded RCA.  There was distal filling of the RCA from br  . Dyspnea   . Emphysema of lung ([place])   . GERD (gastroesophageal reflux disease)   . Hiatal hernia   . Hypertension   . Myocardial infarction ([place])   . PUD (peptic ulcer disease)   . Stroke ([place])   . Thoracic aortic aneurysm without rupture ([place])   . Tremors of nervous system   . Vitamin B12 deficiency     Past Surgical History:  Procedure Laterality Date  . adenomatous polyps    . BACK SURGERY    . CORONARY ANGIOPLASTY    . STENT REMOVAL      Prior to Admission medications   Medication Sig Start Date End Date Taking? Authorizing Provider  albuterol (VENTOLIN HFA) 108 (90 Base) MCG/ACT inhaler INHALE 2 PUFFS BY MOUTH EVERY 6 HOURS AS NEEDED FOR WHEEZING 04/08/17  Yes [provider]  ALPRAZolam (XANAX) 0.5 MG tablet TAKE ONE TABLET BY MOUTH THREE TIMES DAILY AS NEEDED FOR ANXIETY 03/24/17  Yes [provider]  aspirin 81 MG tablet Take 81 mg daily by mouth.   Yes [provider]  atorvastatin (LIPITOR) 40 MG tablet TAKE ONE TABLET BY MOUTH ONCE DAILY 02/11/17  Yes [provider]  azelastine (ASTELIN) 0.1 % nasal spray Place 1 spray into both nostrils 2 (two) times daily. Use in each nostril as directed   Yes [provider]  clopidogrel (PLAVIX) 75 MG tablet Take 75 mg  by mouth daily.  12/31/15  Yes [provider]  doxepin (SINEQUAN) 10 MG capsule Take 10 mg by mouth.   Yes [provider]  DULoxetine (CYMBALTA) 30 MG capsule Take 3 capsules by mouth daily.  09/11/16  Yes [provider]  furosemide (LASIX) 80 MG tablet TAKE ONE TABLET BY MOUTH ONCE DAILY 06/12/16  Yes [provider]  HYDROcodone-acetaminophen (NORCO) 7.5-325 MG tablet Take 1 tablet by mouth every 6 (six) hours as needed. 12/15/16  Yes [provider]  magnesium oxide (MAG-OX) 400 MG tablet Take 400 mg 2 (two) times daily by mouth.   Yes [provider]  metolazone (ZAROXOLYN) 5 MG tablet Take 1 tablet by mouth as needed.  09/11/16  Yes [provider]  metoprolol succinate (TOPROL-XL) 100 MG 24 hr tablet TAKE ONE TABLET BY MOUTH ONCE DAILY 06/12/16  Yes [provider]  OXYGEN Inhale 2 L into the lungs at bedtime.   Yes [provider]  pantoprazole (PROTONIX) 40 MG tablet Take 40 mg by mouth 2 (two) times daily.  12/31/15  Yes [provider]  potassium chloride SA (K-DUR,KLOR-CON) 20 MEQ tablet Take 1 tablet by mouth 1 day or 1 dose. 09/11/16  Yes [provider]  primidone (MYSOLINE) 250 MG tablet TAKE 1.5 TABLET BY  twice daily 04/17/16  Yes [provider]  QUEtiapine (SEROQUEL) 25 MG tablet Take 25 mg by mouth at bedtime.   Yes [provider]  sucralfate (CARAFATE) 1 g tablet Take 1 g by mouth 2 (two) times daily.  12/31/15  Yes [provider]  tamsulosin (FLOMAX) 0.4 MG CAPS capsule TAKE ONE CAPSULE BY MOUTH ONCE DAILY 30 MINUTES AFTER THE SAME MEAL EACH DAY 06/12/16  Yes [provider]  theophylline (UNIPHYL) 400 MG 24 hr tablet Take 1 tablet by mouth daily.  09/11/16  Yes [provider]  tiotropium (SPIRIVA HANDIHALER) 18 MCG inhalation capsule INHALE ONE DOSE BY MOUTH ONCE DAILY 11/05/15  Yes [provider]  vitamin B-12 (CYANOCOBALAMIN) 1000  MCG tablet Take 1,000 mcg by mouth daily.   Yes [provider]  acetaminophen (TYLENOL) 325 MG tablet Take 2 tablets (650 mg total) by mouth every 4 (four) hours as needed for mild pain (temp > 101.5). Patient not taking: Reported on 10/22/2017 01/13/17   Nicholes Mango, MD  benzonatate (TESSALON) 100 MG capsule Take 200 mg by mouth 3 (three) times daily as needed for cough.    [provider]  Cholecalciferol (VITAMIN D-1000 MAX ST) 1000 units tablet Take 1 tablet by mouth 1 day or 1 dose.    [provider]  diphenhydramine-acetaminophen (TYLENOL PM) 25-500 MG TABS tablet Take 1 tablet by mouth at bedtime as needed.    [provider]  fluticasone furoate-vilanterol (BREO ELLIPTA) 100-25 MCG/INH AEPB Inhale 1 puff into the lungs daily.    [provider]  gabapentin (NEURONTIN) 300 MG capsule Take 600 mg by mouth at bedtime.  04/28/16 04/28/17  [provider]  nitroGLYCERIN (NITROSTAT) 0.4 MG SL tablet Place 0.4 mg every 5 (five) minutes as needed under the tongue for chest pain.    [provider]  promethazine (PHENERGAN) 25 MG tablet Take 25 mg every 6 (six) hours as needed by mouth for nausea or vomiting.    [provider]  saccharomyces boulardii (FLORASTOR) 250 MG capsule Take 1 capsule (250 mg total) by mouth 2 (two) times daily. Patient not taking: Reported on 12/21/2017 01/13/17   Nicholes Mango, MD  [facility] 160-4.5 MCG/ACT inhaler  06/03/17   [provider]  torsemide (DEMADEX) 20 MG tablet Take by mouth. 06/02/17 06/02/18  [provider]    Allergies as of 04/16/2018 - Review Complete 04/12/2018  Allergen Reaction Noted  . Zolpidem Other (See Comments) 04/27/2012  . Belladonna alkaloids  04/29/2011  . Sulfa antibiotics  04/29/2011    Family History  Problem Relation Age of Onset  . Congestive Heart Failure Mother   . Congestive Heart Failure Father     Social History   Socioeconomic History   . Marital status: Married    Spouse name: Not on file  . Number of children: Not on file  . Years of education: Not on file  . Highest education level: Not on file  Occupational History  . Not on file  Social Needs  . Financial resource strain: Not on file  . Food insecurity:    Worry: Not on file    Inability: Not on file  . Transportation needs:    Medical: Not on file    Non-medical: Not on file  Tobacco Use  . Smoking status: Former Smoker    Packs/day: 1.00    Years: 49.00    Pack years: 49.00    Types: Cigarettes  . Smokeless tobacco: Never  Used  . Tobacco comment: quit smoking in 2013  Substance and Sexual Activity  . Alcohol use: No  . Drug use: No  . Sexual activity: Not on file  Lifestyle  . Physical activity:    Days per week: Not on file    Minutes per session: Not on file  . Stress: Not on file  Relationships  . Social connections:    Talks on phone: Not on file    Gets together: Not on file    Attends religious service: Not on file    Active member of club or organization: Not on file    Attends meetings of clubs or organizations: Not on file    Relationship status: Not on file  . Intimate partner violence:    Fear of current or ex partner: Not on file    Emotionally abused: Not on file    Physically abused: Not on file    Forced sexual activity: Not on file  Other Topics Concern  . Not on file  Social History Narrative  . Not on file    Review of Systems: See HPI, otherwise negative ROS  Physical Exam: There were no vitals taken for this visit. General:   Alert,  pleasant and cooperative in NAD Head:  Normocephalic and atraumatic. Neck:  Supple; no masses or thyromegaly. Lungs:  Clear throughout to auscultation.    Heart:  Regular rate and rhythm. Abdomen:  Soft, nontender and nondistended. Normal bowel sounds, without guarding, and without rebound.   Neurologic:  Alert and  oriented x4;  grossly normal  neurologically.  Impression/Plan: Ryan Jarvis is here for an endoscopy and colonoscopy to be performed for Dysphagia and PH colon polyps.  Risks, benefits, limitations, and alternatives regarding  endoscopy and colonoscopy have been reviewed with the patient.  Questions have been answered.  All parties agreeable.   Gaylyn Cheers, MD  06/28/2018, 1:00 PM

## 2018-06-28 NOTE — Op Note (Signed)
[facility] Patient Name: Ryan Jarvis Procedure Date: 06/28/2018 1:47 PM MRN: [id number] Account #: [id number] Date of Birth: 11-Jun-1951 Admit Type: Outpatient Age: 67 Room: [facility] ENDO ROOM 3 Gender: Male Note Status: Finalized Procedure:            Upper GI endoscopy Indications:          Dysphagia Providers:            Manya Silvas, MD Medicines:            Propofol per Anesthesia, Cetacaine spray Complications:        No immediate complications. Procedure:            Pre-Anesthesia Assessment:                       - After reviewing the risks and benefits, the patient                        was deemed in satisfactory condition to undergo the                        procedure.                       After obtaining informed consent, the endoscope was                        passed under direct vision. Throughout the procedure,                        the patient's blood pressure, pulse, and oxygen                        saturations were monitored continuously. The Endoscope                        was introduced through the mouth, and advanced to the                        second part of duodenum. The upper GI endoscopy was                        accomplished without difficulty. The patient tolerated                        the procedure well. Findings:      The examined esophagus was normal. GEJ 40cm.      Excessive fluid was found in the gastric body and in the gastric antrum.       Fluid aspiration was performed. It was thick with thin mushy material.      The examined duodenum was normal. Bulb and second portion. Impression:           - Normal esophagus.                       - Excessive gastric fluid. Fluid aspiration performed.                       - Normal examined duodenum. Recommendation:       Resume usual diet.                       -  The findings and recommendations were discussed with                        the patient's  family. Manya Silvas, MD 06/28/2018 2:40:29 PM This report has been signed electronically. Number of Addenda: 0 Note Initiated On: 06/28/2018 1:47 PM      [facility]

## 2018-06-29 ENCOUNTER — Encounter: Payer: Self-pay | Admitting: Unknown Physician Specialty

## 2018-06-30 LAB — SURGICAL PATHOLOGY

## 2018-06-30 NOTE — Anesthesia Postprocedure Evaluation (Signed)
Anesthesia Post Note  Patient: Ryan Jarvis  Procedure(s) Performed: COLONOSCOPY WITH PROPOFOL (N/A ) ESOPHAGOGASTRODUODENOSCOPY (EGD) WITH PROPOFOL (N/A )  Patient location during evaluation: Endoscopy Anesthesia Type: General Level of consciousness: awake and alert Pain management: pain level controlled Vital Signs Assessment: post-procedure vital signs reviewed and stable Respiratory status: spontaneous breathing, nonlabored ventilation and respiratory function stable Cardiovascular status: blood pressure returned to baseline and stable Postop Assessment: no apparent nausea or vomiting Anesthetic complications: no     Last Vitals:  Vitals:   06/28/18 1441 06/28/18 1501  BP:  124/67  Resp:  16  Temp: (!) 36.1 C   SpO2:      Last Pain:  Vitals:   06/29/18 0758  TempSrc:   PainSc: 0-No pain                 Alphonsus Sias

## 2018-09-03 ENCOUNTER — Encounter: Payer: Self-pay | Admitting: *Deleted

## 2018-10-15 ENCOUNTER — Inpatient Hospital Stay: Payer: Medicare Other | Attending: Radiation Oncology

## 2018-10-15 ENCOUNTER — Ambulatory Visit
Admission: RE | Admit: 2018-10-15 | Discharge: 2018-10-15 | Disposition: A | Discharge status: Home / Self Care | Payer: Medicare Other | Source: Ambulatory Visit | Attending: Radiation Oncology | Admitting: Radiation Oncology

## 2018-10-15 DIAGNOSIS — M8458XA Pathological fracture in neoplastic disease, other specified site, initial encounter for fracture: Secondary | ICD-10-CM | POA: Diagnosis not present

## 2018-10-15 DIAGNOSIS — I700 Atherosclerosis of aorta: Secondary | ICD-10-CM | POA: Insufficient documentation

## 2018-10-15 DIAGNOSIS — J439 Emphysema, unspecified: Secondary | ICD-10-CM | POA: Insufficient documentation

## 2018-10-15 DIAGNOSIS — C3411 Malignant neoplasm of upper lobe, right bronchus or lung: Secondary | ICD-10-CM | POA: Diagnosis not present

## 2018-10-15 DIAGNOSIS — Z923 Personal history of irradiation: Secondary | ICD-10-CM | POA: Insufficient documentation

## 2018-10-15 DIAGNOSIS — I2510 Atherosclerotic heart disease of native coronary artery without angina pectoris: Secondary | ICD-10-CM | POA: Insufficient documentation

## 2018-10-15 DIAGNOSIS — M8580 Other specified disorders of bone density and structure, unspecified site: Secondary | ICD-10-CM | POA: Diagnosis not present

## 2018-10-15 LAB — BASIC METABOLIC PANEL
Anion gap: 12 (ref 5–15)
BUN: 26 mg/dL — ABNORMAL HIGH (ref 8–23)
CALCIUM: 9.5 mg/dL (ref 8.9–10.3)
CO2: 35 mmol/L — AB (ref 22–32)
CREATININE: 1.17 mg/dL (ref 0.61–1.24)
Chloride: 86 mmol/L — ABNORMAL LOW (ref 98–111)
GFR calc non Af Amer: >60 mL/min (ref >60)
Glucose, Bld: 84 mg/dL (ref 70–99)
Potassium: 3.6 mmol/L (ref 3.5–5.1)
Sodium: 133 mmol/L — ABNORMAL LOW (ref 135–145)

## 2018-10-15 LAB — POCT I-STAT CREATININE: Creatinine, Ser: 1.3 mg/dL — ABNORMAL HIGH (ref 0.61–1.24)

## 2018-10-15 MED ORDER — IOHEXOL 300 MG/ML  SOLN
75.0000 mL | Freq: Once | INTRAMUSCULAR | Status: AC | PRN
  Administered 2018-10-15: 75 mL via INTRAVENOUS

## 2018-10-22 ENCOUNTER — Ambulatory Visit: Payer: Medicare Other | Admitting: Radiation Oncology

## 2018-11-05 ENCOUNTER — Encounter: Payer: Self-pay | Admitting: Radiation Oncology

## 2018-11-05 ENCOUNTER — Other Ambulatory Visit: Payer: Self-pay

## 2018-11-05 ENCOUNTER — Other Ambulatory Visit: Payer: Self-pay | Admitting: *Deleted

## 2018-11-05 ENCOUNTER — Ambulatory Visit
Admission: RE | Admit: 2018-11-05 | Discharge: 2018-11-05 | Disposition: A | Discharge status: Home / Self Care | Payer: Medicare Other | Source: Ambulatory Visit | Attending: Radiation Oncology | Admitting: Radiation Oncology

## 2018-11-05 DIAGNOSIS — C3411 Malignant neoplasm of upper lobe, right bronchus or lung: Secondary | ICD-10-CM | POA: Insufficient documentation

## 2018-11-05 DIAGNOSIS — R0602 Shortness of breath: Secondary | ICD-10-CM | POA: Diagnosis not present

## 2018-11-05 DIAGNOSIS — Z923 Personal history of irradiation: Secondary | ICD-10-CM | POA: Insufficient documentation

## 2018-11-05 DIAGNOSIS — R918 Other nonspecific abnormal finding of lung field: Secondary | ICD-10-CM | POA: Diagnosis not present

## 2018-11-05 DIAGNOSIS — Z87891 Personal history of nicotine dependence: Secondary | ICD-10-CM | POA: Diagnosis not present

## 2018-11-05 NOTE — Progress Notes (Signed)
Radiation Oncology Follow up Note  Name: Ryan Jarvis   Date:   11/05/2018 MRN:  [id number] DOB: 1951/08/06    This 67 y.o. 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 Seen today in routine follow-up he continues to do well. He has been having some stable pulmonary symptoms including shortness of breath and dyspnea on exertion. He has slight nonproductive cough no hemoptysis. His most recent CT scan shows.similar to slight decrease in the size of the right upper lobe nodule. There is also some tiny pulmonary nodules noted which we will follow. They were on indeterminate nature.  COMPLICATIONS OF TREATMENT: none  FOLLOW UP COMPLIANCE: keeps appointments   PHYSICAL EXAM:  BP (P) 131/81 (BP Location: Left Arm, Patient Position: Sitting)   Pulse (P) 83   Temp (!) (P) 96.4 F (35.8 C) (Tympanic)   Resp (P) 16   Wt (P) 146 lb 15 oz (66.7 kg)   BMI (P) 23.72 kg/m  Well-developed well-nourished patient in NAD. HEENT reveals PERLA, EOMI, discs not visualized.  Oral cavity is clear. No oral mucosal lesions are identified. Neck is clear without evidence of cervical or supraclavicular adenopathy. Lungs are clear to A&P. Cardiac examination is essentially unremarkable with regular rate and rhythm without murmur rub or thrill. Abdomen is benign with no organomegaly or masses noted. Motor sensory and DTR levels are equal and symmetric in the upper and lower extremities. Cranial nerves II through XII are grossly intact. Proprioception is intact. No peripheral adenopathy or edema is identified. No motor or sensory levels are noted. Crude visual fields are within normal range.  RADIOLOGY RESULTS: CT scans reviewed and compatible with the above-stated  findings  PLAN: present time patient continues to do well from her lung standpoint. I believe it's safe to follow-up in 6 months with a repeat CT scan. We'll keep an eye on these other very small tiny pulmonary nodules. Patient Plans my treatment plan well.  I would like to take this opportunity to thank you for allowing me to participate in the care of your patient.Noreene Filbert, MD

## 2019-04-25 ENCOUNTER — Ambulatory Visit: Payer: Medicare Other

## 2019-05-06 ENCOUNTER — Ambulatory Visit: Payer: Medicare Other | Admitting: Radiation Oncology

## 2019-05-12 ENCOUNTER — Ambulatory Visit
Admission: RE | Admit: 2019-05-12 | Discharge: 2019-05-12 | Disposition: A | Discharge status: Home / Self Care | Payer: Medicare Other | Source: Ambulatory Visit | Attending: Radiation Oncology | Admitting: Radiation Oncology

## 2019-05-12 ENCOUNTER — Other Ambulatory Visit: Payer: Self-pay

## 2019-05-12 DIAGNOSIS — C3411 Malignant neoplasm of upper lobe, right bronchus or lung: Secondary | ICD-10-CM | POA: Diagnosis present

## 2019-05-12 LAB — POCT I-STAT CREATININE: Creatinine, Ser: 1.5 mg/dL — ABNORMAL HIGH (ref 0.61–1.24)

## 2019-05-12 MED ORDER — IOHEXOL 300 MG/ML  SOLN
75.0000 mL | Freq: Once | INTRAMUSCULAR | Status: AC | PRN
  Administered 2019-05-12: 08:00:00 75 mL via INTRAVENOUS

## 2019-05-17 ENCOUNTER — Other Ambulatory Visit: Payer: Self-pay

## 2019-05-18 ENCOUNTER — Other Ambulatory Visit: Payer: Self-pay

## 2019-05-18 ENCOUNTER — Encounter: Payer: Self-pay | Admitting: Radiation Oncology

## 2019-05-18 ENCOUNTER — Other Ambulatory Visit: Payer: Self-pay | Admitting: *Deleted

## 2019-05-18 ENCOUNTER — Ambulatory Visit
Admission: RE | Admit: 2019-05-18 | Discharge: 2019-05-18 | Disposition: A | Discharge status: Home / Self Care | Payer: Medicare Other | Source: Ambulatory Visit | Attending: Radiation Oncology | Admitting: Radiation Oncology

## 2019-05-18 VITALS — BP 118/78 | HR 76 | Temp 97.7°F | Resp 16 | Wt 142.4 lb

## 2019-05-18 DIAGNOSIS — Z923 Personal history of irradiation: Secondary | ICD-10-CM | POA: Diagnosis not present

## 2019-05-18 DIAGNOSIS — Z85118 Personal history of other malignant neoplasm of bronchus and lung: Secondary | ICD-10-CM | POA: Diagnosis not present

## 2019-05-18 DIAGNOSIS — Z87891 Personal history of nicotine dependence: Secondary | ICD-10-CM | POA: Insufficient documentation

## 2019-05-18 DIAGNOSIS — Z993 Dependence on wheelchair: Secondary | ICD-10-CM | POA: Insufficient documentation

## 2019-05-18 DIAGNOSIS — C3411 Malignant neoplasm of upper lobe, right bronchus or lung: Secondary | ICD-10-CM

## 2019-05-18 NOTE — Progress Notes (Signed)
Radiation Oncology Follow up Note  Name: Ryan Jarvis   Date:   05/18/2019 MRN:  [id number] DOB: [birth year]    This 68 y.o. male presents to the clinic today for 2-1/2-year follow-up status post SBRT to his right upper lobe for stage I adenocarcinoma.  REFERRING PROVIDER: Adin Hector, MD  HPI: Patient is a 68 year old male now about 2-1/2 years having completed SBRT treatment to his right upper lobe for stage I adenocarcinoma.  Seen today in routine follow-up he is doing fair.  He is wheelchair-bound.  He states his pulmonary functions are stable although he has shortness of breath and dyspnea on exertion.  He had a recent CT scan.  Showing stable right upper lobe pulmonary nodule which was the area of SBRT.  He also has a stable cavitary nodule in the superior segment the right lower lobe with recommendation for continued surveillance.  He also has some stable small right hilar lymph nodes.  COMPLICATIONS OF TREATMENT: none  FOLLOW UP COMPLIANCE: keeps appointments   PHYSICAL EXAM:  BP 118/78 (BP Location: Left Arm, Patient Position: Sitting)   Pulse 76   Temp 97.7 F (36.5 C) (Tympanic)   Resp 16   Wt 142 lb 6.7 oz (64.6 kg)   BMI 22.99 kg/m  Wheelchair-bound male in NAD.  Well-developed well-nourished patient in NAD. HEENT reveals PERLA, EOMI, discs not visualized.  Oral cavity is clear. No oral mucosal lesions are identified. Neck is clear without evidence of cervical or supraclavicular adenopathy. Lungs are clear to A&P. Cardiac examination is essentially unremarkable with regular rate and rhythm without murmur rub or thrill. Abdomen is benign with no organomegaly or masses noted. Motor sensory and DTR levels are equal and symmetric in the upper and lower extremities. Cranial nerves II through XII are grossly intact. Proprioception is intact. No peripheral adenopathy or edema is identified. No motor or sensory levels are noted. Crude visual fields are within normal  range.  RADIOLOGY RESULTS: CT scans reviewed and compatible with above-stated findings  PLAN: Present time patient is doing well with no evidence of disease by recent CT criteria.  I am pleased with his overall progress.  I asked to see him back in 1 year for follow-up with a CT scan prior to that visit.  Patient knows to call at anytime with any concerns.  I would like to take this opportunity to thank you for allowing me to participate in the care of your patient.Noreene Filbert, MD

## 2019-05-30 ENCOUNTER — Other Ambulatory Visit: Payer: Self-pay | Admitting: Internal Medicine

## 2019-06-29 ENCOUNTER — Other Ambulatory Visit: Payer: Self-pay | Admitting: Internal Medicine

## 2019-06-30 ENCOUNTER — Other Ambulatory Visit: Payer: Self-pay | Admitting: Internal Medicine

## 2019-06-30 DIAGNOSIS — I714 Abdominal aortic aneurysm, without rupture, unspecified: Secondary | ICD-10-CM

## 2019-06-30 DIAGNOSIS — I10 Essential (primary) hypertension: Secondary | ICD-10-CM

## 2019-07-06 ENCOUNTER — Other Ambulatory Visit: Payer: Self-pay

## 2019-07-06 ENCOUNTER — Ambulatory Visit
Admission: RE | Admit: 2019-07-06 | Discharge: 2019-07-06 | Disposition: A | Discharge status: Home / Self Care | Payer: Medicare Other | Source: Ambulatory Visit | Attending: Internal Medicine | Admitting: Internal Medicine

## 2019-07-06 DIAGNOSIS — I714 Abdominal aortic aneurysm, without rupture, unspecified: Secondary | ICD-10-CM

## 2019-07-06 DIAGNOSIS — I10 Essential (primary) hypertension: Secondary | ICD-10-CM | POA: Insufficient documentation

## 2019-07-08 ENCOUNTER — Other Ambulatory Visit: Payer: Self-pay | Admitting: Internal Medicine

## 2019-07-08 DIAGNOSIS — I714 Abdominal aortic aneurysm, without rupture, unspecified: Secondary | ICD-10-CM

## 2020-05-11 ENCOUNTER — Other Ambulatory Visit: Payer: Self-pay

## 2020-05-11 ENCOUNTER — Telehealth: Payer: Self-pay | Admitting: *Deleted

## 2020-05-11 ENCOUNTER — Ambulatory Visit
Admission: RE | Admit: 2020-05-11 | Discharge: 2020-05-11 | Disposition: A | Discharge status: Home / Self Care | Payer: Medicare Other | Source: Ambulatory Visit | Attending: Radiation Oncology | Admitting: Radiation Oncology

## 2020-05-11 DIAGNOSIS — C3411 Malignant neoplasm of upper lobe, right bronchus or lung: Secondary | ICD-10-CM | POA: Diagnosis not present

## 2020-05-11 LAB — POCT I-STAT CREATININE: Creatinine, Ser: 0.7 mg/dL (ref 0.61–1.24)

## 2020-05-11 MED ORDER — IOHEXOL 300 MG/ML  SOLN
75.0000 mL | Freq: Once | INTRAMUSCULAR | Status: AC | PRN
  Administered 2020-05-11: 75 mL via INTRAVENOUS

## 2020-05-11 NOTE — Telephone Encounter (Signed)
IMPRESSION: 1. Interval progression of the nodular opacity in the superior segment of the right lower lobe. Given the interval growth, imaging features are concerning for neoplasm. PET-CT recommended to further evaluate. 2. Aortic Atherosclerosis (ICD10-I70.0) and Emphysema (ICD10-J43.9).  These results will be called to the ordering clinician or representative by the Radiologist Assistant, and communication documented in the PACS or [employer].  Electronically Signed: By: Misty Stanley M.D. On: 05/11/2020 12:45  ADDENDUM REPORT: 05/11/2020 12:50  ADDENDUM: As noted in the body of the report, there is a new 2 cm mixed attenuation lesion in the anterior right liver. Abdominal MRI without with contrast would be the study of choice to further evaluate. This could also be further assessed at the time of follow-up PET-CT.   Electronically Signed   By: Misty Stanley M.D.   On: 05/11/2020 12:50

## 2020-05-15 ENCOUNTER — Other Ambulatory Visit: Payer: Self-pay

## 2020-05-16 ENCOUNTER — Other Ambulatory Visit: Payer: Self-pay | Admitting: *Deleted

## 2020-05-16 ENCOUNTER — Ambulatory Visit
Admission: RE | Admit: 2020-05-16 | Discharge: 2020-05-16 | Disposition: A | Discharge status: Home / Self Care | Payer: Medicare Other | Source: Ambulatory Visit | Attending: Radiation Oncology | Admitting: Radiation Oncology

## 2020-05-16 VITALS — BP 98/63 | HR 77 | Temp 94.7°F | Resp 18 | Wt 137.6 lb

## 2020-05-16 DIAGNOSIS — R531 Weakness: Secondary | ICD-10-CM | POA: Diagnosis not present

## 2020-05-16 DIAGNOSIS — R1013 Epigastric pain: Secondary | ICD-10-CM | POA: Insufficient documentation

## 2020-05-16 DIAGNOSIS — K769 Liver disease, unspecified: Secondary | ICD-10-CM | POA: Diagnosis not present

## 2020-05-16 DIAGNOSIS — C3411 Malignant neoplasm of upper lobe, right bronchus or lung: Secondary | ICD-10-CM | POA: Diagnosis not present

## 2020-05-16 DIAGNOSIS — R918 Other nonspecific abnormal finding of lung field: Secondary | ICD-10-CM | POA: Diagnosis not present

## 2020-05-16 DIAGNOSIS — R5383 Other fatigue: Secondary | ICD-10-CM | POA: Diagnosis not present

## 2020-05-16 NOTE — Progress Notes (Signed)
Radiation Oncology Follow up Note  Name: Ryan Jarvis   Date:   05/16/2020 MRN:  [id number] DOB: [birth year]    This 69 y.o. male presents to the clinic today for 3-1/2-year follow-up status post SBRT to his right upper lobe for stage I adenocarcinoma.  REFERRING PROVIDER: Adin Hector, MD  HPI: Patient is a 69 year old male now 3 and half years having completed SBRT to his right upper lobe for stage I adenocarcinoma.  Seen today in routine follow-up he is doing fair.  He continues to have problems with stomach upset fatigue and overall general malaise.  He specifically denies cough hemoptysis or chest tightness.  He had a recent CT scan.  Showing interval progression of a nodular opacity in the superior segment the right lower lobe.  The lesion we treated in his right upper lobe is stable over time consistent with treatment effect.  COMPLICATIONS OF TREATMENT: none  FOLLOW UP COMPLIANCE: keeps appointments   PHYSICAL EXAM:  BP 98/63   Pulse 77   Temp (!) 94.7 F (34.8 C)   Resp 18   Wt 137 lb 9.6 oz (62.4 kg)   BMI 22.21 kg/m  Frail-appearing wheelchair-bound male in NAD.  He has marked ecchymoses on his skin.  Well-developed well-nourished patient in NAD. HEENT reveals PERLA, EOMI, discs not visualized.  Oral cavity is clear. No oral mucosal lesions are identified. Neck is clear without evidence of cervical or supraclavicular adenopathy. Lungs are clear to A&P. Cardiac examination is essentially unremarkable with regular rate and rhythm without murmur rub or thrill. Abdomen is benign with no organomegaly or masses noted. Motor sensory and DTR levels are equal and symmetric in the upper and lower extremities. Cranial nerves II through XII are grossly intact. Proprioception is intact. No peripheral adenopathy or edema is identified. No motor or sensory levels are noted. Crude visual fields are within normal range.  RADIOLOGY RESULTS: CT scan reviewed PET CT scan ordered  PLAN: I  have ordered a PET CT to clarify the new lesion in the right lower lobe.  Was also mention in an addendum to his radiology report about a 2 cm mixed attenuation lesion anterior right liver.  This may be also clarified on PET/CT.  I have asked to see the patient back in follow-up shortly after his PET/CT is performed.  If this is hypermetabolic would offer SBRT to this new lesion also treating it as a non-small cell lung cancer.  Patient comprehends my recommendations well.  I would like to take this opportunity to thank you for allowing me to participate in the care of your patient.Noreene Filbert, MD

## 2020-05-23 ENCOUNTER — Ambulatory Visit
Admission: RE | Admit: 2020-05-23 | Discharge: 2020-05-23 | Disposition: A | Discharge status: Home / Self Care | Payer: Medicare Other | Source: Ambulatory Visit | Attending: Radiation Oncology | Admitting: Radiation Oncology

## 2020-05-23 ENCOUNTER — Other Ambulatory Visit: Payer: Self-pay

## 2020-05-23 DIAGNOSIS — M899 Disorder of bone, unspecified: Secondary | ICD-10-CM | POA: Insufficient documentation

## 2020-05-23 DIAGNOSIS — K5730 Diverticulosis of large intestine without perforation or abscess without bleeding: Secondary | ICD-10-CM | POA: Insufficient documentation

## 2020-05-23 DIAGNOSIS — J320 Chronic maxillary sinusitis: Secondary | ICD-10-CM | POA: Diagnosis not present

## 2020-05-23 DIAGNOSIS — I714 Abdominal aortic aneurysm, without rupture: Secondary | ICD-10-CM | POA: Insufficient documentation

## 2020-05-23 DIAGNOSIS — J439 Emphysema, unspecified: Secondary | ICD-10-CM | POA: Insufficient documentation

## 2020-05-23 DIAGNOSIS — C3411 Malignant neoplasm of upper lobe, right bronchus or lung: Secondary | ICD-10-CM | POA: Diagnosis not present

## 2020-05-23 DIAGNOSIS — R918 Other nonspecific abnormal finding of lung field: Secondary | ICD-10-CM | POA: Diagnosis not present

## 2020-05-23 DIAGNOSIS — I2510 Atherosclerotic heart disease of native coronary artery without angina pectoris: Secondary | ICD-10-CM | POA: Insufficient documentation

## 2020-05-23 LAB — GLUCOSE, CAPILLARY: Glucose-Capillary: 73 mg/dL (ref 70–99)

## 2020-05-23 MED ORDER — FLUDEOXYGLUCOSE F - 18 (FDG) INJECTION
7.1000 | Freq: Once | INTRAVENOUS | Status: AC | PRN
  Administered 2020-05-23: 7.1 via INTRAVENOUS

## 2020-05-29 ENCOUNTER — Telehealth: Payer: Self-pay | Admitting: Radiology

## 2020-05-29 NOTE — Telephone Encounter (Signed)
Office faxed over an order for u/s abdominal aorta by Dr. Caryl Comes on 4-1.  This order is incorrect.  The correct order should be u/s aorta duplex limited.  The office was contacted via phone to fax the corrected order.  Office was emailed on 5-25 and 6-4.  The correct order was never received.  The incorrect order has been purged from the main fax que.

## 2020-05-31 ENCOUNTER — Encounter: Payer: Self-pay | Admitting: Radiation Oncology

## 2020-05-31 ENCOUNTER — Ambulatory Visit
Admission: RE | Admit: 2020-05-31 | Discharge: 2020-05-31 | Disposition: A | Discharge status: Home / Self Care | Payer: Medicare Other | Source: Ambulatory Visit | Attending: Radiation Oncology | Admitting: Radiation Oncology

## 2020-05-31 ENCOUNTER — Other Ambulatory Visit: Payer: Self-pay | Admitting: *Deleted

## 2020-05-31 ENCOUNTER — Other Ambulatory Visit: Payer: Self-pay

## 2020-05-31 VITALS — BP 125/83 | HR 83 | Temp 96.0°F | Resp 20 | Wt 135.4 lb

## 2020-05-31 DIAGNOSIS — C3411 Malignant neoplasm of upper lobe, right bronchus or lung: Secondary | ICD-10-CM | POA: Diagnosis not present

## 2020-05-31 DIAGNOSIS — K769 Liver disease, unspecified: Secondary | ICD-10-CM | POA: Diagnosis not present

## 2020-05-31 DIAGNOSIS — R5381 Other malaise: Secondary | ICD-10-CM | POA: Insufficient documentation

## 2020-05-31 NOTE — Progress Notes (Signed)
Radiation Oncology Follow up Note  Name: Ryan Jarvis   Date:   05/31/2020 MRN:  [id number] DOB: [birth year]    This 69 y.o. 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  He has been complaining of some abdominal upset and general malaise.  Recent CT scan showed interval progression of nodular opacity superior segment of the right lower lobe..  Also on addendum there was a 2 cm mixed attenuating lesion in the right anterior liver.  He underwent PET CT scan showing hypermetabolic oval-shaped nodule in the superior segment the right lower lobe abutting the major fissure compatible with malignancy.  There was also hyperbolic mass in the right hepatic lobe highly hypermetabolic with enlarged porta hepatis lymph nodes compatible with active malignancy.  He continues to have general malaise and abdominal complaints.  COMPLICATIONS OF TREATMENT: none  FOLLOW UP COMPLIANCE: keeps appointments   PHYSICAL EXAM:  BP 125/83 (BP Location: Right Arm, Patient Position: Sitting, Cuff Size: Small)   Pulse 83   Temp (!) 96 F (35.6 C) (Tympanic)   Resp 20   Wt 135 lb 6.4 oz (61.4 kg)   BMI 21.85 kg/m  Wheelchair-bound male in NAD.  Well-developed well-nourished patient in NAD. HEENT reveals PERLA, EOMI, discs not visualized.  Oral cavity is clear. No oral mucosal lesions are identified. Neck is clear without evidence of cervical or supraclavicular adenopathy. Lungs are clear to A&P. Cardiac examination is essentially unremarkable with regular rate and rhythm without murmur rub or thrill. Abdomen is benign with no organomegaly or masses noted. Motor sensory and DTR levels are equal and symmetric in the upper and lower  extremities. Cranial nerves II through XII are grossly intact. Proprioception is intact. No peripheral adenopathy or edema is identified. No motor or sensory levels are noted. Crude visual fields are within normal range.  RADIOLOGY RESULTS: PET CT scan reviewed compatible with above-stated findings  PLAN: At this time I have ordered a CT-guided biopsy of his liver lesion.  Most referring him to medical oncology for evaluation and treatment recommendations.  Lung nodule is of minor importance at this time.  I have asked to see him back in 3 months for follow-up will continue following with recommendations of medical oncology.  I would like to take this opportunity to thank you for allowing me to participate in the care of your patient.Noreene Filbert, MD

## 2020-06-01 NOTE — Progress Notes (Signed)
Patient on schedule for liver biopsy 06/06/2020, spoke with daughter, made aware to be here @ 0930, NPO after MN, along with driver for home after discharge. Stated understanding.

## 2020-06-04 ENCOUNTER — Inpatient Hospital Stay: Payer: Medicare Other

## 2020-06-04 ENCOUNTER — Other Ambulatory Visit: Payer: Self-pay

## 2020-06-04 ENCOUNTER — Encounter: Payer: Self-pay | Admitting: Internal Medicine

## 2020-06-04 ENCOUNTER — Inpatient Hospital Stay: Payer: Medicare Other | Attending: Internal Medicine | Admitting: Internal Medicine

## 2020-06-04 ENCOUNTER — Encounter: Payer: Self-pay | Admitting: *Deleted

## 2020-06-04 DIAGNOSIS — Z7982 Long term (current) use of aspirin: Secondary | ICD-10-CM | POA: Diagnosis not present

## 2020-06-04 DIAGNOSIS — M549 Dorsalgia, unspecified: Secondary | ICD-10-CM | POA: Insufficient documentation

## 2020-06-04 DIAGNOSIS — I10 Essential (primary) hypertension: Secondary | ICD-10-CM | POA: Insufficient documentation

## 2020-06-04 DIAGNOSIS — J449 Chronic obstructive pulmonary disease, unspecified: Secondary | ICD-10-CM | POA: Diagnosis not present

## 2020-06-04 DIAGNOSIS — Z79899 Other long term (current) drug therapy: Secondary | ICD-10-CM | POA: Insufficient documentation

## 2020-06-04 DIAGNOSIS — C3411 Malignant neoplasm of upper lobe, right bronchus or lung: Secondary | ICD-10-CM | POA: Diagnosis present

## 2020-06-04 DIAGNOSIS — Z9981 Dependence on supplemental oxygen: Secondary | ICD-10-CM | POA: Insufficient documentation

## 2020-06-04 DIAGNOSIS — R5383 Other fatigue: Secondary | ICD-10-CM | POA: Insufficient documentation

## 2020-06-04 DIAGNOSIS — Z515 Encounter for palliative care: Secondary | ICD-10-CM | POA: Diagnosis not present

## 2020-06-04 DIAGNOSIS — C787 Secondary malignant neoplasm of liver and intrahepatic bile duct: Secondary | ICD-10-CM | POA: Insufficient documentation

## 2020-06-04 DIAGNOSIS — G8929 Other chronic pain: Secondary | ICD-10-CM | POA: Diagnosis not present

## 2020-06-04 DIAGNOSIS — F419 Anxiety disorder, unspecified: Secondary | ICD-10-CM | POA: Diagnosis not present

## 2020-06-04 DIAGNOSIS — R5381 Other malaise: Secondary | ICD-10-CM | POA: Diagnosis not present

## 2020-06-04 DIAGNOSIS — Z8711 Personal history of peptic ulcer disease: Secondary | ICD-10-CM | POA: Insufficient documentation

## 2020-06-04 DIAGNOSIS — K219 Gastro-esophageal reflux disease without esophagitis: Secondary | ICD-10-CM | POA: Diagnosis not present

## 2020-06-04 DIAGNOSIS — I252 Old myocardial infarction: Secondary | ICD-10-CM | POA: Diagnosis not present

## 2020-06-04 DIAGNOSIS — Z8673 Personal history of transient ischemic attack (TIA), and cerebral infarction without residual deficits: Secondary | ICD-10-CM | POA: Diagnosis not present

## 2020-06-04 DIAGNOSIS — Z7951 Long term (current) use of inhaled steroids: Secondary | ICD-10-CM | POA: Insufficient documentation

## 2020-06-04 DIAGNOSIS — Z955 Presence of coronary angioplasty implant and graft: Secondary | ICD-10-CM | POA: Diagnosis not present

## 2020-06-04 DIAGNOSIS — I2510 Atherosclerotic heart disease of native coronary artery without angina pectoris: Secondary | ICD-10-CM | POA: Insufficient documentation

## 2020-06-04 DIAGNOSIS — M1990 Unspecified osteoarthritis, unspecified site: Secondary | ICD-10-CM | POA: Diagnosis not present

## 2020-06-04 DIAGNOSIS — Z87891 Personal history of nicotine dependence: Secondary | ICD-10-CM | POA: Insufficient documentation

## 2020-06-04 LAB — COMPREHENSIVE METABOLIC PANEL
ALT: 29 U/L (ref 0–44)
AST: 26 U/L (ref 15–41)
Albumin: 4.3 g/dL (ref 3.5–5.0)
Alkaline Phosphatase: 115 U/L (ref 38–126)
Anion gap: 15 (ref 5–15)
BUN: 25 mg/dL — ABNORMAL HIGH (ref 8–23)
CO2: 34 mmol/L — ABNORMAL HIGH (ref 22–32)
Calcium: 9 mg/dL (ref 8.9–10.3)
Chloride: 84 mmol/L — ABNORMAL LOW (ref 98–111)
Creatinine, Ser: 0.92 mg/dL (ref 0.61–1.24)
GFR calc Af Amer: >60 mL/min (ref >60)
GFR calc non Af Amer: >60 mL/min (ref >60)
Glucose, Bld: 91 mg/dL (ref 70–99)
Potassium: 3.7 mmol/L (ref 3.5–5.1)
Sodium: 133 mmol/L — ABNORMAL LOW (ref 135–145)
Total Bilirubin: 0.8 mg/dL (ref 0.3–1.2)
Total Protein: 7.9 g/dL (ref 6.5–8.1)

## 2020-06-04 LAB — CBC WITH DIFFERENTIAL/PLATELET
Abs Immature Granulocytes: 0.03 10*3/uL (ref 0.00–0.07)
Basophils Absolute: 0 10*3/uL (ref 0.0–0.1)
Basophils Relative: 0 %
Eosinophils Absolute: 0.1 10*3/uL (ref 0.0–0.5)
Eosinophils Relative: 1 %
HCT: 41.3 % (ref 39.0–52.0)
Hemoglobin: 14 g/dL (ref 13.0–17.0)
Immature Granulocytes: 0 %
Lymphocytes Relative: 13 %
Lymphs Abs: 1.2 10*3/uL (ref 0.7–4.0)
MCH: 32.9 pg (ref 26.0–34.0)
MCHC: 33.9 g/dL (ref 30.0–36.0)
MCV: 96.9 fL (ref 80.0–100.0)
Monocytes Absolute: 0.8 10*3/uL (ref 0.1–1.0)
Monocytes Relative: 8 %
Neutro Abs: 7 10*3/uL (ref 1.7–7.7)
Neutrophils Relative %: 78 %
Platelets: 261 10*3/uL (ref 150–400)
RBC: 4.26 MIL/uL (ref 4.22–5.81)
RDW: 12.9 % (ref 11.5–15.5)
WBC: 9.1 10*3/uL (ref 4.0–10.5)
nRBC: 0 % (ref 0.0–0.2)

## 2020-06-04 LAB — PROTIME-INR
INR: 1 (ref 0.8–1.2)
Prothrombin Time: 13 seconds (ref 11.4–15.2)

## 2020-06-04 LAB — APTT: aPTT: 27 seconds (ref 24–36)

## 2020-06-04 NOTE — Assessment & Plan Note (Addendum)
#  History of stage I lung cancer-s/p SBRT 2018; currently with new right lower lobe lesion; liver lesion and also porta hepatis lymphadenopathy concerning for metastatic disease.  Patient awaiting biopsy on June 23rd.  Will also need MRI brain-order at next visit.  #Had a long discussion the patient and his daughter regarding the concerns for progressive malignancy/metastatic disease.  However a biopsy will confirm our concerns; also the histopathology.  Would recommend NGS on the liver sample.  #Discussed option of treatment including chemo immunotherapy x4 cycles followed by immunotherapy in general.  However final recommendations based upon final pathology results.  # Severe COPD/ chronic back pain-borderline performance status.  See discussion below  # palliative care evaluation: Introduced palliative care philosophy and services. I discussed the need for palliative care evaluation/symptom management to help quality of life in the context of incurable disease.  Patient is interested; will make referral-at next visit.  Thank you Dr.Chrystal for allowing me to participate in the care of your pleasant patient. Please do not hesitate to contact me with questions or concerns in the interim.  # DISPOSITION: # labs- cbc/cmp/cea; PT/PTT [please order] # follow up on 6/29-9:00am; MD; No labs-Dr.B  # I reviewed the blood work- with the patient in detail; also reviewed the imaging independently [as summarized above]; and with the patient in detail.

## 2020-06-04 NOTE — Progress Notes (Signed)
  Oncology Nurse Navigator Documentation  Navigator Location: [facility]Med [facility] (06/04/20 1500)   )Navigator Encounter Type: Initial MedOnc (06/04/20 1500)   Abnormal Finding Date: 05/11/20 (06/04/20 1500)                   Treatment Phase: Abnormal Scans (06/04/20 1500) Barriers/Navigation Needs: Coordination of Care;Morbidities/Frailty (06/04/20 1500)   Interventions: Coordination of Care (06/04/20 1500)   Coordination of Care: Appts (06/04/20 1500)        Acuity: Level 2-Minimal Needs (1-2 Barriers Identified) (06/04/20 1500)    met with patient and his daughter during initial medonc consultation with Dr. Rogue Bussing. All questions answered during visit. Reviewed upcoming appts. Instructed to call with any further questions or needs. Pt and his daughter verbalized understanding.     Time Spent with Patient: 60 (06/04/20 1500)

## 2020-06-04 NOTE — Progress Notes (Signed)
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 Also the lesion in the dome of the right hepatic lobe is highly hypermetabolic, and there is a hypermetabolic and enlarged porta hepatis lymph nodes compatible with active Malignancy].   # CAD s/p stent; Hx of tremors [Dr.Shah]  # NGS/MOLECULAR TESTS:    # PALLIATIVE CARE EVALUATION:  # PAIN MANAGEMENT:    DIAGNOSIS:   STAGE:         ;  GOALS:  CURRENT/MOST RECENT THERAPY :     Cancer of upper lobe of right lung ([place])  03/02/2017 Initial Diagnosis   Cancer of upper lobe of right lung (HCC)      HISTORY OF PRESENTING ILLNESS:  Ryan Jarvis 69 y.o.  male history of smoking; severe COPD and history of stage I lung cancer is here for further evaluation and recommendations for lung mass; liver lesion noted on surveillance imaging.   Patient 2018 diagnosed with stage I right upper lung cancer s/p biopsy; adenocarcinoma.  Patient deemed not a surgical candidate; and hence recommended SBRT.  On surveillance imaging patient noted to have [as above]-new right lung lesion; liver lesion and also porta hepatis lesion.  Patient awaiting liver biopsy tomorrow.  Patient overall feeling poorly.  Complains of fatigue.  Cough and shortness of breath.  Chronic mild diarrhea/heartburn.  Chronic back pain joint pains.  He has intermittent headaches/history of tremors.  He does have history of easy bruising secondary to history of being on antiplatelet therapy.   Review of Systems   Constitutional: Positive for malaise/fatigue. Negative for chills, diaphoresis, fever and weight loss.  HENT: Negative for nosebleeds and sore throat.   Eyes: Negative for double vision.  Respiratory: Positive for cough, sputum production and shortness of breath. Negative for hemoptysis and wheezing.   Cardiovascular: Negative for chest pain, palpitations, orthopnea and leg swelling.  Gastrointestinal: Positive for diarrhea and heartburn. Negative for abdominal pain, blood in stool, constipation, melena, nausea and vomiting.  Genitourinary: Negative for dysuria, frequency and urgency.  Musculoskeletal: Positive for back pain and joint pain.  Skin: Negative.  Negative for itching and rash.  Neurological: Positive for headaches. Negative for dizziness, tingling, focal weakness and weakness.  Endo/Heme/Allergies: Bruises/bleeds easily.  Psychiatric/Behavioral: Negative for depression. The patient is not nervous/anxious and does not have insomnia.      MEDICAL HISTORY:  Past Medical History:  Diagnosis Date  . Anemia   . Anxiety   . Arthritis   . Cancer ([place])   . COPD (chronic obstructive pulmonary disease) ([place])   . Coronary artery disease    04/1996 inferior MI. Cardiac cath; anterolateral and inferior hypokinesis. The left main was normal and mid LAD had a 50% lesion, D2 was 50-75%, mid circumflex was 95% adn there was a totally occluded RCA.  There was distal filling of the RCA from br  . Dyspnea   . Emphysema of lung ([place])   . GERD (gastroesophageal reflux disease)   . Hiatal hernia   . Hypertension   . Myocardial infarction ([place])   . PUD (peptic ulcer disease)   .  Stroke ([place])   . Thoracic aortic aneurysm without rupture ([place])   . Tremors of nervous system   . Vitamin B12 deficiency     SURGICAL HISTORY: Past Surgical History:  Procedure Laterality Date  . adenomatous polyps    . BACK SURGERY    . COLONOSCOPY WITH PROPOFOL N/A 06/28/2018   Procedure: COLONOSCOPY WITH  PROPOFOL;  Surgeon: Manya Silvas, MD;  Location: [facility] ENDOSCOPY;  Service: Endoscopy;  Laterality: N/A;  . CORONARY ANGIOPLASTY    . ESOPHAGOGASTRODUODENOSCOPY (EGD) WITH PROPOFOL N/A 06/28/2018   Procedure: ESOPHAGOGASTRODUODENOSCOPY (EGD) WITH PROPOFOL;  Surgeon: Manya Silvas, MD;  Location: [facility] ENDOSCOPY;  Service: Endoscopy;  Laterality: N/A;  . STENT REMOVAL      SOCIAL HISTORY: Social History   Socioeconomic History  . Marital status: Married    Spouse name: Not on file  . Number of children: Not on file  . Years of education: Not on file  . Highest education level: Not on file  Occupational History  . Not on file  Tobacco Use  . Smoking status: Former Smoker    Packs/day: 1.00    Years: 49.00    Pack years: 49.00    Types: Cigarettes  . Smokeless tobacco: Never Used  . Tobacco comment: quit smoking in 2013  Substance and Sexual Activity  . Alcohol use: No  . Drug use: No  . Sexual activity: Not on file  Other Topics Concern  . 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    Social Determinants of Health   Financial Resource Strain:   . Difficulty of Paying Living Expenses:   Food Insecurity:   . Worried About Charity fundraiser in the Last Year:   . Arboriculturist in the Last Year:   Transportation Needs:   . Film/video editor (Medical):   Marland Kitchen Lack of Transportation (Non-Medical):   Physical Activity:   . Days of Exercise per Week:   . Minutes of Exercise per Session:   Stress:   . Feeling of Stress :   Social Connections:   . Frequency of Communication with Friends and Family:   . Frequency of Social Gatherings with Friends and Family:   . Attends Religious Services:   . Active Member of Clubs or Organizations:   . Attends Archivist Meetings:   Marland Kitchen Marital Status:   Intimate Partner Violence:   . Fear of  Current or Ex-Partner:   . Emotionally Abused:   Marland Kitchen Physically Abused:   . Sexually Abused:     FAMILY HISTORY: Family History  Problem Relation Age of Onset  . Congestive Heart Failure Mother   . Congestive Heart Failure Father     ALLERGIES:  is allergic to zolpidem, belladonna alkaloids, and sulfa antibiotics.  MEDICATIONS:  Current Outpatient Medications  Medication Sig Dispense Refill  . acetaminophen (TYLENOL) 325 MG tablet Take 2 tablets (650 mg total) by mouth every 4 (four) hours as needed for mild pain (temp > 101.5).    Marland Kitchen albuterol (VENTOLIN HFA) 108 (90 Base) MCG/ACT inhaler INHALE 2 PUFFS BY MOUTH EVERY 6 HOURS AS NEEDED FOR WHEEZING    . ALPRAZolam (XANAX) 0.5 MG tablet TAKE ONE TABLET BY MOUTH THREE TIMES DAILY AS NEEDED FOR ANXIETY    . aspirin 81 MG tablet Take 81 mg daily by mouth.    Marland Kitchen  atorvastatin (LIPITOR) 40 MG tablet TAKE ONE TABLET BY MOUTH ONCE DAILY    . azelastine (ASTELIN) 0.1 % nasal spray Place 1 spray into both nostrils 2 (two) times daily. Use in each nostril as directed    . benzonatate (TESSALON) 100 MG capsule Take 200 mg by mouth 3 (three) times daily as needed for cough.    . Cholecalciferol (VITAMIN D-1000 MAX ST) 1000 units tablet Take 1 tablet by mouth 1 day or 1 dose.    . diphenhydramine-acetaminophen (TYLENOL PM) 25-500 MG TABS tablet Take 1 tablet by mouth at bedtime as needed.    . doxepin (SINEQUAN) 10 MG capsule Take 10 mg by mouth.    . DULoxetine (CYMBALTA) 30 MG capsule Take 3 capsules by mouth daily.     . fluticasone furoate-vilanterol (BREO ELLIPTA) 100-25 MCG/INH AEPB Inhale 1 puff into the lungs daily.    . furosemide (LASIX) 80 MG tablet TAKE ONE TABLET BY MOUTH ONCE DAILY    . HYDROcodone-acetaminophen (NORCO) 7.5-325 MG tablet Take 1 tablet by mouth every 6 (six) hours as needed.    . magnesium oxide (MAG-OX) 400 MG tablet Take 400 mg 2 (two) times daily by mouth.    . metolazone (ZAROXOLYN) 5 MG tablet Take 1 tablet by mouth  as needed.     . metoprolol succinate (TOPROL-XL) 100 MG 24 hr tablet TAKE ONE TABLET BY MOUTH ONCE DAILY    . montelukast (SINGULAIR) 10 MG tablet Take 1 tablet by mouth at bedtime.    . nitroGLYCERIN (NITROSTAT) 0.4 MG SL tablet Place 0.4 mg every 5 (five) minutes as needed under the tongue for chest pain.    . OXYGEN Inhale 2 L into the lungs at bedtime.    . pantoprazole (PROTONIX) 40 MG tablet Take 40 mg by mouth 2 (two) times daily.     . potassium chloride SA (K-DUR,KLOR-CON) 20 MEQ tablet Take 1 tablet by mouth 1 day or 1 dose.    . primidone (MYSOLINE) 250 MG tablet TAKE 1.5 TABLET BY twice daily    . promethazine (PHENERGAN) 25 MG tablet Take 25 mg every 6 (six) hours as needed by mouth for nausea or vomiting.    Marland Kitchen QUEtiapine (SEROQUEL) 25 MG tablet Take 25 mg by mouth at bedtime.    . Roflumilast (DALIRESP) 250 MCG TABS Take 1 tablet by mouth daily.    Marland Kitchen saccharomyces boulardii (FLORASTOR) 250 MG capsule Take 1 capsule (250 mg total) by mouth 2 (two) times daily. 30 capsule 0  . sucralfate (CARAFATE) 1 g tablet Take 1 g by mouth 2 (two) times daily.     . SYMBICORT 160-4.5 MCG/ACT inhaler     . tamsulosin (FLOMAX) 0.4 MG CAPS capsule TAKE ONE CAPSULE BY MOUTH ONCE DAILY 30 MINUTES AFTER THE SAME MEAL EACH DAY    . theophylline (UNIPHYL) 400 MG 24 hr tablet Take 1 tablet by mouth daily.     Marland Kitchen tiotropium (SPIRIVA HANDIHALER) 18 MCG inhalation capsule INHALE ONE DOSE BY MOUTH ONCE DAILY    . tiZANidine (ZANAFLEX) 4 MG tablet Take 1 tablet by mouth every 8 (eight) hours as needed.    . vitamin B-12 (CYANOCOBALAMIN) 1000 MCG tablet Take 1,000 mcg by mouth daily.    Marland Kitchen torsemide (DEMADEX) 20 MG tablet Take by mouth.     No current facility-administered medications for this visit.      Marland Kitchen  PHYSICAL EXAMINATION: ECOG PERFORMANCE STATUS: 2 - Symptomatic, <50% confined to bed  Vitals:  06/04/20 1102  BP: 133/79  Pulse: 85  Resp: 18  Temp: (!) 97.5 F (36.4 C)  SpO2: 100%    Filed Weights   06/04/20 1102  Weight: 135 lb (61.2 kg)    Physical Exam Constitutional:      Comments: Thin cachectic appearing Caucasian male patient.  Patient in wheelchair.  Accompanied by his daughter.  HENT:     Head: Normocephalic and atraumatic.     Mouth/Throat:     Pharynx: No oropharyngeal exudate.  Eyes:     Pupils: Pupils are equal, round, and reactive to light.  Cardiovascular:     Rate and Rhythm: Normal rate and regular rhythm.  Pulmonary:     Effort: No respiratory distress.     Breath sounds: No wheezing.     Comments: Decreased breath sounds bilaterally.  No wheeze. Abdominal:     General: Bowel sounds are normal. There is no distension.     Palpations: Abdomen is soft. There is no mass.     Tenderness: There is no abdominal tenderness. There is no guarding or rebound.  Musculoskeletal:        General: No tenderness. Normal range of motion.     Cervical back: Normal range of motion and neck supple.  Skin:    General: Skin is warm.     Comments: Multiple chronic bruises noted on the extremities.  Neurological:     Mental Status: He is alert and oriented to person, place, and time.  Psychiatric:        Mood and Affect: Affect normal.      LABORATORY DATA:  I have reviewed the data as listed Lab Results  Component Value Date   WBC 10.7 (H) 01/13/2017   HGB 12.4 (L) 01/13/2017   HCT 35.3 (L) 01/13/2017   MCV 92.5 01/13/2017   PLT 278 01/13/2017   Recent Labs    05/11/20 0932  CREATININE 0.70    RADIOGRAPHIC STUDIES: I have personally reviewed the radiological images as listed and agreed with the findings in the report. CT CHEST W CONTRAST  Addendum Date: 05/11/2020   ADDENDUM REPORT: 05/11/2020 12:50 ADDENDUM: As noted in the body of the report, there is a new 2 cm mixed attenuation lesion in the anterior right liver. Abdominal MRI without with contrast would be the study of choice to further evaluate. This could also be further assessed at  the time of follow-up PET-CT. Electronically Signed   By: Misty Stanley M.D.   On: 05/11/2020 12:50   Result Date: 05/11/2020 CLINICAL DATA:  Non-small-cell lung cancer. EXAM: CT CHEST WITH CONTRAST TECHNIQUE: Multidetector CT imaging of the chest was performed during intravenous contrast administration. CONTRAST:  37mL OMNIPAQUE IOHEXOL 300 MG/ML  SOLN COMPARISON:  05/12/2019 FINDINGS: Cardiovascular: The heart size is normal. No substantial pericardial effusion. Coronary artery calcification is evident. Atherosclerotic calcification is noted in the wall of the thoracic aorta. Mediastinum/Nodes: No mediastinal lymphadenopathy. There is no hilar lymphadenopathy. The esophagus has normal imaging features. There is no axillary lymphadenopathy. Lungs/Pleura: Centrilobular and paraseptal emphysema evident. Oval lesion right upper lobe measured previously at 23 x 15 mm is stable at 23 x 13 mm today. This may reflect treatment scarring. A second nodular opacity in the superior segment of the right lower lobe (image 85/3) is clearly progressive in the interval measuring 1.6 x 1.1 cm today compared to 0.9 x 0.8 cm previously. No suspicious nodule or mass in the left lung No focal airspace consolidation. Insert no effusions Upper  Abdomen: 1.8 x 2.0 cm low-attenuation lesion in the anterior right liver may have some peripheral enhancement. This was not visible on the prior study or on an exam from 10/15/2018. 15 mm low-density lesion in the right kidney is likely a cyst. Abdominal aortic aneurysm incompletely visualize measuring up to 3.8 cm diameter. Musculoskeletal: No worrisome lytic or sclerotic osseous abnormality. T12 compression fracture is stable as is an upper thoracic compression deformity. IMPRESSION: 1. Interval progression of the nodular opacity in the superior segment of the right lower lobe. Given the interval growth, imaging features are concerning for neoplasm. PET-CT recommended to further evaluate. 2.  Aortic Atherosclerosis (ICD10-I70.0) and Emphysema (ICD10-J43.9). These results will be called to the ordering clinician or representative by the Radiologist Assistant, and communication documented in the PACS or [employer]. Electronically Signed: By: Misty Stanley M.D. On: 05/11/2020 12:45   NM PET Image Restag (PS) Skull Base To Thigh  Result Date: 05/23/2020 CLINICAL DATA:  Subsequent treatment strategy for non-small cell lung cancer of the right upper lobe. Radiation therapy 2 years ago. Recent appearance suggesting progression of the right upper lobe nodule. Also a mixed attenuation right hepatic lobe lesion was of concern on prior CT. EXAM: NUCLEAR MEDICINE PET SKULL BASE TO THIGH TECHNIQUE: 7.1 mCi F-18 FDG was injected intravenously. Full-ring PET imaging was performed from the skull base to thigh after the radiotracer. CT data was obtained and used for attenuation correction and anatomic localization. Fasting blood glucose: 73 mg/dl COMPARISON:  Multiple exams, including PET-CT of 11/12/2016 and CT chest of 05/11/2020 FINDINGS: Mediastinal blood pool activity: SUV max 2.2 Liver activity: SUV max N/A NECK: No significant abnormal hypermetabolic activity in this region. Incidental CT findings: Chronic bilateral maxillary sinusitis. Bilateral carotid atherosclerotic calcifications. CHEST: Superior segment right lower lobe nodule along the major fissure measures 1.4 cm in long axis on image 109/3 has a maximum SUV of 6.5, compatible with malignancy. The oval-shaped region nodularity further anteriorly in the right upper lobe on image 98/3 measures 1.3 cm short axis, and has lower grade metabolic activity with maximum SUV of 2.4, previously 4.2. Incidental CT findings: Severe emphysema. Coronary, aortic arch, and branch vessel atherosclerotic vascular disease. ABDOMEN/PELVIS: In the dome of the right hepatic lobe, a 2.9 cm hypodense lesion has maximum SUV of 15.6, compatible with active malignancy.  Porta hepatis lymph node 1.3 cm in short axis on image 157/3, maximum SUV 10.9, compatible with malignancy. Incidental CT findings: Infrarenal abdominal aortic aneurysm 3.9 cm in anterior-posterior dimension on image 179/3, previously 3.5 cm on 11/12/2016. Photopenic hypodense right renal lesion, likely a cyst. Descending and sigmoid colon diverticulosis. SKELETON: Irregular lucency and sclerosis in the right anterior third rib near the bandlike and oval-shaped density in the right upper lobe that has the low metabolic activity. This rib lesion likewise has mildly accentuated metabolic activity and potentially mild subpectoral thickening adjacent to the rib lesion, maximum SUV 2.3. Given the low-grade activity, this could represent chronic findings related to radiation necrosis. Incidental CT findings: Healing benign-appearing fracture of the left posterior ninth rib. Postoperative findings in the lumbar spine. Chronic wedging of T12 and chronic inferior endplate compression fracture at T5. IMPRESSION: 1. Unfortunately, the oval-shaped nodule in the superior segment right lower lobe abutting the major fissure is hypermetabolic compatible with malignancy. Also the lesion in the dome of the right hepatic lobe is highly hypermetabolic, and there is a hypermetabolic and enlarged porta hepatis lymph nodes compatible with active malignancy. 2. Low-grade metabolic activity in  the nodule in the right upper lobe likely representing prior treated lesion. Given the current low-grade activity, some component of residual malignancy is not excluded. 3. Accentuated activity along a mixed lucent and sclerotic lesion in the right anterior third rib with some adjacent soft tissue prominence the subpectoral region. This low-grade activity could be from chronic inflammatory findings related to radiation necrosis, with low-grade malignancy as a differential diagnostic consideration. Surveillance is likely warranted. 4. Infrarenal  abdominal aortic aneurysm, 3.9 cm in diameter today and previously 3.5 cm in diameter on 11/12/2016. Recommend followup by [place] in 2 years. This recommendation follows ACR consensus guidelines: White Paper of the ACR Incidental Findings Committee II on Vascular Findings. J Am Coll Radiol 2013; 10:789-794. 5. Other imaging findings of potential clinical significance: Aortic Atherosclerosis (ICD10-I70.0) and Emphysema (ICD10-J43.9). Chronic bilateral maxillary sinusitis. Descending and sigmoid colon diverticulosis. Healing left posterior ninth rib fracture. Electronically Signed   By: Van Clines M.D.   On: 05/23/2020 16:21    ASSESSMENT & PLAN:   Cancer of upper lobe of right lung (HCC) #History of stage I lung cancer-s/p SBRT 2018; currently with new right lower lobe lesion; liver lesion and also porta hepatis lymphadenopathy concerning for metastatic disease.  Patient awaiting biopsy on June 23rd.  Will also need MRI brain-order at next visit.  #Had a long discussion the patient and his daughter regarding the concerns for progressive malignancy/metastatic disease.  However a biopsy will confirm our concerns; also the histopathology.  Would recommend NGS on the liver sample.  #Discussed option of treatment including chemo immunotherapy x4 cycles followed by immunotherapy in general.  However final recommendations based upon final pathology results.  # Severe COPD/ chronic back pain-borderline performance status.  See discussion below  # palliative care evaluation: Introduced palliative care philosophy and services. I discussed the need for palliative care evaluation/symptom management to help quality of life in the context of incurable disease.  Patient is interested; will make referral-at next visit.  Thank you Dr.Chrystal for allowing me to participate in the care of your pleasant patient. Please do not hesitate to contact me with questions or concerns in the interim.  # DISPOSITION: #  labs- cbc/cmp/cea; PT/PTT [please order] # follow up on 6/29-9:00am; MD; No labs-Dr.B  # I reviewed the blood work- with the patient in detail; also reviewed the imaging independently [as summarized above]; and with the patient in detail.     All questions were answered. The patient knows to call the clinic with any problems, questions or concerns.    Cammie Sickle, MD 06/04/2020 12:04 PM

## 2020-06-05 ENCOUNTER — Other Ambulatory Visit: Payer: Self-pay | Admitting: Radiology

## 2020-06-05 LAB — CEA: CEA: 6.7 ng/mL — ABNORMAL HIGH (ref 0.0–4.7)

## 2020-06-06 ENCOUNTER — Other Ambulatory Visit: Payer: Self-pay

## 2020-06-06 ENCOUNTER — Ambulatory Visit
Admission: RE | Admit: 2020-06-06 | Discharge: 2020-06-06 | Disposition: A | Discharge status: Home / Self Care | Payer: Medicare Other | Source: Ambulatory Visit | Attending: Radiation Oncology | Admitting: Radiation Oncology

## 2020-06-06 DIAGNOSIS — Z79899 Other long term (current) drug therapy: Secondary | ICD-10-CM | POA: Diagnosis not present

## 2020-06-06 DIAGNOSIS — Z8249 Family history of ischemic heart disease and other diseases of the circulatory system: Secondary | ICD-10-CM | POA: Insufficient documentation

## 2020-06-06 DIAGNOSIS — I2510 Atherosclerotic heart disease of native coronary artery without angina pectoris: Secondary | ICD-10-CM | POA: Insufficient documentation

## 2020-06-06 DIAGNOSIS — Z87891 Personal history of nicotine dependence: Secondary | ICD-10-CM | POA: Insufficient documentation

## 2020-06-06 DIAGNOSIS — J449 Chronic obstructive pulmonary disease, unspecified: Secondary | ICD-10-CM | POA: Insufficient documentation

## 2020-06-06 DIAGNOSIS — C3411 Malignant neoplasm of upper lobe, right bronchus or lung: Secondary | ICD-10-CM | POA: Diagnosis not present

## 2020-06-06 DIAGNOSIS — I252 Old myocardial infarction: Secondary | ICD-10-CM | POA: Diagnosis not present

## 2020-06-06 DIAGNOSIS — Z9981 Dependence on supplemental oxygen: Secondary | ICD-10-CM | POA: Insufficient documentation

## 2020-06-06 DIAGNOSIS — I10 Essential (primary) hypertension: Secondary | ICD-10-CM | POA: Insufficient documentation

## 2020-06-06 DIAGNOSIS — Z7982 Long term (current) use of aspirin: Secondary | ICD-10-CM | POA: Diagnosis not present

## 2020-06-06 DIAGNOSIS — K769 Liver disease, unspecified: Secondary | ICD-10-CM | POA: Diagnosis present

## 2020-06-06 DIAGNOSIS — C787 Secondary malignant neoplasm of liver and intrahepatic bile duct: Secondary | ICD-10-CM | POA: Diagnosis not present

## 2020-06-06 LAB — CBC
HCT: 40 % (ref 39.0–52.0)
Hemoglobin: 13.9 g/dL (ref 13.0–17.0)
MCH: 33 pg (ref 26.0–34.0)
MCHC: 34.8 g/dL (ref 30.0–36.0)
MCV: 95 fL (ref 80.0–100.0)
Platelets: 243 10*3/uL (ref 150–400)
RBC: 4.21 MIL/uL — ABNORMAL LOW (ref 4.22–5.81)
RDW: 12.9 % (ref 11.5–15.5)
WBC: 11.6 10*3/uL — ABNORMAL HIGH (ref 4.0–10.5)
nRBC: 0 % (ref 0.0–0.2)

## 2020-06-06 LAB — PROTIME-INR
INR: 1 (ref 0.8–1.2)
Prothrombin Time: 13 seconds (ref 11.4–15.2)

## 2020-06-06 LAB — APTT: aPTT: 26 seconds (ref 24–36)

## 2020-06-06 MED ORDER — SODIUM CHLORIDE 0.9 % IV SOLN: INTRAVENOUS | Status: DC

## 2020-06-06 MED ORDER — MIDAZOLAM HCL 2 MG/2ML IJ SOLN
INTRAMUSCULAR | Status: AC
  Filled 2020-06-06: qty 2

## 2020-06-06 MED ORDER — MIDAZOLAM HCL 2 MG/2ML IJ SOLN
INTRAMUSCULAR | Status: AC | PRN
  Administered 2020-06-06 (×2): 0.5 mg via INTRAVENOUS

## 2020-06-06 MED ORDER — OXYCODONE-ACETAMINOPHEN 5-325 MG PO TABS: 1.0000 | ORAL_TABLET | Freq: Once | ORAL | Status: DC

## 2020-06-06 MED ORDER — FENTANYL CITRATE (PF) 100 MCG/2ML IJ SOLN
INTRAMUSCULAR | Status: AC | PRN
  Administered 2020-06-06 (×2): 25 ug via INTRAVENOUS

## 2020-06-06 MED ORDER — FENTANYL CITRATE (PF) 100 MCG/2ML IJ SOLN: 50.0000 ug | INTRAMUSCULAR | Status: AC

## 2020-06-06 MED ORDER — FENTANYL CITRATE (PF) 100 MCG/2ML IJ SOLN
INTRAMUSCULAR | Status: AC
  Administered 2020-06-06: 50 ug via INTRAVENOUS
  Filled 2020-06-06: qty 2

## 2020-06-06 MED ORDER — FENTANYL CITRATE (PF) 100 MCG/2ML IJ SOLN
INTRAMUSCULAR | Status: AC
  Filled 2020-06-06: qty 2

## 2020-06-06 NOTE — Procedures (Signed)
Pre Procedure Dx: Hypermetabolic liver lesion Post Procedural Dx: Same  Technically successful US guided biopsy of indeterminate hypermetabolic liver lesion.  EBL: None  No immediate complications.   Ronny Bacon, MD Pager #: [phone]

## 2020-06-06 NOTE — Consult Note (Signed)
Chief Complaint: Hypermetabolic liver lesion  Referring Physician(s): Chrystal,Glenn  Patient Status: [facility] - Out-pt  History of Present Illness: Ryan Jarvis is a 69 y.o. male with past medical history and lung cancer, found to have an indeterminate lesion the right lobe of the liver on surveillance chest CT performed 05/11/2020 found to be hypermetabolic on PET/CT performed 05/23/2020.  As such, patient presents today for US-guided hepatic lesion biopsy for tissue diagnostic purposes.  The patient is accompanied by his daughter though serves as his own historian.  Patient admits to unchanged chronic/baseline shortness of breath.    He is otherwise without complaint.  Specifically, no change in his his appetite or energy level.  No chest pain, fever or chills.  From a functional status, the patient ambulates with a walker and utilizes a chair for bathing purposes.  Past Medical History:  Diagnosis Date  . Anemia   . Anxiety   . Arthritis   . Cancer ([place])   . COPD (chronic obstructive pulmonary disease) ([place])   . Coronary artery disease    04/1996 inferior MI. Cardiac cath; anterolateral and inferior hypokinesis. The left main was normal and mid LAD had a 50% lesion, D2 was 50-75%, mid circumflex was 95% adn there was a totally occluded RCA.  There was distal filling of the RCA from br  . Dyspnea   . Emphysema of lung ([place])   . GERD (gastroesophageal reflux disease)   . Hiatal hernia   . Hypertension   . Myocardial infarction ([place])   . PUD (peptic ulcer disease)   . Stroke ([place])   . Thoracic aortic aneurysm without rupture ([place])   . Tremors of nervous system   . Vitamin B12 deficiency     Past Surgical History:  Procedure Laterality Date  . adenomatous polyps    . BACK SURGERY    . COLONOSCOPY WITH PROPOFOL N/A 06/28/2018   Procedure: COLONOSCOPY WITH PROPOFOL;  Surgeon: Manya Silvas, MD;  Location: [facility] ENDOSCOPY;  Service: Endoscopy;  Laterality: N/A;  .  CORONARY ANGIOPLASTY    . ESOPHAGOGASTRODUODENOSCOPY (EGD) WITH PROPOFOL N/A 06/28/2018   Procedure: ESOPHAGOGASTRODUODENOSCOPY (EGD) WITH PROPOFOL;  Surgeon: Manya Silvas, MD;  Location: [facility] ENDOSCOPY;  Service: Endoscopy;  Laterality: N/A;  . STENT REMOVAL      Allergies: Zolpidem, Belladonna alkaloids, and Sulfa antibiotics  Medications: Prior to Admission medications   Medication Sig Start Date End Date Taking? Authorizing Provider  albuterol (VENTOLIN HFA) 108 (90 Base) MCG/ACT inhaler INHALE 2 PUFFS BY MOUTH EVERY 6 HOURS AS NEEDED FOR WHEEZING 04/08/17  Yes [provider]  ALPRAZolam (XANAX) 0.5 MG tablet TAKE ONE TABLET BY MOUTH THREE TIMES DAILY AS NEEDED FOR ANXIETY 03/24/17  Yes [provider]  aspirin 81 MG tablet Take 81 mg daily by mouth.   Yes [provider]  acetaminophen (TYLENOL) 325 MG tablet Take 2 tablets (650 mg total) by mouth every 4 (four) hours as needed for mild pain (temp > 101.5). 01/13/17   Gouru, Illene Silver, MD  atorvastatin (LIPITOR) 40 MG tablet TAKE ONE TABLET BY MOUTH ONCE DAILY 02/11/17   [provider]  azelastine (ASTELIN) 0.1 % nasal spray Place 1 spray into both nostrils 2 (two) times daily. Use in each nostril as directed    [provider]  benzonatate (TESSALON) 100 MG capsule Take 200 mg by mouth 3 (three) times daily as needed for cough.    [provider]  Cholecalciferol (VITAMIN D-1000 MAX ST) 1000 units  tablet Take 1 tablet by mouth 1 day or 1 dose.    [provider]  diphenhydramine-acetaminophen (TYLENOL PM) 25-500 MG TABS tablet Take 1 tablet by mouth at bedtime as needed.    [provider]  doxepin (SINEQUAN) 10 MG capsule Take 10 mg by mouth.    [provider]  DULoxetine (CYMBALTA) 30 MG capsule Take 3 capsules by mouth daily.  09/11/16   [provider]  fluticasone furoate-vilanterol (BREO ELLIPTA) 100-25 MCG/INH AEPB Inhale 1 puff into the lungs  daily.    [provider]  furosemide (LASIX) 80 MG tablet TAKE ONE TABLET BY MOUTH ONCE DAILY 06/12/16   [provider]  HYDROcodone-acetaminophen (NORCO) 7.5-325 MG tablet Take 1 tablet by mouth every 6 (six) hours as needed. 12/15/16   [provider]  magnesium oxide (MAG-OX) 400 MG tablet Take 400 mg 2 (two) times daily by mouth.    [provider]  metolazone (ZAROXOLYN) 5 MG tablet Take 1 tablet by mouth as needed.  09/11/16   [provider]  metoprolol succinate (TOPROL-XL) 100 MG 24 hr tablet TAKE ONE TABLET BY MOUTH ONCE DAILY 06/12/16   [provider]  montelukast (SINGULAIR) 10 MG tablet Take 1 tablet by mouth at bedtime. 11/04/19 11/03/20  [provider]  nitroGLYCERIN (NITROSTAT) 0.4 MG SL tablet Place 0.4 mg every 5 (five) minutes as needed under the tongue for chest pain.    [provider]  OXYGEN Inhale 2 L into the lungs at bedtime.    [provider]  pantoprazole (PROTONIX) 40 MG tablet Take 40 mg by mouth 2 (two) times daily.  12/31/15   [provider]  potassium chloride SA (K-DUR,KLOR-CON) 20 MEQ tablet Take 1 tablet by mouth 1 day or 1 dose. 09/11/16   [provider]  primidone (MYSOLINE) 250 MG tablet TAKE 1.5 TABLET BY twice daily 04/17/16   [provider]  promethazine (PHENERGAN) 25 MG tablet Take 25 mg every 6 (six) hours as needed by mouth for nausea or vomiting.    [provider]  QUEtiapine (SEROQUEL) 25 MG tablet Take 25 mg by mouth at bedtime.    [provider]  Roflumilast (DALIRESP) 250 MCG TABS Take 1 tablet by mouth daily. 03/15/20   [provider]  saccharomyces boulardii (FLORASTOR) 250 MG capsule Take 1 capsule (250 mg total) by mouth 2 (two) times daily. 01/13/17   Gouru, Illene Silver, MD  sucralfate (CARAFATE) 1 g tablet Take 1 g by mouth 2 (two) times daily.  12/31/15   [provider]  SYMBICORT 160-4.5 MCG/ACT inhaler   06/03/17   [provider]  tamsulosin (FLOMAX) 0.4 MG CAPS capsule TAKE ONE CAPSULE BY MOUTH ONCE DAILY 30 MINUTES AFTER THE SAME MEAL EACH DAY 06/12/16   [provider]  theophylline (UNIPHYL) 400 MG 24 hr tablet Take 1 tablet by mouth daily.  09/11/16   [provider]  tiotropium (SPIRIVA HANDIHALER) 18 MCG inhalation capsule INHALE ONE DOSE BY MOUTH ONCE DAILY 11/05/15   [provider]  tiZANidine (ZANAFLEX) 4 MG tablet Take 1 tablet by mouth every 8 (eight) hours as needed. 08/17/19   [provider]  torsemide (DEMADEX) 20 MG tablet Take by mouth. 06/02/17 05/31/20  [provider]  vitamin B-12 (CYANOCOBALAMIN) 1000 MCG tablet Take 1,000 mcg by mouth daily.    [provider]     Family History  Problem Relation Age of Onset  . Congestive Heart Failure Mother   .  Congestive Heart Failure Father     Social History   Socioeconomic History  . Marital status: Married    Spouse name: Not on file  . Number of children: Not on file  . Years of education: Not on file  . Highest education level: Not on file  Occupational History  . Not on file  Tobacco Use  . Smoking status: Former Smoker    Packs/day: 1.00    Years: 49.00    Pack years: 49.00    Types: Cigarettes  . Smokeless tobacco: Never Used  . Tobacco comment: quit smoking in 2013  Substance and Sexual Activity  . Alcohol use: Yes    Comment: takes sips of liquor during day/night  for cough  . Drug use: No  . Sexual activity: Not on file  Other Topics Concern  . 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    Social Determinants of Health   Financial Resource Strain:   . Difficulty of Paying Living Expenses:   Food Insecurity:   . Worried About Charity fundraiser in the Last Year:   . Arboriculturist in the Last Year:   Transportation  Needs:   . Film/video editor (Medical):   Marland Kitchen Lack of Transportation (Non-Medical):   Physical Activity:   . Days of Exercise per Week:   . Minutes of Exercise per Session:   Stress:   . Feeling of Stress :   Social Connections:   . Frequency of Communication with Friends and Family:   . Frequency of Social Gatherings with Friends and Family:   . Attends Religious Services:   . Active Member of Clubs or Organizations:   . Attends Archivist Meetings:   Marland Kitchen Marital Status:     ECOG Status: 2 - Symptomatic, <50% confined to bed  Review of Systems: A 12 point ROS discussed and pertinent positives are indicated in the HPI above.  All other systems are negative.  Review of Systems  Vital Signs: BP 138/87   Pulse 78   Temp 97.6 F (36.4 C) (Oral)   Resp 20   Ht 5\' 6"  (1.676 m)   Wt 59 kg   SpO2 95%   BMI 20.98 kg/m   Physical Exam  Imaging: CT CHEST W CONTRAST  Addendum Date: 05/11/2020   ADDENDUM REPORT: 05/11/2020 12:50 ADDENDUM: As noted in the body of the report, there is a new 2 cm mixed attenuation lesion in the anterior right liver. Abdominal MRI without with contrast would be the study of choice to further evaluate. This could also be further assessed at the time of follow-up PET-CT. Electronically Signed   By: Misty Stanley M.D.   On: 05/11/2020 12:50   Result Date: 05/11/2020 CLINICAL DATA:  Non-small-cell lung cancer. EXAM: CT CHEST WITH CONTRAST TECHNIQUE: Multidetector CT imaging of the chest was performed during intravenous contrast administration. CONTRAST:  73mL OMNIPAQUE IOHEXOL 300 MG/ML  SOLN COMPARISON:  05/12/2019 FINDINGS: Cardiovascular: The heart size is normal. No substantial pericardial effusion. Coronary artery calcification is evident. Atherosclerotic calcification is noted in the wall of the thoracic aorta. Mediastinum/Nodes: No mediastinal lymphadenopathy. There is no hilar lymphadenopathy. The esophagus has normal imaging features. There  is no axillary lymphadenopathy. Lungs/Pleura: Centrilobular and paraseptal emphysema evident. Oval lesion right upper lobe measured previously at 23 x 15 mm is stable at  23 x 13 mm today. This may reflect treatment scarring. A second nodular opacity in the superior segment of the right lower lobe (image 85/3) is clearly progressive in the interval measuring 1.6 x 1.1 cm today compared to 0.9 x 0.8 cm previously. No suspicious nodule or mass in the left lung No focal airspace consolidation. Insert no effusions Upper Abdomen: 1.8 x 2.0 cm low-attenuation lesion in the anterior right liver may have some peripheral enhancement. This was not visible on the prior study or on an exam from 10/15/2018. 15 mm low-density lesion in the right kidney is likely a cyst. Abdominal aortic aneurysm incompletely visualize measuring up to 3.8 cm diameter. Musculoskeletal: No worrisome lytic or sclerotic osseous abnormality. T12 compression fracture is stable as is an upper thoracic compression deformity. IMPRESSION: 1. Interval progression of the nodular opacity in the superior segment of the right lower lobe. Given the interval growth, imaging features are concerning for neoplasm. PET-CT recommended to further evaluate. 2. Aortic Atherosclerosis (ICD10-I70.0) and Emphysema (ICD10-J43.9). These results will be called to the ordering clinician or representative by the Radiologist Assistant, and communication documented in the PACS or [employer]. Electronically Signed: By: Misty Stanley M.D. On: 05/11/2020 12:45   NM PET Image Restag (PS) Skull Base To Thigh  Result Date: 05/23/2020 CLINICAL DATA:  Subsequent treatment strategy for non-small cell lung cancer of the right upper lobe. Radiation therapy 2 years ago. Recent appearance suggesting progression of the right upper lobe nodule. Also a mixed attenuation right hepatic lobe lesion was of concern on prior CT. EXAM: NUCLEAR MEDICINE PET SKULL BASE TO THIGH TECHNIQUE: 7.1 mCi  F-18 FDG was injected intravenously. Full-ring PET imaging was performed from the skull base to thigh after the radiotracer. CT data was obtained and used for attenuation correction and anatomic localization. Fasting blood glucose: 73 mg/dl COMPARISON:  Multiple exams, including PET-CT of 11/12/2016 and CT chest of 05/11/2020 FINDINGS: Mediastinal blood pool activity: SUV max 2.2 Liver activity: SUV max N/A NECK: No significant abnormal hypermetabolic activity in this region. Incidental CT findings: Chronic bilateral maxillary sinusitis. Bilateral carotid atherosclerotic calcifications. CHEST: Superior segment right lower lobe nodule along the major fissure measures 1.4 cm in long axis on image 109/3 has a maximum SUV of 6.5, compatible with malignancy. The oval-shaped region nodularity further anteriorly in the right upper lobe on image 98/3 measures 1.3 cm short axis, and has lower grade metabolic activity with maximum SUV of 2.4, previously 4.2. Incidental CT findings: Severe emphysema. Coronary, aortic arch, and branch vessel atherosclerotic vascular disease. ABDOMEN/PELVIS: In the dome of the right hepatic lobe, a 2.9 cm hypodense lesion has maximum SUV of 15.6, compatible with active malignancy. Porta hepatis lymph node 1.3 cm in short axis on image 157/3, maximum SUV 10.9, compatible with malignancy. Incidental CT findings: Infrarenal abdominal aortic aneurysm 3.9 cm in anterior-posterior dimension on image 179/3, previously 3.5 cm on 11/12/2016. Photopenic hypodense right renal lesion, likely a cyst. Descending and sigmoid colon diverticulosis. SKELETON: Irregular lucency and sclerosis in the right anterior third rib near the bandlike and oval-shaped density in the right upper lobe that has the low metabolic activity. This rib lesion likewise has mildly accentuated metabolic activity and potentially mild subpectoral thickening adjacent to the rib lesion, maximum SUV 2.3. Given the low-grade activity, this  could represent chronic findings related to radiation necrosis. Incidental CT findings: Healing benign-appearing fracture of the left posterior ninth rib. Postoperative findings in the lumbar spine. Chronic wedging of T12 and chronic inferior  endplate compression fracture at T5. IMPRESSION: 1. Unfortunately, the oval-shaped nodule in the superior segment right lower lobe abutting the major fissure is hypermetabolic compatible with malignancy. Also the lesion in the dome of the right hepatic lobe is highly hypermetabolic, and there is a hypermetabolic and enlarged porta hepatis lymph nodes compatible with active malignancy. 2. Low-grade metabolic activity in the nodule in the right upper lobe likely representing prior treated lesion. Given the current low-grade activity, some component of residual malignancy is not excluded. 3. Accentuated activity along a mixed lucent and sclerotic lesion in the right anterior third rib with some adjacent soft tissue prominence the subpectoral region. This low-grade activity could be from chronic inflammatory findings related to radiation necrosis, with low-grade malignancy as a differential diagnostic consideration. Surveillance is likely warranted. 4. Infrarenal abdominal aortic aneurysm, 3.9 cm in diameter today and previously 3.5 cm in diameter on 11/12/2016. Recommend followup by [place] in 2 years. This recommendation follows ACR consensus guidelines: White Paper of the ACR Incidental Findings Committee II on Vascular Findings. J Am Coll Radiol 2013; 10:789-794. 5. Other imaging findings of potential clinical significance: Aortic Atherosclerosis (ICD10-I70.0) and Emphysema (ICD10-J43.9). Chronic bilateral maxillary sinusitis. Descending and sigmoid colon diverticulosis. Healing left posterior ninth rib fracture. Electronically Signed   By: Van Clines M.D.   On: 05/23/2020 16:21    Labs:  CBC: Recent Labs    06/04/20 1204 06/06/20 0943  WBC 9.1 11.6*  HGB 14.0  13.9  HCT 41.3 40.0  PLT 261 243    COAGS: Recent Labs    06/04/20 1204 06/06/20 0943  INR 1.0 1.0  APTT 27 26    BMP: Recent Labs    05/11/20 0932 06/04/20 1204  NA  --  133*  K  --  3.7  CL  --  84*  CO2  --  34*  GLUCOSE  --  91  BUN  --  25*  CALCIUM  --  9.0  CREATININE 0.70 0.92  GFRNONAA  --  >60  GFRAA  --  >60    LIVER FUNCTION TESTS: Recent Labs    06/04/20 1204  BILITOT 0.8  AST 26  ALT 29  ALKPHOS 115  PROT 7.9  ALBUMIN 4.3    TUMOR MARKERS: No results for input(s): AFPTM, CEA, CA199, CHROMGRNA in the last 8760 hours.  Assessment and Plan:  Ryan Jarvis is a 69 y.o. male with past medical history and lung cancer, found to have an indeterminate lesion the right lobe of the liver on surveillance chest CT performed 05/11/2020 found to be hypermetabolic on PET/CT performed 05/23/2020.  As such, patient presents today for US-guided hepatic lesion biopsy for tissue diagnostic purposes.   Risks and benefits of US guided liver lesion biopsy was discussed with the patient and/or patient's family including, but not limited to bleeding, infection, damage to adjacent structures or low yield requiring additional tests.  All of the questions were answered and there is agreement to proceed.  Consent signed and in chart.  Thank you for this interesting consult.  I greatly enjoyed meeting JARIAN LONGORIA and look forward to participating in their care.  A copy of this report was sent to the requesting provider on this date.  Electronically Signed: Sandi Mariscal, MD 06/06/2020, 10:28 AM   I spent a total of 15 Minutes in face to face in clinical consultation, greater than 50% of which was counseling/coordinating care for US guided liver lesion Bx

## 2020-06-08 ENCOUNTER — Other Ambulatory Visit: Payer: Self-pay | Admitting: Pathology

## 2020-06-08 LAB — SURGICAL PATHOLOGY

## 2020-06-12 ENCOUNTER — Other Ambulatory Visit: Payer: Self-pay

## 2020-06-12 ENCOUNTER — Encounter: Payer: Self-pay | Admitting: *Deleted

## 2020-06-12 ENCOUNTER — Inpatient Hospital Stay (HOSPITAL_BASED_OUTPATIENT_CLINIC_OR_DEPARTMENT_OTHER): Payer: Medicare Other | Admitting: Internal Medicine

## 2020-06-12 ENCOUNTER — Inpatient Hospital Stay (HOSPITAL_BASED_OUTPATIENT_CLINIC_OR_DEPARTMENT_OTHER): Payer: Medicare Other | Admitting: Hospice and Palliative Medicine

## 2020-06-12 ENCOUNTER — Encounter: Payer: Self-pay | Admitting: Internal Medicine

## 2020-06-12 ENCOUNTER — Other Ambulatory Visit: Payer: Self-pay | Admitting: *Deleted

## 2020-06-12 DIAGNOSIS — C3411 Malignant neoplasm of upper lobe, right bronchus or lung: Secondary | ICD-10-CM

## 2020-06-12 DIAGNOSIS — Z515 Encounter for palliative care: Secondary | ICD-10-CM

## 2020-06-12 NOTE — Progress Notes (Signed)
[place]  Telephone:([phone] [phone]   Name: Ryan Jarvis Date: 06/12/2020 MRN: [id number]  DOB: 1951/07/30  Patient Care Team: Adin Hector, MD as PCP - General (Internal Medicine) Telford Nab, RN as Oncology Nurse Navigator    REASON FOR CONSULTATION: WISDOM Ryan Jarvis is a 69 y.o. male with multiple medical problems including severe COPD, CAD status post stenting, history of CVA, and history of stage I adenocarcinoma of the lung in 2018.  Patient was not felt to be a surgical candidate so he underwent XRT.  He had recurrence in June 2021 with PET scan revealing right lower lobe mass with liver mets and hypermetabolic porta hepatis lymph nodes.  Liver biopsy on 06/06/2020 + for poorly differentiated adenocarcinoma of the lung.  Palliative care was consulted help address goals and manage ongoing symptoms.  SOCIAL HISTORY:     reports that he has quit smoking. His smoking use included cigarettes. He has a 49.00 pack-year smoking history. He has never used smokeless tobacco. He reports current alcohol use. He reports that he does not use drugs.   Patient is married and lives at home with his wife and daughter.   Patient's wife also has lung cancer.  Patient's niece is a Marine scientist, Ryan Jarvis. Patient used to work as a Psychologist, sport and exercise.   ADVANCE DIRECTIVES:  Not on file  CODE STATUS: DNR  PAST MEDICAL HISTORY: Past Medical History:  Diagnosis Date  . Anemia   . Anxiety   . Arthritis   . Cancer ([place])   . COPD (chronic obstructive pulmonary disease) ([place])   . Coronary artery disease    04/1996 inferior MI. Cardiac cath; anterolateral and inferior hypokinesis. The left main was normal and mid LAD had a 50% lesion, D2 was 50-75%, mid circumflex was 95% adn there was a totally occluded RCA.  There was distal filling of the RCA from br  . Dyspnea   . Emphysema of lung ([place])   . GERD (gastroesophageal reflux disease)   .  Hiatal hernia   . Hypertension   . Myocardial infarction ([place])   . PUD (peptic ulcer disease)   . Stroke ([place])   . Thoracic aortic aneurysm without rupture ([place])   . Tremors of nervous system   . Vitamin B12 deficiency     PAST SURGICAL HISTORY:  Past Surgical History:  Procedure Laterality Date  . adenomatous polyps    . BACK SURGERY    . COLONOSCOPY WITH PROPOFOL N/A 06/28/2018   Procedure: COLONOSCOPY WITH PROPOFOL;  Surgeon: Manya Silvas, MD;  Location: [facility] ENDOSCOPY;  Service: Endoscopy;  Laterality: N/A;  . CORONARY ANGIOPLASTY    . ESOPHAGOGASTRODUODENOSCOPY (EGD) WITH PROPOFOL N/A 06/28/2018   Procedure: ESOPHAGOGASTRODUODENOSCOPY (EGD) WITH PROPOFOL;  Surgeon: Manya Silvas, MD;  Location: [facility] ENDOSCOPY;  Service: Endoscopy;  Laterality: N/A;  . STENT REMOVAL      HEMATOLOGY/ONCOLOGY HISTORY:  Oncology History Overview Note  # 2018- March- Right Upper lobe [Stage I] s/p Bx- Adenocarcinoma- acinar pattern s/p SBRT  # June 2021- PET- Progression/recurrence-  [Unfortunately, the oval-shaped nodule in the superior segment right lower lobe abutting the major fissure is hypermetabolic compatible with malignancy. Also the lesion in the dome of the right hepatic lobe is highly hypermetabolic, and there is a hypermetabolic and enlarged porta hepatis lymph nodes compatible with active Malignancy].   # CAD s/p stent; Hx of tremors [Dr.Shah]  # NGS/MOLECULAR TESTS:    # PALLIATIVE  CARE EVALUATION:  # PAIN MANAGEMENT:    DIAGNOSIS:   STAGE:         ;  GOALS:  CURRENT/MOST RECENT THERAPY :     Cancer of upper lobe of right lung ([place])  03/02/2017 Initial Diagnosis   Cancer of upper lobe of right lung ([facility])     ALLERGIES:  is allergic to zolpidem, belladonna alkaloids, and sulfa antibiotics.  MEDICATIONS:  Current Outpatient Medications  Medication Sig Dispense Refill  . acetaminophen (TYLENOL) 325 MG tablet Take 2 tablets (650 mg total) by mouth every  4 (four) hours as needed for mild pain (temp > 101.5).    Marland Kitchen albuterol (VENTOLIN HFA) 108 (90 Base) MCG/ACT inhaler INHALE 2 PUFFS BY MOUTH EVERY 6 HOURS AS NEEDED FOR WHEEZING    . ALPRAZolam (XANAX) 0.5 MG tablet TAKE ONE TABLET BY MOUTH THREE TIMES DAILY AS NEEDED FOR ANXIETY    . aspirin 81 MG tablet Take 81 mg daily by mouth.    Marland Kitchen atorvastatin (LIPITOR) 40 MG tablet TAKE ONE TABLET BY MOUTH ONCE DAILY    . azelastine (ASTELIN) 0.1 % nasal spray Place 1 spray into both nostrils 2 (two) times daily. Use in each nostril as directed    . benzonatate (TESSALON) 100 MG capsule Take 200 mg by mouth 3 (three) times daily as needed for cough.    . Cholecalciferol (VITAMIN D-1000 MAX ST) 1000 units tablet Take 1 tablet by mouth 1 day or 1 dose.    . diphenhydramine-acetaminophen (TYLENOL PM) 25-500 MG TABS tablet Take 1 tablet by mouth at bedtime as needed.    . doxepin (SINEQUAN) 10 MG capsule Take 10 mg by mouth.    . DULoxetine (CYMBALTA) 30 MG capsule Take 3 capsules by mouth daily.     . fluticasone furoate-vilanterol (BREO ELLIPTA) 100-25 MCG/INH AEPB Inhale 1 puff into the lungs daily.    . furosemide (LASIX) 80 MG tablet TAKE ONE TABLET BY MOUTH ONCE DAILY    . HYDROcodone-acetaminophen (NORCO) 7.5-325 MG tablet Take 1 tablet by mouth every 6 (six) hours as needed.    . magnesium oxide (MAG-OX) 400 MG tablet Take 400 mg 2 (two) times daily by mouth.    . metolazone (ZAROXOLYN) 5 MG tablet Take 1 tablet by mouth as needed.     . metoprolol succinate (TOPROL-XL) 100 MG 24 hr tablet TAKE ONE TABLET BY MOUTH ONCE DAILY    . montelukast (SINGULAIR) 10 MG tablet Take 1 tablet by mouth at bedtime.    . nitroGLYCERIN (NITROSTAT) 0.4 MG SL tablet Place 0.4 mg every 5 (five) minutes as needed under the tongue for chest pain.    . OXYGEN Inhale 2 L into the lungs at bedtime.    . pantoprazole (PROTONIX) 40 MG tablet Take 40 mg by mouth 2 (two) times daily.     . potassium chloride SA (K-DUR,KLOR-CON) 20  MEQ tablet Take 1 tablet by mouth 1 day or 1 dose.    . primidone (MYSOLINE) 250 MG tablet TAKE 1.5 TABLET BY twice daily    . promethazine (PHENERGAN) 25 MG tablet Take 25 mg every 6 (six) hours as needed by mouth for nausea or vomiting.    Marland Kitchen QUEtiapine (SEROQUEL) 25 MG tablet Take 25 mg by mouth at bedtime.    . Roflumilast (DALIRESP) 250 MCG TABS Take 1 tablet by mouth daily.    Marland Kitchen saccharomyces boulardii (FLORASTOR) 250 MG capsule Take 1 capsule (250 mg total) by mouth 2 (two) times daily.  30 capsule 0  . sucralfate (CARAFATE) 1 g tablet Take 1 g by mouth 2 (two) times daily.     . SYMBICORT 160-4.5 MCG/ACT inhaler     . tamsulosin (FLOMAX) 0.4 MG CAPS capsule TAKE ONE CAPSULE BY MOUTH ONCE DAILY 30 MINUTES AFTER THE SAME MEAL EACH DAY    . theophylline (UNIPHYL) 400 MG 24 hr tablet Take 1 tablet by mouth daily.     Marland Kitchen tiotropium (SPIRIVA HANDIHALER) 18 MCG inhalation capsule INHALE ONE DOSE BY MOUTH ONCE DAILY    . tiZANidine (ZANAFLEX) 4 MG tablet Take 1 tablet by mouth every 8 (eight) hours as needed.    . torsemide (DEMADEX) 20 MG tablet Take by mouth.    . vitamin B-12 (CYANOCOBALAMIN) 1000 MCG tablet Take 1,000 mcg by mouth daily.     No current facility-administered medications for this visit.    VITAL SIGNS: There were no vitals taken for this visit. There were no vitals filed for this visit.  Estimated body mass index is 21.79 kg/m as calculated from the following:   Height as of 06/06/20: '5\' 6"'  (1.676 m).   Weight as of an earlier encounter on 06/12/20: 135 lb (61.2 kg).  LABS: CBC:    Component Value Date/Time   WBC 11.6 (H) 06/06/2020 0943   HGB 13.9 06/06/2020 0943   HGB 14.9 09/12/2014 0405   HCT 40.0 06/06/2020 0943   HCT 44.0 09/12/2014 0405   PLT 243 06/06/2020 0943   PLT 263 09/12/2014 0405   MCV 95.0 06/06/2020 0943   MCV 93 09/12/2014 0405   NEUTROABS 7.0 06/04/2020 1204   NEUTROABS 6.7 (H) 09/12/2014 0405   LYMPHSABS 1.2 06/04/2020 1204   LYMPHSABS 0.9  (L) 09/12/2014 0405   MONOABS 0.8 06/04/2020 1204   MONOABS 0.3 09/12/2014 0405   EOSABS 0.1 06/04/2020 1204   EOSABS 0.0 09/12/2014 0405   BASOSABS 0.0 06/04/2020 1204   BASOSABS 0.0 09/12/2014 0405   Comprehensive Metabolic Panel:    Component Value Date/Time   NA 133 (L) 06/04/2020 1204   NA 139 09/14/2014 0425   K 3.7 06/04/2020 1204   K 3.6 09/14/2014 0425   CL 84 (L) 06/04/2020 1204   CL 103 09/14/2014 0425   CO2 34 (H) 06/04/2020 1204   CO2 31 09/14/2014 0425   BUN 25 (H) 06/04/2020 1204   BUN 17 09/14/2014 0425   CREATININE 0.92 06/04/2020 1204   CREATININE 0.97 09/14/2014 0425   GLUCOSE 91 06/04/2020 1204   GLUCOSE 79 09/14/2014 0425   CALCIUM 9.0 06/04/2020 1204   CALCIUM 8.1 (L) 09/14/2014 0425   AST 26 06/04/2020 1204   AST 26 02/28/2012 1756   ALT 29 06/04/2020 1204   ALT 36 02/28/2012 1756   ALKPHOS 115 06/04/2020 1204   ALKPHOS 60 02/28/2012 1756   BILITOT 0.8 06/04/2020 1204   BILITOT 1.4 (H) 02/28/2012 1756   PROT 7.9 06/04/2020 1204   PROT 7.4 02/28/2012 1756   ALBUMIN 4.3 06/04/2020 1204   ALBUMIN 3.6 02/28/2012 1756    RADIOGRAPHIC STUDIES: NM PET Image Restag (PS) Skull Base To Thigh  Result Date: 05/23/2020 CLINICAL DATA:  Subsequent treatment strategy for non-small cell lung cancer of the right upper lobe. Radiation therapy 2 years ago. Recent appearance suggesting progression of the right upper lobe nodule. Also a mixed attenuation right hepatic lobe lesion was of concern on prior CT. EXAM: NUCLEAR MEDICINE PET SKULL BASE TO THIGH TECHNIQUE: 7.1 mCi F-18 FDG was injected intravenously. Full-ring PET  imaging was performed from the skull base to thigh after the radiotracer. CT data was obtained and used for attenuation correction and anatomic localization. Fasting blood glucose: 73 mg/dl COMPARISON:  Multiple exams, including PET-CT of 11/12/2016 and CT chest of 05/11/2020 FINDINGS: Mediastinal blood pool activity: SUV max 2.2 Liver activity: SUV max  N/A NECK: No significant abnormal hypermetabolic activity in this region. Incidental CT findings: Chronic bilateral maxillary sinusitis. Bilateral carotid atherosclerotic calcifications. CHEST: Superior segment right lower lobe nodule along the major fissure measures 1.4 cm in long axis on image 109/3 has a maximum SUV of 6.5, compatible with malignancy. The oval-shaped region nodularity further anteriorly in the right upper lobe on image 98/3 measures 1.3 cm short axis, and has lower grade metabolic activity with maximum SUV of 2.4, previously 4.2. Incidental CT findings: Severe emphysema. Coronary, aortic arch, and branch vessel atherosclerotic vascular disease. ABDOMEN/PELVIS: In the dome of the right hepatic lobe, a 2.9 cm hypodense lesion has maximum SUV of 15.6, compatible with active malignancy. Porta hepatis lymph node 1.3 cm in short axis on image 157/3, maximum SUV 10.9, compatible with malignancy. Incidental CT findings: Infrarenal abdominal aortic aneurysm 3.9 cm in anterior-posterior dimension on image 179/3, previously 3.5 cm on 11/12/2016. Photopenic hypodense right renal lesion, likely a cyst. Descending and sigmoid colon diverticulosis. SKELETON: Irregular lucency and sclerosis in the right anterior third rib near the bandlike and oval-shaped density in the right upper lobe that has the low metabolic activity. This rib lesion likewise has mildly accentuated metabolic activity and potentially mild subpectoral thickening adjacent to the rib lesion, maximum SUV 2.3. Given the low-grade activity, this could represent chronic findings related to radiation necrosis. Incidental CT findings: Healing benign-appearing fracture of the left posterior ninth rib. Postoperative findings in the lumbar spine. Chronic wedging of T12 and chronic inferior endplate compression fracture at T5. IMPRESSION: 1. Unfortunately, the oval-shaped nodule in the superior segment right lower lobe abutting the major fissure is  hypermetabolic compatible with malignancy. Also the lesion in the dome of the right hepatic lobe is highly hypermetabolic, and there is a hypermetabolic and enlarged porta hepatis lymph nodes compatible with active malignancy. 2. Low-grade metabolic activity in the nodule in the right upper lobe likely representing prior treated lesion. Given the current low-grade activity, some component of residual malignancy is not excluded. 3. Accentuated activity along a mixed lucent and sclerotic lesion in the right anterior third rib with some adjacent soft tissue prominence the subpectoral region. This low-grade activity could be from chronic inflammatory findings related to radiation necrosis, with low-grade malignancy as a differential diagnostic consideration. Surveillance is likely warranted. 4. Infrarenal abdominal aortic aneurysm, 3.9 cm in diameter today and previously 3.5 cm in diameter on 11/12/2016. Recommend followup by [place] in 2 years. This recommendation follows ACR consensus guidelines: White Paper of the ACR Incidental Findings Committee II on Vascular Findings. J Am Coll Radiol 2013; 10:789-794. 5. Other imaging findings of potential clinical significance: Aortic Atherosclerosis (ICD10-I70.0) and Emphysema (ICD10-J43.9). Chronic bilateral maxillary sinusitis. Descending and sigmoid colon diverticulosis. Healing left posterior ninth rib fracture. Electronically Signed   By: Van Clines M.D.   On: 05/23/2020 16:21   US BIOPSY (LIVER)  Result Date: 06/06/2020 INDICATION: History of lung cancer, now with indeterminate hypermetabolic liver lesion worrisome for metastatic disease. Please perform ultrasound-guided liver lesion biopsy for tissue diagnostic purposes. EXAM: ULTRASOUND GUIDED LIVER LESION BIOPSY COMPARISON:  PET-CT-05/23/2020; chest CT-05/11/2020 MEDICATIONS: None ANESTHESIA/SEDATION: Fentanyl 50 mcg IV; Versed 1 mg IV Total Moderate  Sedation time:  14 Minutes. The patient's level of  consciousness and vital signs were monitored continuously by radiology nursing throughout the procedure under my direct supervision. COMPLICATIONS: None immediate. PROCEDURE: Informed written consent was obtained from the patient after a discussion of the risks, benefits and alternatives to treatment. The patient understands and consents the procedure. A timeout was performed prior to the initiation of the procedure. Ultrasound scanning was performed of the right upper abdominal quadrant demonstrates an approximately 3.6 x 3.2 x 3.1 cm mixed echogenic nodule/mass within the dome of the right lobe of the liver (images 2 through 6) correlating with the hypermetabolic lesion seen on preceding PET-CT. The procedure was planned. The right upper abdominal quadrant was prepped and draped in the usual sterile fashion. The overlying soft tissues were anesthetized with 1% lidocaine with epinephrine. A 17 gauge, 6.8 cm co-axial needle was advanced into a peripheral aspect of the lesion. This was followed by 5 core biopsies with an 18 gauge core device under direct ultrasound guidance. The coaxial needle tract was embolized with a small amount of Gel-Foam slurry and superficial hemostasis was obtained with manual compression. Post procedural scanning was negative for definitive area of hemorrhage or additional complication. A dressing was placed. The patient tolerated the procedure well without immediate post procedural complication. IMPRESSION: Technically successful ultrasound guided core needle biopsy of indeterminate nodule/mass within the dome of the right lobe of the liver. Electronically Signed   By: Sandi Mariscal M.D.   On: 06/06/2020 13:19    PERFORMANCE STATUS (ECOG) : 3 - Symptomatic, >50% confined to bed  Review of Systems Unless otherwise noted, a complete review of systems is negative.  Physical Exam General: NAD, thin, in wheelchair Pulmonary: Unlabored Extremities: no edema, no joint deformities Skin:  no rashes Neurological: Weakness but otherwise nonfocal  IMPRESSION: I met with patient and daughter following their visit with Dr. Rogue Bussing.  Treatment options are felt to be limited due to patient's poor performance status.  Immunotherapy versus targeted treatment were discussed as possible options.  At baseline, he uses a walker to ambulate and sometimes needs assistance with standing.  Daughter is patient's primary caregiver.  Patient was tearful as he described thinking that he would not pursue treatment.  He says that he is quality of life is extremely poor.  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    Emotional support provided.  Patient says that he will take a couple weeks to consider options.  We will plan to follow-up with patient when he next sees Dr. Rogue Bussing.  Patient says that he is a DNR.  He has a DNR order at home.  I did send him home with a MOST form in [facility] documents.    PLAN: -DNR/DNI -RTC in 2 to 3 weeks  Case and plan discussed with Dr. Rogue Bussing   Patient expressed understanding and was in agreement with this plan. He also understands that He can call the clinic at any time with any questions, concerns, or complaints.     Time Total: 20 minutes  Visit consisted of counseling and education dealing with the complex and emotionally intense issues of symptom management and palliative care in the setting of serious and potentially life-threatening illness.Greater than 50%  of this time was spent counseling and coordinating care related to the above assessment and plan.  Signed by: Altha Harm, PhD, NP-C

## 2020-06-12 NOTE — Progress Notes (Signed)
  Oncology Nurse Navigator Documentation  Navigator Location: [facility] (06/12/20 1100)   )Navigator Encounter Type: Follow-up Appt (06/12/20 1100)     Confirmed Diagnosis Date: 06/08/20 (06/12/20 1100)               Patient Visit Type: MedOnc (06/12/20 1100) Treatment Phase: Pre-Tx/Tx Discussion (06/12/20 1100) Barriers/Navigation Needs: Coordination of Care (06/12/20 1100)   Interventions: Coordination of Care;Referrals (06/12/20 1100) Referrals: Palliative Care (06/12/20 1100) Coordination of Care: Appts (06/12/20 1100)       met with patient and his daughter during follow up visit with Dr. Rogue Bussing to review biopsy results and discuss treatment options. All questions answered during visit. Pt and daughter given resources regarding diagnosis and supportive services available. Reviewed upcoming appts. Instructed to call with any further questions or needs. Pt and daughter verbalized understanding. Nothing further needed at this time.           Time Spent with Patient: 60 (06/12/20 1100)

## 2020-06-12 NOTE — Progress Notes (Signed)
[place] [facility] NOTE  Patient Care Team: Adin Hector, MD as PCP - General (Internal Medicine) Telford Nab, RN as Oncology Nurse Navigator  CHIEF COMPLAINTS/PURPOSE OF CONSULTATION: lung nodule/mass  #  Oncology History Overview Note  # 2018- March- Right Upper lobe [Stage I] s/p Bx- Adenocarcinoma- acinar pattern s/p SBRT  # June 2021- PET- Progression/recurrence-  [Unfortunately, the oval-shaped nodule in the superior segment right lower lobe abutting the major fissure is hypermetabolic compatible with malignancy. Also the lesion in the dome of the right hepatic lobe is highly hypermetabolic, and there is a hypermetabolic and enlarged porta hepatis lymph nodes compatible with active Malignancy].   # CAD s/p stent; Hx of tremors [Dr.Shah]  # NGS/MOLECULAR TESTS:    # PALLIATIVE CARE EVALUATION:  # PAIN MANAGEMENT:    DIAGNOSIS:   STAGE:         ;  GOALS:  CURRENT/MOST RECENT THERAPY :     Cancer of upper lobe of right lung ([place])  03/02/2017 Initial Diagnosis   Cancer of upper lobe of right lung (HCC)      HISTORY OF PRESENTING ILLNESS:  Ryan Jarvis 69 y.o.  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  Patient continues to feel poorly.  Complains of continued shortness of breath continued fatigue.  His activity is also very limited at home.  He goes on with a walker.  Chronic joint pains.  Chroni back pain.   Review of Systems  Constitutional: Positive for malaise/fatigue. Negative for chills, diaphoresis, fever and weight loss.  HENT: Negative for nosebleeds and sore throat.   Eyes: Negative for double vision.  Respiratory: Positive for cough, sputum production and shortness of breath. Negative for hemoptysis and wheezing.   Cardiovascular: Negative for chest pain, palpitations, orthopnea and leg swelling.  Gastrointestinal:  Positive for diarrhea and heartburn. Negative for abdominal pain, blood in stool, constipation, melena, nausea and vomiting.  Genitourinary: Negative for dysuria, frequency and urgency.  Musculoskeletal: Positive for back pain and joint pain.  Skin: Negative.  Negative for itching and rash.  Neurological: Positive for headaches. Negative for dizziness, tingling, focal weakness and weakness.  Endo/Heme/Allergies: Bruises/bleeds easily.  Psychiatric/Behavioral: Negative for depression. The patient is not nervous/anxious and does not have insomnia.      MEDICAL HISTORY:  Past Medical History:  Diagnosis Date   Anemia    Anxiety    Arthritis    Cancer ([place])    COPD (chronic obstructive pulmonary disease) ([place])    Coronary artery disease    04/1996 inferior MI. Cardiac cath; anterolateral and inferior hypokinesis. The left main was normal and mid LAD had a 50% lesion, D2 was 50-75%, mid circumflex was 95% adn there was a totally occluded RCA.  There was distal filling of the RCA from br   Dyspnea    Emphysema of lung (HCC)    GERD (gastroesophageal reflux disease)    Hiatal hernia    Hypertension    Myocardial infarction ([place])    PUD (peptic ulcer disease)    Stroke [facility])    Thoracic aortic aneurysm without rupture (HCC)    Tremors of nervous system    Vitamin B12 deficiency     SURGICAL HISTORY: Past Surgical History:  Procedure Laterality Date   adenomatous polyps     BACK SURGERY     COLONOSCOPY WITH PROPOFOL N/A 06/28/2018   Procedure:  COLONOSCOPY WITH PROPOFOL;  Surgeon: Manya Silvas, MD;  Location: [facility] ENDOSCOPY;  Service: Endoscopy;  Laterality: N/A;   CORONARY ANGIOPLASTY     ESOPHAGOGASTRODUODENOSCOPY (EGD) WITH PROPOFOL N/A 06/28/2018   Procedure: ESOPHAGOGASTRODUODENOSCOPY (EGD) WITH PROPOFOL;  Surgeon: Manya Silvas, MD;  Location: [facility] ENDOSCOPY;  Service: Endoscopy;  Laterality: N/A;   STENT REMOVAL      SOCIAL HISTORY: Social  History   Socioeconomic History   Marital status: Married    Spouse name: Not on file   Number of children: Not on file   Years of education: Not on file   Highest education level: Not on file  Occupational History   Not on file  Tobacco Use   Smoking status: Former Smoker    Packs/day: 1.00    Years: 49.00    Pack years: 49.00    Types: Cigarettes   Smokeless tobacco: Never Used   Tobacco comment: quit smoking in 2013  Substance and Sexual Activity   Alcohol use: Yes    Comment: takes sips of liquor during day/night  for cough   Drug use: No   Sexual activity: Not on file  Other Topics Concern   Not on file  Social History Narrative   [place] from [place]; hx of smoking; 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    Social Determinants of Health   Financial Resource Strain:    Difficulty of Paying Living Expenses:   Food Insecurity:    Worried About Charity fundraiser in the Last Year:    Arboriculturist in the Last Year:   Transportation Needs:    Film/video editor (Medical):    Lack of Transportation (Non-Medical):   Physical Activity:    Days of Exercise per Week:    Minutes of Exercise per Session:   Stress:    Feeling of Stress :   Social Connections:    Frequency of Communication with Friends and Family:    Frequency of Social Gatherings with Friends and Family:    Attends Religious Services:    Active Member of Clubs or Organizations:    Attends Music therapist:    Marital Status:   Intimate Partner Violence:    Fear of Current or Ex-Partner:    Emotionally Abused:    Physically Abused:    Sexually Abused:     FAMILY HISTORY: Family History  Problem Relation Age of Onset   Congestive Heart Failure Mother    Congestive Heart Failure Father     ALLERGIES:  is allergic to zolpidem, belladonna alkaloids, and sulfa antibiotics.  MEDICATIONS:  Current  Outpatient Medications  Medication Sig Dispense Refill   acetaminophen (TYLENOL) 325 MG tablet Take 2 tablets (650 mg total) by mouth every 4 (four) hours as needed for mild pain (temp > 101.5).     albuterol (VENTOLIN HFA) 108 (90 Base) MCG/ACT inhaler INHALE 2 PUFFS BY MOUTH EVERY 6 HOURS AS NEEDED FOR WHEEZING     ALPRAZolam (XANAX) 0.5 MG tablet TAKE ONE TABLET BY MOUTH THREE TIMES DAILY AS NEEDED FOR ANXIETY     aspirin 81 MG tablet Take 81 mg daily by mouth.     atorvastatin (LIPITOR) 40 MG tablet TAKE ONE TABLET BY MOUTH ONCE DAILY     azelastine (ASTELIN) 0.1 % nasal spray Place 1 spray into both nostrils 2 (two) times daily. Use in each nostril as directed     benzonatate (TESSALON) 100  MG capsule Take 200 mg by mouth 3 (three) times daily as needed for cough.     Cholecalciferol (VITAMIN D-1000 MAX ST) 1000 units tablet Take 1 tablet by mouth 1 day or 1 dose.     diphenhydramine-acetaminophen (TYLENOL PM) 25-500 MG TABS tablet Take 1 tablet by mouth at bedtime as needed.     doxepin (SINEQUAN) 10 MG capsule Take 10 mg by mouth.     DULoxetine (CYMBALTA) 30 MG capsule Take 3 capsules by mouth daily.      fluticasone furoate-vilanterol (BREO ELLIPTA) 100-25 MCG/INH AEPB Inhale 1 puff into the lungs daily.     furosemide (LASIX) 80 MG tablet TAKE ONE TABLET BY MOUTH ONCE DAILY     HYDROcodone-acetaminophen (NORCO) 7.5-325 MG tablet Take 1 tablet by mouth every 6 (six) hours as needed.     magnesium oxide (MAG-OX) 400 MG tablet Take 400 mg 2 (two) times daily by mouth.     metolazone (ZAROXOLYN) 5 MG tablet Take 1 tablet by mouth as needed.      metoprolol succinate (TOPROL-XL) 100 MG 24 hr tablet TAKE ONE TABLET BY MOUTH ONCE DAILY     montelukast (SINGULAIR) 10 MG tablet Take 1 tablet by mouth at bedtime.     nitroGLYCERIN (NITROSTAT) 0.4 MG SL tablet Place 0.4 mg every 5 (five) minutes as needed under the tongue for chest pain.     OXYGEN Inhale 2 L into the lungs at  bedtime.     pantoprazole (PROTONIX) 40 MG tablet Take 40 mg by mouth 2 (two) times daily.      potassium chloride SA (K-DUR,KLOR-CON) 20 MEQ tablet Take 1 tablet by mouth 1 day or 1 dose.     primidone (MYSOLINE) 250 MG tablet TAKE 1.5 TABLET BY twice daily     promethazine (PHENERGAN) 25 MG tablet Take 25 mg every 6 (six) hours as needed by mouth for nausea or vomiting.     QUEtiapine (SEROQUEL) 25 MG tablet Take 25 mg by mouth at bedtime.     Roflumilast (DALIRESP) 250 MCG TABS Take 1 tablet by mouth daily.     saccharomyces boulardii (FLORASTOR) 250 MG capsule Take 1 capsule (250 mg total) by mouth 2 (two) times daily. 30 capsule 0   sucralfate (CARAFATE) 1 g tablet Take 1 g by mouth 2 (two) times daily.      SYMBICORT 160-4.5 MCG/ACT inhaler      tamsulosin (FLOMAX) 0.4 MG CAPS capsule TAKE ONE CAPSULE BY MOUTH ONCE DAILY 30 MINUTES AFTER THE SAME MEAL EACH DAY     theophylline (UNIPHYL) 400 MG 24 hr tablet Take 1 tablet by mouth daily.      tiotropium (SPIRIVA HANDIHALER) 18 MCG inhalation capsule INHALE ONE DOSE BY MOUTH ONCE DAILY     tiZANidine (ZANAFLEX) 4 MG tablet Take 1 tablet by mouth every 8 (eight) hours as needed.     torsemide (DEMADEX) 20 MG tablet Take by mouth.     vitamin B-12 (CYANOCOBALAMIN) 1000 MCG tablet Take 1,000 mcg by mouth daily.     No current facility-administered medications for this visit.      Marland Kitchen  PHYSICAL EXAMINATION: ECOG PERFORMANCE STATUS: 2 - Symptomatic, <50% confined to bed  Vitals:   06/12/20 0844  BP: 118/75  Pulse: 83  Temp: (!) 96.2 F (35.7 C)  SpO2: 97%   Filed Weights   06/12/20 0844  Weight: 135 lb (61.2 kg)    Physical Exam Constitutional:      Comments: Thin  cachectic appearing Caucasian male patient.  Patient in wheelchair.  Accompanied by his daughter.  HENT:     Head: Normocephalic and atraumatic.     Mouth/Throat:     Pharynx: No oropharyngeal exudate.  Eyes:     Pupils: Pupils are equal, round, and  reactive to light.  Cardiovascular:     Rate and Rhythm: Normal rate and regular rhythm.  Pulmonary:     Effort: No respiratory distress.     Breath sounds: No wheezing.     Comments: Decreased breath sounds bilaterally.  No wheeze. Abdominal:     General: Bowel sounds are normal. There is no distension.     Palpations: Abdomen is soft. There is no mass.     Tenderness: There is no abdominal tenderness. There is no guarding or rebound.  Musculoskeletal:        General: No tenderness. Normal range of motion.     Cervical back: Normal range of motion and neck supple.  Skin:    General: Skin is warm.     Comments: Multiple chronic bruises noted on the extremities.  Neurological:     Mental Status: He is alert and oriented to person, place, and time.  Psychiatric:        Mood and Affect: Affect normal.      LABORATORY DATA:  I have reviewed the data as listed Lab Results  Component Value Date   WBC 11.6 (H) 06/06/2020   HGB 13.9 06/06/2020   HCT 40.0 06/06/2020   MCV 95.0 06/06/2020   PLT 243 06/06/2020   Recent Labs    05/11/20 0932 06/04/20 1204  NA  --  133*  K  --  3.7  CL  --  84*  CO2  --  34*  GLUCOSE  --  91  BUN  --  25*  CREATININE 0.70 0.92  CALCIUM  --  9.0  GFRNONAA  --  >60  GFRAA  --  >60  PROT  --  7.9  ALBUMIN  --  4.3  AST  --  26  ALT  --  29  ALKPHOS  --  115  BILITOT  --  0.8    RADIOGRAPHIC STUDIES: I have personally reviewed the radiological images as listed and agreed with the findings in the report. NM PET Image Restag (PS) Skull Base To Thigh  Result Date: 05/23/2020 CLINICAL DATA:  Subsequent treatment strategy for non-small cell lung cancer of the right upper lobe. Radiation therapy 2 years ago. Recent appearance suggesting progression of the right upper lobe nodule. Also a mixed attenuation right hepatic lobe lesion was of concern on prior CT. EXAM: NUCLEAR MEDICINE PET SKULL BASE TO THIGH TECHNIQUE: 7.1 mCi F-18 FDG was injected  intravenously. Full-ring PET imaging was performed from the skull base to thigh after the radiotracer. CT data was obtained and used for attenuation correction and anatomic localization. Fasting blood glucose: 73 mg/dl COMPARISON:  Multiple exams, including PET-CT of 11/12/2016 and CT chest of 05/11/2020 FINDINGS: Mediastinal blood pool activity: SUV max 2.2 Liver activity: SUV max N/A NECK: No significant abnormal hypermetabolic activity in this region. Incidental CT findings: Chronic bilateral maxillary sinusitis. Bilateral carotid atherosclerotic calcifications. CHEST: Superior segment right lower lobe nodule along the major fissure measures 1.4 cm in long axis on image 109/3 has a maximum SUV of 6.5, compatible with malignancy. The oval-shaped region nodularity further anteriorly in the right upper lobe on image 98/3 measures 1.3 cm short axis, and has lower grade metabolic  activity with maximum SUV of 2.4, previously 4.2. Incidental CT findings: Severe emphysema. Coronary, aortic arch, and branch vessel atherosclerotic vascular disease. ABDOMEN/PELVIS: In the dome of the right hepatic lobe, a 2.9 cm hypodense lesion has maximum SUV of 15.6, compatible with active malignancy. Porta hepatis lymph node 1.3 cm in short axis on image 157/3, maximum SUV 10.9, compatible with malignancy. Incidental CT findings: Infrarenal abdominal aortic aneurysm 3.9 cm in anterior-posterior dimension on image 179/3, previously 3.5 cm on 11/12/2016. Photopenic hypodense right renal lesion, likely a cyst. Descending and sigmoid colon diverticulosis. SKELETON: Irregular lucency and sclerosis in the right anterior third rib near the bandlike and oval-shaped density in the right upper lobe that has the low metabolic activity. This rib lesion likewise has mildly accentuated metabolic activity and potentially mild subpectoral thickening adjacent to the rib lesion, maximum SUV 2.3. Given the low-grade activity, this could represent chronic  findings related to radiation necrosis. Incidental CT findings: Healing benign-appearing fracture of the left posterior ninth rib. Postoperative findings in the lumbar spine. Chronic wedging of T12 and chronic inferior endplate compression fracture at T5. IMPRESSION: 1. Unfortunately, the oval-shaped nodule in the superior segment right lower lobe abutting the major fissure is hypermetabolic compatible with malignancy. Also the lesion in the dome of the right hepatic lobe is highly hypermetabolic, and there is a hypermetabolic and enlarged porta hepatis lymph nodes compatible with active malignancy. 2. Low-grade metabolic activity in the nodule in the right upper lobe likely representing prior treated lesion. Given the current low-grade activity, some component of residual malignancy is not excluded. 3. Accentuated activity along a mixed lucent and sclerotic lesion in the right anterior third rib with some adjacent soft tissue prominence the subpectoral region. This low-grade activity could be from chronic inflammatory findings related to radiation necrosis, with low-grade malignancy as a differential diagnostic consideration. Surveillance is likely warranted. 4. Infrarenal abdominal aortic aneurysm, 3.9 cm in diameter today and previously 3.5 cm in diameter on 11/12/2016. Recommend followup by [place] in 2 years. This recommendation follows ACR consensus guidelines: White Paper of the ACR Incidental Findings Committee II on Vascular Findings. J Am Coll Radiol 2013; 10:789-794. 5. Other imaging findings of potential clinical significance: Aortic Atherosclerosis (ICD10-I70.0) and Emphysema (ICD10-J43.9). Chronic bilateral maxillary sinusitis. Descending and sigmoid colon diverticulosis. Healing left posterior ninth rib fracture. Electronically Signed   By: Van Clines M.D.   On: 05/23/2020 16:21   US BIOPSY (LIVER)  Result Date: 06/06/2020 INDICATION: History of lung cancer, now with indeterminate  hypermetabolic liver lesion worrisome for metastatic disease. Please perform ultrasound-guided liver lesion biopsy for tissue diagnostic purposes. EXAM: ULTRASOUND GUIDED LIVER LESION BIOPSY COMPARISON:  PET-CT-05/23/2020; chest CT-05/11/2020 MEDICATIONS: None ANESTHESIA/SEDATION: Fentanyl 50 mcg IV; Versed 1 mg IV Total Moderate Sedation time:  14 Minutes. The patient's level of consciousness and vital signs were monitored continuously by radiology nursing throughout the procedure under my direct supervision. COMPLICATIONS: None immediate. PROCEDURE: Informed written consent was obtained from the patient after a discussion of the risks, benefits and alternatives to treatment. The patient understands and consents the procedure. A timeout was performed prior to the initiation of the procedure. Ultrasound scanning was performed of the right upper abdominal quadrant demonstrates an approximately 3.6 x 3.2 x 3.1 cm mixed echogenic nodule/mass within the dome of the right lobe of the liver (images 2 through 6) correlating with the hypermetabolic lesion seen on preceding PET-CT. The procedure was planned. The right upper abdominal quadrant was prepped and draped in the usual  sterile fashion. The overlying soft tissues were anesthetized with 1% lidocaine with epinephrine. A 17 gauge, 6.8 cm co-axial needle was advanced into a peripheral aspect of the lesion. This was followed by 5 core biopsies with an 18 gauge core device under direct ultrasound guidance. The coaxial needle tract was embolized with a small amount of Gel-Foam slurry and superficial hemostasis was obtained with manual compression. Post procedural scanning was negative for definitive area of hemorrhage or additional complication. A dressing was placed. The patient tolerated the procedure well without immediate post procedural complication. IMPRESSION: Technically successful ultrasound guided core needle biopsy of indeterminate nodule/mass within the dome of  the right lobe of the liver. Electronically Signed   By: Sandi Mariscal M.D.   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   #A long discussion with patient and daughter regarding stage and pathology.  Discussed treatments are palliative not curative.  #Discussed that the treatment option include combination includes chemoimmunotherapy vs immunotherapy vs targeted therapy [less likely].  However final decision based on NGS.  #Given patient's borderline performance status/severe COPD-other comorbidities patient in general high risk for side effects from any of the treatment options.  If patient chooses treatment I would prefer single agent immunotherapy.  See discussion below  # Severe COPD/ chronic back pain-borderline performance status.  See discussion below  # palliative care evaluation: awaiting palliative care evaluation today.  Reluctantly agrees for evaluation.  # Prognosis: Discussed unfortunately prognosis is poor; discussed that the median survival is anywhere between 12 to 18 months.  Patient is at risk of side effects given his borderline performance status/overall reluctance with treatment options.  Discussed regarding brain MRI-reluctant.  # DISPOSITION: # follow up in 2 weeks; MD;labs- [email or web address]      All questions were answered. The patient knows to call the clinic with any problems, questions or concerns.    Cammie Sickle, MD 06/12/2020 9:57 AM

## 2020-06-12 NOTE — Assessment & Plan Note (Addendum)
#   RECURRENT ADENOCARCINOMA of LUNG-metastasis to liver; porta hepatis lymphadenopathy STAGE IV- NGS pending.   #A long discussion with patient and daughter regarding stage and pathology.  Discussed treatments are palliative not curative.  #Discussed that the treatment option include combination includes chemoimmunotherapy vs immunotherapy vs targeted therapy [less likely].  However final decision based on NGS.  #Given patient's borderline performance status/severe COPD-other comorbidities patient in general high risk for side effects from any of the treatment options.  If patient chooses treatment I would prefer single agent immunotherapy.  See discussion below  # Severe COPD/ chronic back pain-borderline performance status.  See discussion below  # palliative care evaluation: awaiting palliative care evaluation today.  Reluctantly agrees for evaluation.  # Prognosis: Discussed unfortunately prognosis is poor; discussed that the median survival is anywhere between 12 to 18 months.  Patient is at risk of side effects given his borderline performance status/overall reluctance with treatment options.  Discussed regarding brain MRI-reluctant.  # DISPOSITION: # follow up in 2 weeks; MD;labs- [email or web address]

## 2020-06-13 ENCOUNTER — Ambulatory Visit: Payer: Medicare Other | Admitting: Radiation Oncology

## 2020-06-14 ENCOUNTER — Other Ambulatory Visit: Payer: Medicare Other

## 2020-06-14 NOTE — Progress Notes (Signed)
Tumor Board Documentation  Ryan Jarvis was presented by Dr Rogue Bussing at our Tumor Board on 06/14/2020, which included representatives from medical oncology, radiation oncology, internal medicine, navigation, pathology, radiology, surgical, genetics, research, palliative care.  Ryan Jarvis currently presents as a new patient, for new positive pathology with history of the following treatments: active survellience, surgical intervention(s), neoadjuvant radiation.  Additionally, we reviewed previous medical and familial history, history of present illness, and recent lab results along with all available histopathologic and imaging studies. The tumor board considered available treatment options and made the following recommendations: Immunotherapy    The following procedures/referrals were also placed: No orders of the defined types were placed in this encounter.   Clinical Trial Status: not discussed   Staging used: AJCC Stage Group  AJCC Staging: T: 1 N: 0 M: 1 Group: Stage 4 Adenocarcinoma of lung with liver Mets   National site-specific guidelines NCCN were discussed with respect to the case.  Tumor board is a meeting of clinicians from various specialty areas who evaluate and discuss patients for whom a multidisciplinary approach is being considered. Final determinations in the plan of care are those of the provider(s). The responsibility for follow up of recommendations given during tumor board is that of the provider.   Today's extended care, comprehensive team conference, Ryan Jarvis was not present for the discussion and was not examined.   Multidisciplinary Tumor Board is a multidisciplinary case peer review process.  Decisions discussed in the Multidisciplinary Tumor Board reflect the opinions of the specialists present at the conference without having examined the patient.  Ultimately, treatment and diagnostic decisions rest with the primary provider(s) and the patient.

## 2020-06-20 ENCOUNTER — Encounter: Payer: Self-pay | Admitting: Internal Medicine

## 2020-06-21 ENCOUNTER — Encounter: Payer: Self-pay | Admitting: Internal Medicine

## 2020-06-26 ENCOUNTER — Other Ambulatory Visit: Payer: Self-pay | Admitting: Emergency Medicine

## 2020-06-26 ENCOUNTER — Other Ambulatory Visit: Payer: Self-pay | Admitting: *Deleted

## 2020-06-26 ENCOUNTER — Encounter: Payer: Self-pay | Admitting: Internal Medicine

## 2020-06-26 DIAGNOSIS — E785 Hyperlipidemia, unspecified: Secondary | ICD-10-CM

## 2020-06-26 DIAGNOSIS — C3490 Malignant neoplasm of unspecified part of unspecified bronchus or lung: Secondary | ICD-10-CM

## 2020-06-26 DIAGNOSIS — Z79899 Other long term (current) drug therapy: Secondary | ICD-10-CM

## 2020-06-26 DIAGNOSIS — I10 Essential (primary) hypertension: Secondary | ICD-10-CM

## 2020-06-26 NOTE — Progress Notes (Signed)
Pt reports "feeling sick as a dog." Endorses nausea, some vomiting and dry heaving. Not eating much, "but you don't need to eat much when you're just sitting around." Doesn't have any specific questions or concerns.

## 2020-06-27 ENCOUNTER — Inpatient Hospital Stay (HOSPITAL_BASED_OUTPATIENT_CLINIC_OR_DEPARTMENT_OTHER): Payer: Medicare Other | Admitting: Hospice and Palliative Medicine

## 2020-06-27 ENCOUNTER — Other Ambulatory Visit: Payer: Self-pay

## 2020-06-27 ENCOUNTER — Inpatient Hospital Stay: Payer: Medicare Other

## 2020-06-27 ENCOUNTER — Encounter: Payer: Self-pay | Admitting: *Deleted

## 2020-06-27 ENCOUNTER — Ambulatory Visit
Admission: RE | Admit: 2020-06-27 | Discharge: 2020-06-27 | Disposition: A | Discharge status: Home / Self Care | Payer: Medicare Other | Source: Ambulatory Visit | Attending: Radiation Oncology | Admitting: Radiation Oncology

## 2020-06-27 ENCOUNTER — Inpatient Hospital Stay: Payer: Medicare Other | Attending: Internal Medicine | Admitting: Internal Medicine

## 2020-06-27 DIAGNOSIS — Z79899 Other long term (current) drug therapy: Secondary | ICD-10-CM | POA: Insufficient documentation

## 2020-06-27 DIAGNOSIS — C787 Secondary malignant neoplasm of liver and intrahepatic bile duct: Secondary | ICD-10-CM | POA: Insufficient documentation

## 2020-06-27 DIAGNOSIS — M2550 Pain in unspecified joint: Secondary | ICD-10-CM | POA: Insufficient documentation

## 2020-06-27 DIAGNOSIS — Z87891 Personal history of nicotine dependence: Secondary | ICD-10-CM | POA: Insufficient documentation

## 2020-06-27 DIAGNOSIS — C3411 Malignant neoplasm of upper lobe, right bronchus or lung: Secondary | ICD-10-CM

## 2020-06-27 DIAGNOSIS — Z923 Personal history of irradiation: Secondary | ICD-10-CM | POA: Diagnosis not present

## 2020-06-27 DIAGNOSIS — M545 Low back pain: Secondary | ICD-10-CM | POA: Insufficient documentation

## 2020-06-27 DIAGNOSIS — G8929 Other chronic pain: Secondary | ICD-10-CM | POA: Insufficient documentation

## 2020-06-27 DIAGNOSIS — I10 Essential (primary) hypertension: Secondary | ICD-10-CM

## 2020-06-27 DIAGNOSIS — Z515 Encounter for palliative care: Secondary | ICD-10-CM

## 2020-06-27 DIAGNOSIS — J449 Chronic obstructive pulmonary disease, unspecified: Secondary | ICD-10-CM | POA: Insufficient documentation

## 2020-06-27 DIAGNOSIS — E785 Hyperlipidemia, unspecified: Secondary | ICD-10-CM

## 2020-06-27 LAB — CBC WITH DIFFERENTIAL/PLATELET
Abs Immature Granulocytes: 0.06 10*3/uL (ref 0.00–0.07)
Basophils Absolute: 0 10*3/uL (ref 0.0–0.1)
Basophils Relative: 0 %
Eosinophils Absolute: 0.1 10*3/uL (ref 0.0–0.5)
Eosinophils Relative: 1 %
HCT: 41.2 % (ref 39.0–52.0)
Hemoglobin: 14.1 g/dL (ref 13.0–17.0)
Immature Granulocytes: 1 %
Lymphocytes Relative: 11 %
Lymphs Abs: 1.3 10*3/uL (ref 0.7–4.0)
MCH: 32.7 pg (ref 26.0–34.0)
MCHC: 34.2 g/dL (ref 30.0–36.0)
MCV: 95.6 fL (ref 80.0–100.0)
Monocytes Absolute: 1.1 10*3/uL — ABNORMAL HIGH (ref 0.1–1.0)
Monocytes Relative: 9 %
Neutro Abs: 9.6 10*3/uL — ABNORMAL HIGH (ref 1.7–7.7)
Neutrophils Relative %: 78 %
Platelets: 330 10*3/uL (ref 150–400)
RBC: 4.31 MIL/uL (ref 4.22–5.81)
RDW: 13.1 % (ref 11.5–15.5)
WBC: 12.1 10*3/uL — ABNORMAL HIGH (ref 4.0–10.5)
nRBC: 0 % (ref 0.0–0.2)

## 2020-06-27 LAB — COMPREHENSIVE METABOLIC PANEL
ALT: 80 U/L — ABNORMAL HIGH (ref 0–44)
AST: 58 U/L — ABNORMAL HIGH (ref 15–41)
Albumin: 4.1 g/dL (ref 3.5–5.0)
Alkaline Phosphatase: 269 U/L — ABNORMAL HIGH (ref 38–126)
Anion gap: 13 (ref 5–15)
BUN: 15 mg/dL (ref 8–23)
CO2: 37 mmol/L — ABNORMAL HIGH (ref 22–32)
Calcium: 9.4 mg/dL (ref 8.9–10.3)
Chloride: 83 mmol/L — ABNORMAL LOW (ref 98–111)
Creatinine, Ser: 0.91 mg/dL (ref 0.61–1.24)
GFR calc Af Amer: >60 mL/min (ref >60)
GFR calc non Af Amer: >60 mL/min (ref >60)
Glucose, Bld: 103 mg/dL — ABNORMAL HIGH (ref 70–99)
Potassium: 4 mmol/L (ref 3.5–5.1)
Sodium: 133 mmol/L — ABNORMAL LOW (ref 135–145)
Total Bilirubin: 0.9 mg/dL (ref 0.3–1.2)
Total Protein: 8 g/dL (ref 6.5–8.1)

## 2020-06-27 LAB — TSH: TSH: 1.187 u[IU]/mL (ref 0.350–4.500)

## 2020-06-27 NOTE — Progress Notes (Signed)
Patient did not want to stay for visit today.  I attempted to call him but he was eating lunch.  I spoke with his daughter instead.  She thinks that patient has decided not to pursue any future treatment. any future treatment. We discussed the option of hospice. Note that patient's wife is also being treated for cancer and is considering discontinuing treatments.

## 2020-06-27 NOTE — Progress Notes (Signed)
[place] NOTE  Patient Care Team: Adin Hector, MD as PCP - General (Internal Medicine) Telford Nab, RN as Oncology Nurse Navigator Cammie Sickle, MD as Consulting Physician (Internal Medicine)  CHIEF COMPLAINTS/PURPOSE OF CONSULTATION: lung nodule/mass  #  Oncology History Overview Note  # 2018- March- Right Upper lobe [Stage I] s/p Bx- Adenocarcinoma- acinar pattern s/p SBRT  # June 2021- PET- Progression/recurrence-  [Unfortunately, the oval-shaped nodule in the superior segment right lower lobe abutting the major fissure is hypermetabolic compatible with malignancy. Also the lesion in the dome of the right hepatic lobe is highly hypermetabolic, and there is a hypermetabolic and enlarged porta hepatis lymph nodes compatible with active Malignancy].   # CAD s/p stent; Hx of tremors [Dr.Shah]  # NGS/MOLECULAR TESTS:    # PALLIATIVE CARE EVALUATION:  # PAIN MANAGEMENT:    DIAGNOSIS:   STAGE:         ;  GOALS:  CURRENT/MOST RECENT THERAPY :     Cancer of upper lobe of right lung ([place])  03/02/2017 Initial Diagnosis   Cancer of upper lobe of right lung ([facility])      HISTORY OF PRESENTING ILLNESS:  Ryan Jarvis 69 y.o.  male history of smoking; severe COPD and recurrent stage IV lung cancer is here for follow-up; to review the results of the NGS.  He continues to feel poorly.  Continues to have significant back pain walks with a walker.  Chronic joint pains.  Limited mobility.  Risk of falls.  Continues to have shortness of breath poor appetite weight loss.   Review of Systems  Constitutional: Positive for malaise/fatigue. Negative for chills, diaphoresis, fever and weight loss.  HENT: Negative for nosebleeds and sore throat.   Eyes: Negative for double vision.  Respiratory: Positive for cough, sputum production and shortness of breath. Negative for hemoptysis and wheezing.   Cardiovascular: Negative for chest pain,  palpitations, orthopnea and leg swelling.  Gastrointestinal: Positive for diarrhea and heartburn. Negative for abdominal pain, blood in stool, constipation, melena, nausea and vomiting.  Genitourinary: Negative for dysuria, frequency and urgency.  Musculoskeletal: Positive for back pain and joint pain.  Skin: Negative.  Negative for itching and rash.  Neurological: Positive for headaches. Negative for dizziness, tingling, focal weakness and weakness.  Endo/Heme/Allergies: Bruises/bleeds easily.  Psychiatric/Behavioral: Negative for depression. The patient is not nervous/anxious and does not have insomnia.      MEDICAL HISTORY:  Past Medical History:  Diagnosis Date  . Anemia   . Anxiety   . Arthritis   . Cancer ([place])   . COPD (chronic obstructive pulmonary disease) ([place])   . Coronary artery disease    04/1996 inferior MI. Cardiac cath; anterolateral and inferior hypokinesis. The left main was normal and mid LAD had a 50% lesion, D2 was 50-75%, mid circumflex was 95% adn there was a totally occluded RCA.  There was distal filling of the RCA from br  . Dyspnea   . Emphysema of lung ([place])   . GERD (gastroesophageal reflux disease)   . Hiatal hernia   . Hypertension   . Myocardial infarction ([place])   . PUD (peptic ulcer disease)   . Stroke ([place])   . Thoracic aortic aneurysm without rupture ([place])   . Tremors of nervous system   . Vitamin B12 deficiency     SURGICAL HISTORY: Past Surgical History:  Procedure Laterality Date  . adenomatous polyps    . BACK SURGERY    .  COLONOSCOPY WITH PROPOFOL N/A 06/28/2018   Procedure: COLONOSCOPY WITH PROPOFOL;  Surgeon: Manya Silvas, MD;  Location: [facility] ENDOSCOPY;  Service: Endoscopy;  Laterality: N/A;  . CORONARY ANGIOPLASTY    . ESOPHAGOGASTRODUODENOSCOPY (EGD) WITH PROPOFOL N/A 06/28/2018   Procedure: ESOPHAGOGASTRODUODENOSCOPY (EGD) WITH PROPOFOL;  Surgeon: Manya Silvas, MD;  Location[facility] ENDOSCOPY;  Service: Endoscopy;   Laterality: N/A;  . STENT REMOVAL      SOCIAL HISTORY: Social History   Socioeconomic History  . Marital status: Married    Spouse name: Not on file  . Number of children: Not on file  . Years of education: Not on file  . Highest education level: Not on file  Occupational History  . Not on file  Tobacco Use  . Smoking status: Former Smoker    Packs/day: 1.00    Years: 49.00    Pack years: 49.00    Types: Cigarettes  . Smokeless tobacco: Never Used  . Tobacco comment: quit smoking in 2013  Substance and Sexual Activity  . Alcohol use: Yes    Comment: takes sips of liquor during day/night  for cough  . Drug use: No  . Sexual activity: Not on file  Other Topics Concern  . 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    Social Determinants of Health   Financial Resource Strain:   . Difficulty of Paying Living Expenses:   Food Insecurity:   . Worried About Charity fundraiser in the Last Year:   . Arboriculturist in the Last Year:   Transportation Needs:   . Film/video editor (Medical):   Marland Kitchen Lack of Transportation (Non-Medical):   Physical Activity:   . Days of Exercise per Week:   . Minutes of Exercise per Session:   Stress:   . Feeling of Stress :   Social Connections:   . Frequency of Communication with Friends and Family:   . Frequency of Social Gatherings with Friends and Family:   . Attends Religious Services:   . Active Member of Clubs or Organizations:   . Attends Archivist Meetings:   Marland Kitchen Marital Status:   Intimate Partner Violence:   . Fear of Current or Ex-Partner:   . Emotionally Abused:   Marland Kitchen Physically Abused:   . Sexually Abused:     FAMILY HISTORY: Family History  Problem Relation Age of Onset  . Congestive Heart Failure Mother   . Congestive Heart Failure Father     ALLERGIES:  is allergic to zolpidem,  belladonna alkaloids, and sulfa antibiotics.  MEDICATIONS:  Current Outpatient Medications  Medication Sig Dispense Refill  . acetaminophen (TYLENOL) 325 MG tablet Take 2 tablets (650 mg total) by mouth every 4 (four) hours as needed for mild pain (temp > 101.5).    Marland Kitchen albuterol (VENTOLIN HFA) 108 (90 Base) MCG/ACT inhaler INHALE 2 PUFFS BY MOUTH EVERY 6 HOURS AS NEEDED FOR WHEEZING    . ALPRAZolam (XANAX) 0.5 MG tablet TAKE ONE TABLET BY MOUTH THREE TIMES DAILY AS NEEDED FOR ANXIETY    . aspirin 81 MG tablet Take 81 mg daily by mouth.    Marland Kitchen atorvastatin (LIPITOR) 40 MG tablet TAKE ONE TABLET BY MOUTH ONCE DAILY    . azelastine (ASTELIN) 0.1 % nasal spray Place 1 spray into both nostrils 2 (two) times daily. Use in each nostril as  directed    . benzonatate (TESSALON) 100 MG capsule Take 200 mg by mouth 3 (three) times daily as needed for cough.    . Cholecalciferol (VITAMIN D-1000 MAX ST) 1000 units tablet Take 1 tablet by mouth 1 day or 1 dose.    . diphenhydramine-acetaminophen (TYLENOL PM) 25-500 MG TABS tablet Take 1 tablet by mouth at bedtime as needed.    . doxepin (SINEQUAN) 10 MG capsule Take 10 mg by mouth.    . DULoxetine (CYMBALTA) 30 MG capsule Take 3 capsules by mouth daily.     . fluticasone furoate-vilanterol (BREO ELLIPTA) 100-25 MCG/INH AEPB Inhale 1 puff into the lungs daily.    . furosemide (LASIX) 80 MG tablet TAKE ONE TABLET BY MOUTH ONCE DAILY    . HYDROcodone-acetaminophen (NORCO) 7.5-325 MG tablet Take 1 tablet by mouth every 6 (six) hours as needed.    . magnesium oxide (MAG-OX) 400 MG tablet Take 400 mg 2 (two) times daily by mouth.    . metolazone (ZAROXOLYN) 5 MG tablet Take 1 tablet by mouth as needed.     . metoprolol succinate (TOPROL-XL) 100 MG 24 hr tablet TAKE ONE TABLET BY MOUTH ONCE DAILY    . montelukast (SINGULAIR) 10 MG tablet Take 1 tablet by mouth at bedtime.    . nitroGLYCERIN (NITROSTAT) 0.4 MG SL tablet Place 0.4 mg every 5 (five) minutes as needed  under the tongue for chest pain.    . OXYGEN Inhale 2 L into the lungs at bedtime.    . pantoprazole (PROTONIX) 40 MG tablet Take 40 mg by mouth 2 (two) times daily.     . potassium chloride SA (K-DUR,KLOR-CON) 20 MEQ tablet Take 1 tablet by mouth 1 day or 1 dose.    . primidone (MYSOLINE) 250 MG tablet TAKE 1.5 TABLET BY twice daily    . promethazine (PHENERGAN) 25 MG tablet Take 25 mg every 6 (six) hours as needed by mouth for nausea or vomiting.    . propranolol (INDERAL) 20 MG tablet Take 20 mg by mouth 2 (two) times daily.    . QUEtiapine (SEROQUEL) 25 MG tablet Take 25 mg by mouth at bedtime.    . Roflumilast (DALIRESP) 250 MCG TABS Take 1 tablet by mouth daily.    Marland Kitchen saccharomyces boulardii (FLORASTOR) 250 MG capsule Take 1 capsule (250 mg total) by mouth 2 (two) times daily. 30 capsule 0  . sucralfate (CARAFATE) 1 g tablet Take 1 g by mouth 2 (two) times daily.     . SYMBICORT 160-4.5 MCG/ACT inhaler     . tamsulosin (FLOMAX) 0.4 MG CAPS capsule TAKE ONE CAPSULE BY MOUTH ONCE DAILY 30 MINUTES AFTER THE SAME MEAL EACH DAY    . theophylline (UNIPHYL) 400 MG 24 hr tablet Take 1 tablet by mouth daily.     Marland Kitchen tiotropium (SPIRIVA HANDIHALER) 18 MCG inhalation capsule INHALE ONE DOSE BY MOUTH ONCE DAILY    . tiZANidine (ZANAFLEX) 4 MG tablet Take 1 tablet by mouth every 8 (eight) hours as needed.    . torsemide (DEMADEX) 20 MG tablet Take by mouth.    . vitamin B-12 (CYANOCOBALAMIN) 1000 MCG tablet Take 1,000 mcg by mouth daily.     No current facility-administered medications for this visit.      Marland Kitchen  PHYSICAL EXAMINATION: ECOG PERFORMANCE STATUS: 2 - Symptomatic, <50% confined to bed  Vitals:   06/27/20 0940  BP: 134/85  Pulse: 79  Resp: 20  Temp: 98.3 F (36.8 C)  Filed Weights   06/27/20 0940  Weight: 135 lb (61.2 kg)    Physical Exam Constitutional:      Comments: Thin cachectic appearing Caucasian male patient.  Patient in wheelchair.  Accompanied by his brother.    HENT:     Head: Normocephalic and atraumatic.     Mouth/Throat:     Pharynx: No oropharyngeal exudate.  Eyes:     Pupils: Pupils are equal, round, and reactive to light.  Cardiovascular:     Rate and Rhythm: Normal rate and regular rhythm.  Pulmonary:     Effort: No respiratory distress.     Breath sounds: No wheezing.     Comments: Decreased breath sounds bilaterally.  No wheeze. Abdominal:     General: Bowel sounds are normal. There is no distension.     Palpations: Abdomen is soft. There is no mass.     Tenderness: There is no abdominal tenderness. There is no guarding or rebound.  Musculoskeletal:        General: No tenderness. Normal range of motion.     Cervical back: Normal range of motion and neck supple.  Skin:    General: Skin is warm.     Comments: Multiple chronic bruises noted on the extremities.  Neurological:     Mental Status: He is alert and oriented to person, place, and time.  Psychiatric:        Mood and Affect: Affect normal.      LABORATORY DATA:  I have reviewed the data as listed Lab Results  Component Value Date   WBC 12.1 (H) 06/27/2020   HGB 14.1 06/27/2020   HCT 41.2 06/27/2020   MCV 95.6 06/27/2020   PLT 330 06/27/2020   Recent Labs    05/11/20 0932 06/04/20 1204 06/27/20 0849  NA  --  133* 133*  K  --  3.7 4.0  CL  --  84* 83*  CO2  --  34* 37*  GLUCOSE  --  91 103*  BUN  --  25* 15  CREATININE 0.70 0.92 0.91  CALCIUM  --  9.0 9.4  GFRNONAA  --  >60 >60  GFRAA  --  >60 >60  PROT  --  7.9 8.0  ALBUMIN  --  4.3 4.1  AST  --  26 58*  ALT  --  29 80*  ALKPHOS  --  115 269*  BILITOT  --  0.8 0.9    RADIOGRAPHIC STUDIES: I have personally reviewed the radiological images as listed and agreed with the findings in the report. US BIOPSY (LIVER)  Result Date: 06/06/2020 INDICATION: History of lung cancer, now with indeterminate hypermetabolic liver lesion worrisome for metastatic disease. Please perform ultrasound-guided liver  lesion biopsy for tissue diagnostic purposes. EXAM: ULTRASOUND GUIDED LIVER LESION BIOPSY COMPARISON:  PET-CT-05/23/2020; chest CT-05/11/2020 MEDICATIONS: None ANESTHESIA/SEDATION: Fentanyl 50 mcg IV; Versed 1 mg IV Total Moderate Sedation time:  14 Minutes. The patient's level of consciousness and vital signs were monitored continuously by radiology nursing throughout the procedure under my direct supervision. COMPLICATIONS: None immediate. PROCEDURE: Informed written consent was obtained from the patient after a discussion of the risks, benefits and alternatives to treatment. The patient understands and consents the procedure. A timeout was performed prior to the initiation of the procedure. Ultrasound scanning was performed of the right upper abdominal quadrant demonstrates an approximately 3.6 x 3.2 x 3.1 cm mixed echogenic nodule/mass within the dome of the right lobe of the liver (images 2 through 6) correlating  with the hypermetabolic lesion seen on preceding PET-CT. The procedure was planned. The right upper abdominal quadrant was prepped and draped in the usual sterile fashion. The overlying soft tissues were anesthetized with 1% lidocaine with epinephrine. A 17 gauge, 6.8 cm co-axial needle was advanced into a peripheral aspect of the lesion. This was followed by 5 core biopsies with an 18 gauge core device under direct ultrasound guidance. The coaxial needle tract was embolized with a small amount of Gel-Foam slurry and superficial hemostasis was obtained with manual compression. Post procedural scanning was negative for definitive area of hemorrhage or additional complication. A dressing was placed. The patient tolerated the procedure well without immediate post procedural complication. IMPRESSION: Technically successful ultrasound guided core needle biopsy of indeterminate nodule/mass within the dome of the right lobe of the liver. Electronically Signed   By: Sandi Mariscal M.D.   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   #A long discussion with patient and brother regarding stage and pathology.  Discussed treatments are palliative not curative.   #Recommend single agent immunotherapy Keytruda-to help control the disease.  Patient understands he is at risk of complications from immunotherapy.  Patient declines MRI brain.  # Severe COPD/ chronic back pain-borderline performance status-see below  # Prognosis: Patient's prognosis is unfortunately poor; especially his reluctance with therapy/borderline performance status/multiple comorbidities; concerns of side effects.  Patient seems to be angry given his medical diagnosis/and also given his wife's poor health.  At this time is not interested in any treatment.  He declined to meet with palliative care.   # DISPOSITION: # Follow up TBD- Dr.B  Addendum; I had a long discussion with patient's daughter, Tammy regarding patient's treatment plan; also patient's preference.  Family also agrees that her father has had poor health for the last many months especially his back pain/unable to farm.  Discussed regarding palliative care evaluation at home/hospice referral.  She will call us if interested for a palliative care referral.  Discussed with [facility].       All questions were answered. The patient knows to call the clinic with any problems, questions or concerns.    Cammie Sickle, MD 06/27/2020 9:58 PM

## 2020-06-27 NOTE — Assessment & Plan Note (Addendum)
#  RECURRENT ADENOCARCINOMA of LUNG-metastasis to liver; porta hepatis lymphadenopathy STAGE IV- NGS- PDL-1- 100%.   #A long discussion with patient and brother regarding stage and pathology.  Discussed treatments are palliative not curative.   #Recommend single agent immunotherapy Keytruda-to help control the disease.  Patient understands he is at risk of complications from immunotherapy.  Patient declines MRI brain.  # Severe COPD/ chronic back pain-borderline performance status-see below  # Prognosis: Patient's prognosis is unfortunately poor; especially his reluctance with therapy/borderline performance status/multiple comorbidities; concerns of side effects.  Patient seems to be angry given his medical diagnosis/and also given his wife's poor health.  At this time is not interested in any treatment.  He declined to meet with palliative care.   # DISPOSITION: # Follow up TBD- Dr.B  Addendum; I had a long discussion with patient's daughter, Tammy regarding patient's treatment plan; also patient's preference.  Family also agrees that her father has had poor health for the last many months especially his back pain/unable to farm.  Discussed regarding palliative care evaluation at home/hospice referral.  She will call us if interested for a palliative care referral.  Discussed with [facility].

## 2020-06-27 NOTE — Progress Notes (Signed)
Radiation Oncology Follow up Note  Name: Ryan Jarvis   Date:   06/27/2020 MRN:  [id number] DOB: 1951/03/08    This 69 y.o. male presents to the clinic today for review of his liver biopsy findings and patient now out 3-1/2 years status post SBRT to his right upper lobe for stage I adenocarcinoma.  REFERRING PROVIDER: Adin Hector, MD  HPI: Patient is a 69 year old male now out close to 4 years status post SBRT to his right upper lobe for stage I adenocarcinoma.  ReCT a recent CT scan showed progression of nodular opacity in superior segment the right lower lobe.  Also there was a 2 cm mixed attenuating lesion in the right anterior liver.  PET CT scan showed hypermetabolic oval-shaped nodule superior segment right lower lobe abutting the major fissure compatible with malignancy.  There is also hypermetabolic mass in the right hepatic lobe highly suggestive of malignancy.  Patient underwent.  Liver biopsy which was positive for metastatic poorly differentiated adenocarcinoma consistent with lung primary.  Patient been seen by Dr. B in medical oncology and recommendation for immunotherapy was made.  He is reluctant to undergo any further treatment or work-up.  COMPLICATIONS OF TREATMENT: none  FOLLOW UP COMPLIANCE: keeps appointments   PHYSICAL EXAM:  There were no vitals taken for this visit. Wheelchair-bound male in NAD.  Well-developed well-nourished patient in NAD. HEENT reveals PERLA, EOMI, discs not visualized.  Oral cavity is clear. No oral mucosal lesions are identified. Neck is clear without evidence of cervical or supraclavicular adenopathy. Lungs are clear to A&P. Cardiac examination is essentially unremarkable with regular rate and rhythm without murmur rub or thrill. Abdomen is benign with no organomegaly or masses noted. Motor sensory and DTR levels are equal and symmetric in the upper and lower extremities. Cranial nerves II through XII are grossly intact. Proprioception is  intact. No peripheral adenopathy or edema is identified. No motor or sensory levels are noted. Crude visual fields are within normal range.  RADIOLOGY RESULTS: PET CT and CT scans reviewed  PLAN: Present time I encouraged the patient to go ahead and give a trial of immunotherapy.  This may be beneficial.  He can always discontinue should side effects develop.  I do not see any role for palliative radiation therapy at this time.  I have asked to see the patient back in 6 months.  I believe he will opt in the next week or 2 to contact Dr. B and initiate immunotherapy.  I would like to take this opportunity to thank you for allowing me to participate in the care of your patient.Noreene Filbert, MD

## 2020-06-27 NOTE — Progress Notes (Signed)
  Oncology Nurse Navigator Documentation  Navigator Location: [facility]-Med Onc (06/27/20 1000)   )Navigator Encounter Type: Follow-up Appt (06/27/20 1000)                     Patient Visit Type: MedOnc (06/27/20 1000)   Barriers/Navigation Needs: No Needs;No Questions (06/27/20 1000)   Interventions: None Required (06/27/20 1000)           met with patient during follow up visit with Dr. Rogue Bussing to further discuss treatment options. Pt did not have any questions or needs at this time. Pt has declined treatment. Instructed to call back if changes his mind or has any further needs. Pt verbalized understanding. Nothing further needed at this time. Will follow up with pt's daughter later today as well.           Time Spent with Patient: 30 (06/27/20 1000)

## 2020-06-28 LAB — PRIMIDONE, SERUM
Phenobarbital: 17 ug/mL (ref 15–40)
Primidone, Serum: 18.1 ug/mL — ABNORMAL HIGH (ref 5.0–12.0)

## 2020-07-04 LAB — MISC LABCORP TEST (SEND OUT): Labcorp test code: 738526

## 2020-07-11 ENCOUNTER — Inpatient Hospital Stay (HOSPITAL_BASED_OUTPATIENT_CLINIC_OR_DEPARTMENT_OTHER): Payer: Medicare Other | Admitting: Hospice and Palliative Medicine

## 2020-07-11 DIAGNOSIS — Z515 Encounter for palliative care: Secondary | ICD-10-CM

## 2020-07-11 DIAGNOSIS — C3411 Malignant neoplasm of upper lobe, right bronchus or lung: Secondary | ICD-10-CM

## 2020-07-11 NOTE — Progress Notes (Signed)
Did not receive response to request for video visit.  I called and spoke with patient's daughter.  She said the patient was currently sleeping.  She says patient has decided to forego future treatment.  Discussed option of hospice involvement but daughter states that patient was upset that hospice was called into the home for his wife.  We will plan periodic palliative care follow-up.

## 2020-08-07 ENCOUNTER — Telehealth: Payer: Self-pay | Admitting: Nurse Practitioner

## 2020-08-07 NOTE — Telephone Encounter (Signed)
Called patient to schedule Palliative Consult and spoke with patient's brother and he requested that I speak with patient's daughter, Ryan Jarvis regarding this. Daughter was unavailable at the time of call and he took my name and number and will have her call me back.

## 2020-08-07 NOTE — Telephone Encounter (Signed)
Rec'd return call from daughter, Lynelle Smoke, and she was in agreement with scheduling the Palliative Consult.  I have scheduled an In-person Consult for 08/10/20 @ 10:30 AM

## 2020-08-08 ENCOUNTER — Inpatient Hospital Stay: Payer: Medicare Other | Attending: Hospice and Palliative Medicine | Admitting: Hospice and Palliative Medicine

## 2020-08-08 DIAGNOSIS — Z515 Encounter for palliative care: Secondary | ICD-10-CM

## 2020-08-08 NOTE — Progress Notes (Signed)
Patient did not respond to prompts for virtual visit.  I called and spoke with his daughter.  Patient's wife died last week and patient is currently sleeping.  Sounds like patient has progressive weakness.  Hospice has been discussed as an option for him but he is refused in the past.  Patient is pending a palliative care visit in the home later this week and hopefully can focus on grief management and discussion of goals.

## 2020-08-10 ENCOUNTER — Encounter: Payer: Self-pay | Admitting: Nurse Practitioner

## 2020-08-10 ENCOUNTER — Other Ambulatory Visit: Payer: Self-pay

## 2020-08-10 ENCOUNTER — Other Ambulatory Visit: Payer: Medicare Other | Admitting: Nurse Practitioner

## 2020-08-10 DIAGNOSIS — C3490 Malignant neoplasm of unspecified part of unspecified bronchus or lung: Secondary | ICD-10-CM

## 2020-08-10 DIAGNOSIS — Z515 Encounter for palliative care: Secondary | ICD-10-CM

## 2020-08-10 NOTE — Progress Notes (Signed)
[facility] Palliative Care Consult Note Telephone: [phone]  Fax: [phone]  PATIENT NAME: JAYSUN WESSELS DOB: [birth year] MRN: [id number]  PRIMARY CARE PROVIDER:   Adin Hector, MD  REFERRING PROVIDER:  Adin Hector, MD [facility] [place],  [place] [address]  RESPONSIBLE PARTY:   Daughter Knute Neu [phone]; [phone]  I was asked by Dr Caryl Comes to see Mr. Fatima for Palliative care consult for goals of care  RECOMMENDATIONS and PLAN:  1. ACP: DNR; hospice referral 2. Palliative care encounter; Palliative medicine team will continue to support patient, patient's family, and medical team. Visit consisted of counseling and education dealing with the complex and emotionally intense issues of symptom management and palliative care in the setting of serious and potentially life-threatening illness  I spent 90 minutes providing this consultation, start at 10:00am. More than 50% of the time in this consultation was spent coordinating communication.   HISTORY OF PRESENT ILLNESS:  KENNAN DETTER is a 69 y.o. year old male with multiple medical problems including 135lbs BMI 21. 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 Last seen by Dr Rogue [facility] 7 / 14 / 2021 discuss treatment options are palliative not curable but declines further treatment, declines further diagnostic workup with prognosis poor. Palliative care visit  initiated for goals of care. Palliative care visit with Mr. Mayeda, daughter Lynelle Smoke and niece Malachy Mood. We talked about purpose of palliative care. Mr Hiller agreement. We talked about how he was feeling today. Mr Pressnell was very tearful. Mr. Hausen endorses he is exhausted. Mr. Rapozo is having trouble sleeping at night he does take Seroquel 25 mg and was taking Zanaflex in addition to a very small amount of alcohol which helps him sleep. We talked about the risks of using alcohol with medications. Tammy endorses last night did not give him a zanaflex and he became upset. Mr. Haberle endorses he did not sleep well last night. Mr. Nong typically goes to bed about 7:30 but last night it was closer to 5:30. We talked about the option of melatonin. Tammy, daughter endorses she does have melatonin that he takes that fed time sometimes. We talked about regularly using the melatonin and maybe taking it at 6 if his typical bedtime is 7:30. We talked about his appetite being very poor. Mr. Aleen Sells has had little to eat over the last couple weeks. Typically eat less less than a few bites or quarter of a sandwich. We talked about appetite stimulant Remeron and may help with sleeping at bedtime. We also talked about the option of not doing the remeron and increasing the Seroquel which may help him get a better night's sleep. We talked about pill burden and medication list reviewed. We talked about stopping the vitamin to decrease polypharmacy until burden. Tammy endorses she would rather try Remeron for appetite stimulant and hopefully can help him sleep as he already is on an antidepressant. We talked about past medical history with cancer and treatments. Mr Nettleton endorse is he no longer wants any further treatments. We talked about symptoms of pain which he does experience.  Duragesic recently increased from 12.5 mg to 25. Tammy endorses she is waiting to pick that up from Pharmacy. Mr. Cammarata also uses hydrocodone typically 40  mg / 24 hours a day. Mr Copenhaver endorses that he does continue to have pain in his back. We talked about the option of Aspercreme with lidocaine topically and a heating pad. We talked about caution using a heating pad do to risk of burns. We talked about maybe using a towel between the skin and the heating pad to see if that would be helpful. We talked about chronic disease and progression. We talked about lung cancer with metastasis to his liver. We talked about a demon is legs and feet. We talked about compression hose as he has attempted to use them at one time but it is very difficult to get on and getting the right size. Tammy endorses Mr. Ozanich tries to keep legs up or more helpful. We talked about medical goals of care with wishes for comfort. We talked about the recent loss of his wife who passed away last week. We talked about her her cancer diagnosis after his. Mr. Heindl endorses this is been so hard with her passing. They have been married over 65 years and I have one daughter 25. We talked about Life review. 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 We talked about his wife. We talked about suffering. Mr. Varden endorses that he feels like he is suffering. We talked about coping strategies, grieving process. We talked about sadness and the challenges with his own terminal illness in the setting of a recent loss of his lifetime partner. We talked about role of palliative care. We talked about Hospice under the Medicare benefit. We talked about what Hospice Services would provide. Mr. Coger and Lynelle Smoke and agreement to proceed with hospice. Will contact Dr. Caryl Comes for order for [facility] and review with Hospice Physicians. Tammy in agreement. Will send in Remeron prescription. Therapeutic listening and emotional support provided. Contact information. Questions answered to  satisfaction. Palliative Care was asked to help address goals of care.   Rx: remeron 15mg  qhs,#14; no rf  CODE STATUS: DNR  PPS: 40% HOSPICE ELIGIBILITY/DIAGNOSIS: TBD  PAST MEDICAL HISTORY:  Past Medical History:  Diagnosis Date  . Anemia   . Anxiety   . Arthritis   . Cancer ([place])   . COPD (chronic obstructive pulmonary disease) ([place])   . Coronary artery disease    04/1996 inferior MI. Cardiac cath; anterolateral and inferior hypokinesis. The left main was normal and mid LAD had a 50% lesion, D2 was 50-75%, mid circumflex was 95% adn there was a totally occluded RCA.  There was distal filling of the RCA from br  . Dyspnea   . Emphysema of lung ([place])   . GERD (gastroesophageal reflux disease)   . Hiatal hernia   . Hypertension   . Myocardial infarction ([place])   . PUD (peptic ulcer disease)   . Stroke ([place])   . Thoracic aortic aneurysm without rupture ([place])   . Tremors of nervous system   . Vitamin B12 deficiency     SOCIAL HX:  Social History   Tobacco Use  . Smoking status: Former Smoker    Packs/day: 1.00    Years: 49.00    Pack years: 49.00    Types: Cigarettes  . Smokeless tobacco:  Never Used  . Tobacco comment: quit smoking in 2013  Substance Use Topics  . Alcohol use: Yes    Comment: takes sips of liquor during day/night  for cough    ALLERGIES:  Allergies  Allergen Reactions  . Zolpidem Other (See Comments)    agitation  . Belladonna Alkaloids   . Sulfa Antibiotics      PERTINENT MEDICATIONS:  Outpatient Encounter Medications as of 08/10/2020  Medication Sig  . acetaminophen (TYLENOL) 325 MG tablet Take 2 tablets (650 mg total) by mouth every 4 (four) hours as needed for mild pain (temp > 101.5).  Marland Kitchen albuterol (VENTOLIN HFA) 108 (90 Base) MCG/ACT inhaler INHALE 2 PUFFS BY MOUTH EVERY 6 HOURS AS NEEDED FOR WHEEZING  . ALPRAZolam (XANAX) 0.5 MG tablet TAKE ONE TABLET BY MOUTH THREE TIMES DAILY AS NEEDED FOR ANXIETY  . aspirin 81 MG tablet Take 81 mg  daily by mouth.  Marland Kitchen atorvastatin (LIPITOR) 40 MG tablet TAKE ONE TABLET BY MOUTH ONCE DAILY  . azelastine (ASTELIN) 0.1 % nasal spray Place 1 spray into both nostrils 2 (two) times daily. Use in each nostril as directed  . benzonatate (TESSALON) 100 MG capsule Take 200 mg by mouth 3 (three) times daily as needed for cough.  . Cholecalciferol (VITAMIN D-1000 MAX ST) 1000 units tablet Take 1 tablet by mouth 1 day or 1 dose.  . diphenhydramine-acetaminophen (TYLENOL PM) 25-500 MG TABS tablet Take 1 tablet by mouth at bedtime as needed.  . doxepin (SINEQUAN) 10 MG capsule Take 10 mg by mouth.  . DULoxetine (CYMBALTA) 30 MG capsule Take 3 capsules by mouth daily.   . fluticasone furoate-vilanterol (BREO ELLIPTA) 100-25 MCG/INH AEPB Inhale 1 puff into the lungs daily.  . furosemide (LASIX) 80 MG tablet TAKE ONE TABLET BY MOUTH ONCE DAILY  . HYDROcodone-acetaminophen (NORCO) 7.5-325 MG tablet Take 1 tablet by mouth every 6 (six) hours as needed.  . magnesium oxide (MAG-OX) 400 MG tablet Take 400 mg 2 (two) times daily by mouth.  . metolazone (ZAROXOLYN) 5 MG tablet Take 1 tablet by mouth as needed.   . metoprolol succinate (TOPROL-XL) 100 MG 24 hr tablet TAKE ONE TABLET BY MOUTH ONCE DAILY  . montelukast (SINGULAIR) 10 MG tablet Take 1 tablet by mouth at bedtime.  . nitroGLYCERIN (NITROSTAT) 0.4 MG SL tablet Place 0.4 mg every 5 (five) minutes as needed under the tongue for chest pain.  . OXYGEN Inhale 2 L into the lungs at bedtime.  . pantoprazole (PROTONIX) 40 MG tablet Take 40 mg by mouth 2 (two) times daily.   . potassium chloride SA (K-DUR,KLOR-CON) 20 MEQ tablet Take 1 tablet by mouth 1 day or 1 dose.  . primidone (MYSOLINE) 250 MG tablet TAKE 1.5 TABLET BY twice daily  . promethazine (PHENERGAN) 25 MG tablet Take 25 mg every 6 (six) hours as needed by mouth for nausea or vomiting.  . propranolol (INDERAL) 20 MG tablet Take 20 mg by mouth 2 (two) times daily.  . QUEtiapine (SEROQUEL) 25 MG tablet  Take 25 mg by mouth at bedtime.  . Roflumilast (DALIRESP) 250 MCG TABS Take 1 tablet by mouth daily.  Marland Kitchen saccharomyces boulardii (FLORASTOR) 250 MG capsule Take 1 capsule (250 mg total) by mouth 2 (two) times daily.  . sucralfate (CARAFATE) 1 g tablet Take 1 g by mouth 2 (two) times daily.   . SYMBICORT 160-4.5 MCG/ACT inhaler   . tamsulosin (FLOMAX) 0.4 MG CAPS capsule TAKE ONE CAPSULE BY MOUTH ONCE DAILY 30  MINUTES AFTER THE SAME MEAL EACH DAY  . theophylline (UNIPHYL) 400 MG 24 hr tablet Take 1 tablet by mouth daily.   Marland Kitchen tiotropium (SPIRIVA HANDIHALER) 18 MCG inhalation capsule INHALE ONE DOSE BY MOUTH ONCE DAILY  . tiZANidine (ZANAFLEX) 4 MG tablet Take 1 tablet by mouth every 8 (eight) hours as needed.  . torsemide (DEMADEX) 20 MG tablet Take by mouth.  . vitamin B-12 (CYANOCOBALAMIN) 1000 MCG tablet Take 1,000 mcg by mouth daily.   No facility-administered encounter medications on file as of 08/10/2020.    PHYSICAL EXAM:   General: O2 dependent, chronically ill,  frail appearing, thin, tearful male Cardiovascular: regular rate and rhythm Pulmonary: poor air movement Extremities: muscle wasting; atrophy Neurological: Weakness, short distances with his walker  Darrion Macaulay Ihor Gully, NP

## 2020-09-05 ENCOUNTER — Inpatient Hospital Stay: Payer: Medicare Other | Admitting: Hospice and Palliative Medicine

## 2020-09-05 ENCOUNTER — Other Ambulatory Visit: Payer: Self-pay

## 2020-09-14 DEATH — deceased

## 2020-12-31 ENCOUNTER — Ambulatory Visit: Payer: Medicare Other | Admitting: Radiation Oncology

## 2022-03-22 IMAGING — CT CT CHEST W/ CM
2 of 4 series · 13 of 36 positions shown, 16 images · IV contrast (omnipaque)
Comparison: 05/12/2019
COMPARISON: 05/12/2019

Addendum:
CLINICAL DATA: Non-small-cell lung cancer.

EXAM:
CT CHEST WITH CONTRAST
TECHNIQUE: Multidetector CT imaging of the chest was performed during
intravenous contrast administration.
CONTRAST:  75mL OMNIPAQUE IOHEXOL 300 MG/ML  SOLN

[Series 2: axial chest 2.00 · axial · 0.81mm/px · z∈[-1262,-964]mm · 10 of 177 slices shown, 13 images]
[im 14/177  mediastinal]
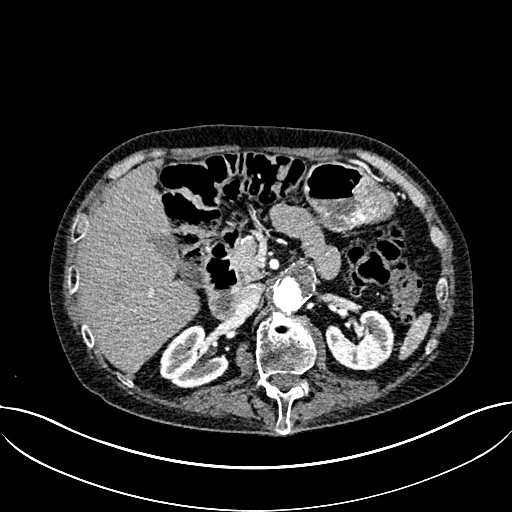
[im 14/177  lung]
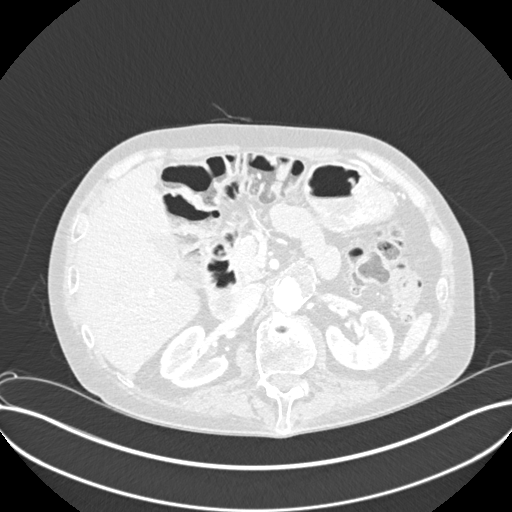
[im 28/177  lung]
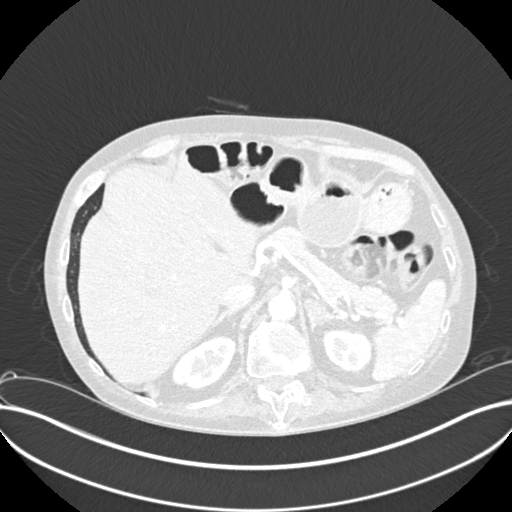
[im 55/177  lung]
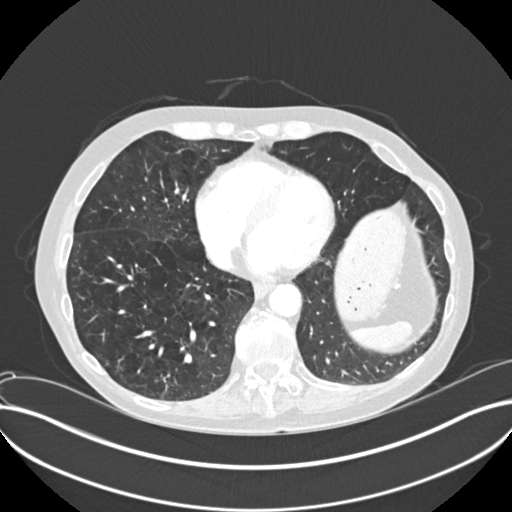
[im 68/177  lung]
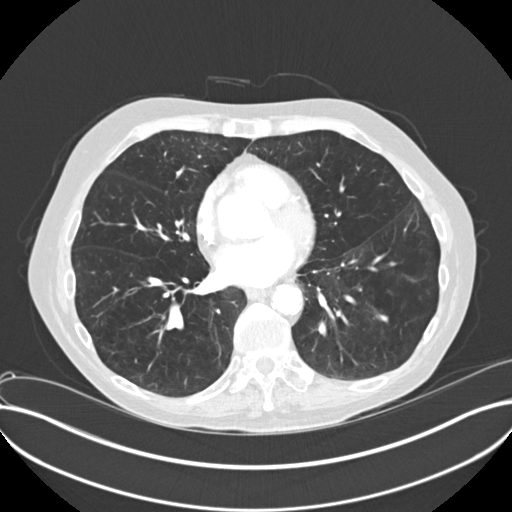
[im 82/177  mediastinal]
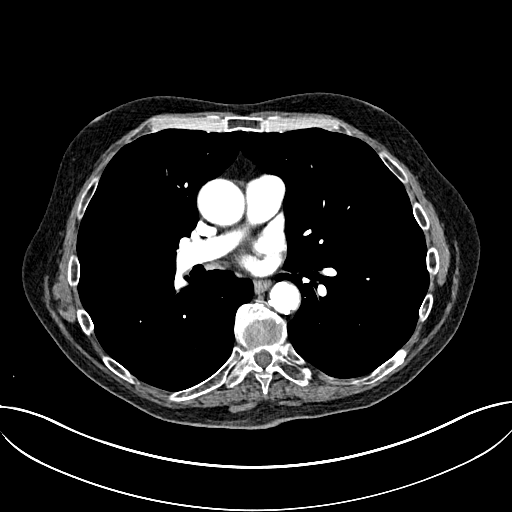
[im 82/177  lung]
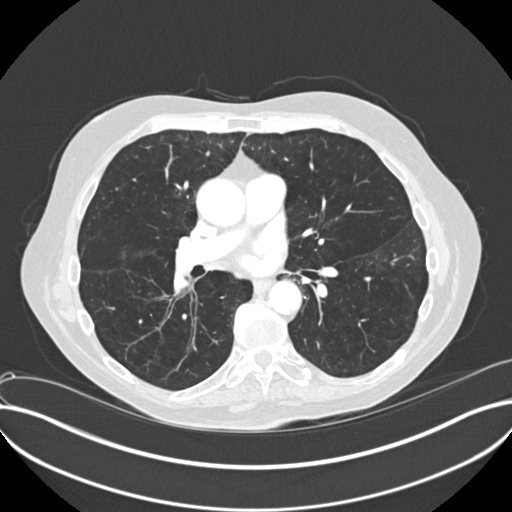
[im 95/177  lung]
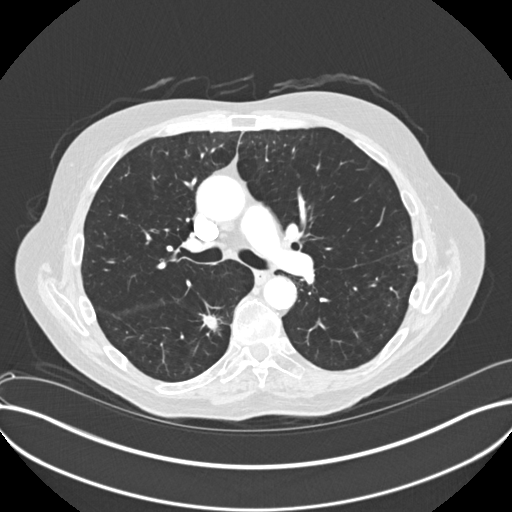
[im 109/177  lung]
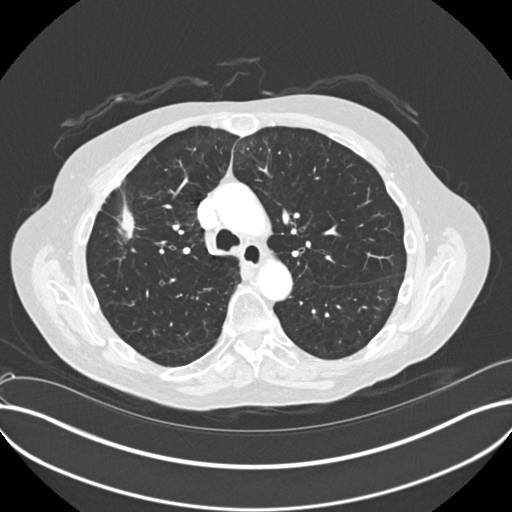
[im 136/177  lung]
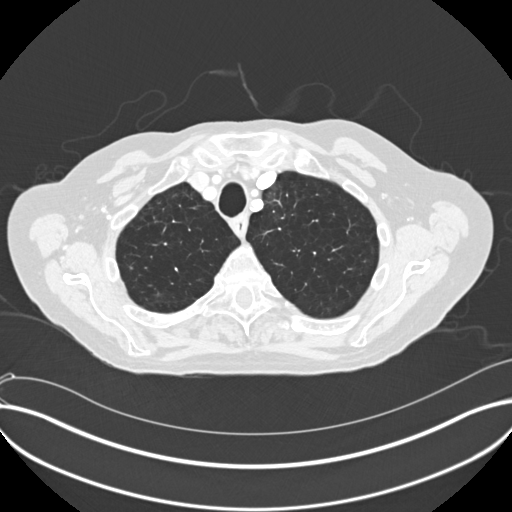
[im 149/177  mediastinal]
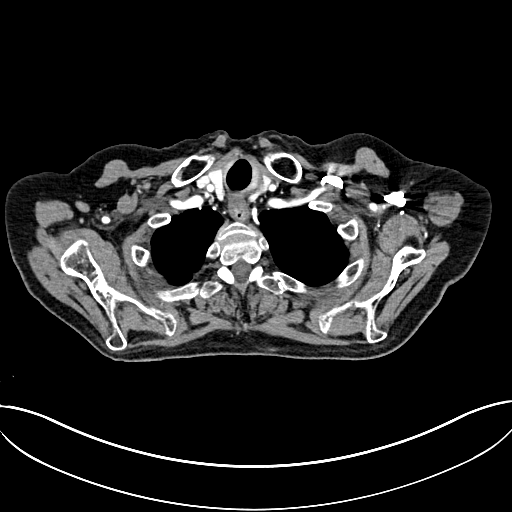
[im 149/177  lung]
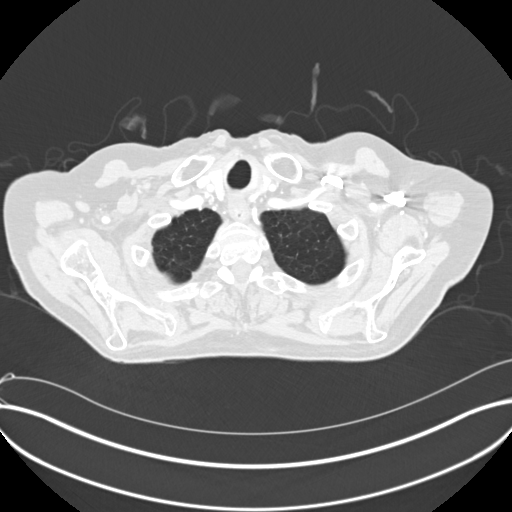
[im 163/177  lung]
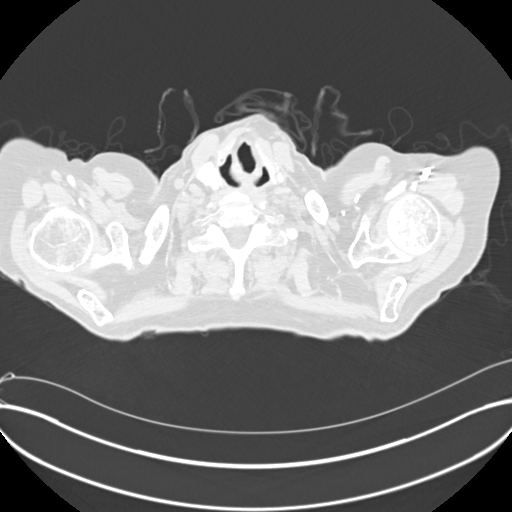

[Series 4: coronal chest 2.00 cor · coronal · 0.69mm/px · 3 of 176 slices shown]
[im 36/176  lung]
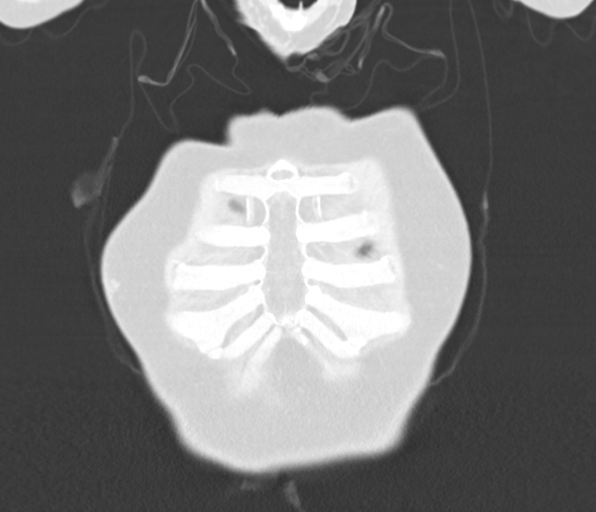
[im 71/176  lung]
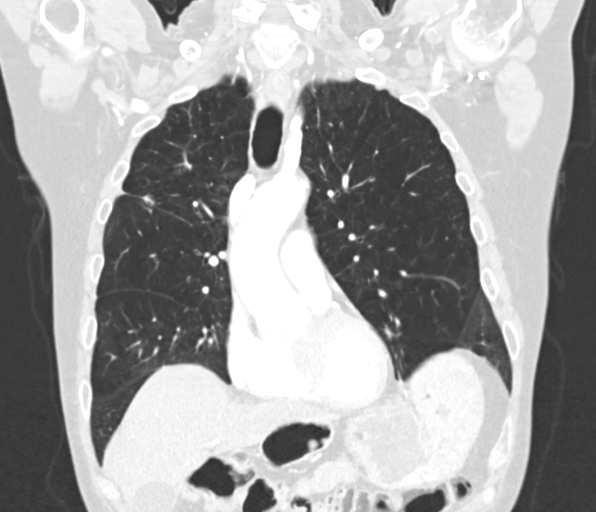
[im 106/176  lung]
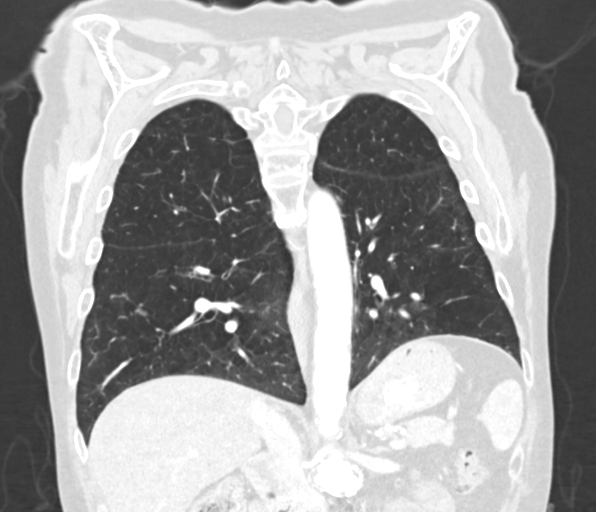

[13 of 36 positions shown; findings below may reference images not displayed]

FINDINGS: Cardiovascular: The heart size is normal. No substantial pericardial
effusion. Coronary artery calcification is evident. Atherosclerotic
calcification is noted in the wall of the thoracic aorta.

Mediastinum/Nodes: No mediastinal lymphadenopathy. There is no hilar
lymphadenopathy. The esophagus has normal imaging features. There is
no axillary lymphadenopathy.

Lungs/Pleura: Centrilobular and paraseptal emphysema evident. Oval
lesion right upper lobe measured previously at 23 x 15 mm is stable
at 23 x 13 mm today. This may reflect treatment scarring.

A second nodular opacity in the superior segment of the right lower
lobe (image 85/3) is clearly progressive in the interval measuring
1.6 x 1.1 cm today compared to 0.9 x 0.8 cm previously.

No suspicious nodule or mass in the left lung No focal airspace
consolidation. Insert no effusions

Upper Abdomen: [DATE] x 2.0 cm low-attenuation lesion in the anterior
right liver may have some peripheral enhancement. This was not
visible on the prior study or on an exam from 10/15/2018. [DATE] mm
low-density lesion in the right kidney is likely a cyst. Abdominal
aortic aneurysm incompletely visualize measuring up to 3.8 cm
diameter.

Musculoskeletal: No worrisome lytic or sclerotic osseous
abnormality. T12 compression fracture is stable as is an upper
thoracic compression deformity.
IMPRESSION: 1. Interval progression of the nodular opacity in the superior
segment of the right lower lobe. Given the interval growth, imaging
features are concerning for neoplasm. PET-CT recommended to further
evaluate.
2. Aortic Atherosclerosis (802F9-RO9.9) and Emphysema (802F9-I1R.U).

These results will be called to the ordering clinician or
representative by the Radiologist Assistant, and communication
documented in the PACS or [REDACTED].

ADDENDUM:
As noted in the body of the report, there is a new 2 cm mixed
attenuation lesion in the anterior right liver. Abdominal MRI
without with contrast would be the study of choice to further
evaluate. This could also be further assessed at the time of
follow-up PET-CT.

*** End of Addendum ***
FINDINGS: Cardiovascular: The heart size is normal. No substantial pericardial
effusion. Coronary artery calcification is evident. Atherosclerotic
calcification is noted in the wall of the thoracic aorta.

Mediastinum/Nodes: No mediastinal lymphadenopathy. There is no hilar
lymphadenopathy. The esophagus has normal imaging features. There is
no axillary lymphadenopathy.

Lungs/Pleura: Centrilobular and paraseptal emphysema evident. Oval
lesion right upper lobe measured previously at 23 x 15 mm is stable
at 23 x 13 mm today. This may reflect treatment scarring.

A second nodular opacity in the superior segment of the right lower
lobe (image 85/3) is clearly progressive in the interval measuring
1.6 x 1.1 cm today compared to 0.9 x 0.8 cm previously.

No suspicious nodule or mass in the left lung No focal airspace
consolidation. Insert no effusions

Upper Abdomen: [DATE] x 2.0 cm low-attenuation lesion in the anterior
right liver may have some peripheral enhancement. This was not
visible on the prior study or on an exam from 10/15/2018. [DATE] mm
low-density lesion in the right kidney is likely a cyst. Abdominal
aortic aneurysm incompletely visualize measuring up to 3.8 cm
diameter.

Musculoskeletal: No worrisome lytic or sclerotic osseous
abnormality. T12 compression fracture is stable as is an upper
thoracic compression deformity.
IMPRESSION: 1. Interval progression of the nodular opacity in the superior
segment of the right lower lobe. Given the interval growth, imaging
features are concerning for neoplasm. PET-CT recommended to further
evaluate.
2. Aortic Atherosclerosis (802F9-RO9.9) and Emphysema (802F9-I1R.U).

These results will be called to the ordering clinician or
representative by the Radiologist Assistant, and communication
documented in the PACS or [REDACTED].
